# Patient Record
Sex: Female | Born: 1940 | Race: White | Hispanic: No | Smoking: Former smoker
Health system: Southern US, Community
[De-identification: ages and names within clinical notes are randomized; demographics above are authoritative.]

## PROBLEM LIST (undated history)

## (undated) DIAGNOSIS — F329 Major depressive disorder, single episode, unspecified: Secondary | ICD-10-CM

## (undated) DIAGNOSIS — I10 Essential (primary) hypertension: Secondary | ICD-10-CM

## (undated) DIAGNOSIS — M1711 Unilateral primary osteoarthritis, right knee: Secondary | ICD-10-CM

## (undated) DIAGNOSIS — F32A Depression, unspecified: Secondary | ICD-10-CM

## (undated) DIAGNOSIS — R011 Cardiac murmur, unspecified: Secondary | ICD-10-CM

## (undated) DIAGNOSIS — R112 Nausea with vomiting, unspecified: Secondary | ICD-10-CM

## (undated) DIAGNOSIS — Z9889 Other specified postprocedural states: Secondary | ICD-10-CM

## (undated) DIAGNOSIS — Z96652 Presence of left artificial knee joint: Secondary | ICD-10-CM

## (undated) DIAGNOSIS — E785 Hyperlipidemia, unspecified: Secondary | ICD-10-CM

## (undated) DIAGNOSIS — M1990 Unspecified osteoarthritis, unspecified site: Secondary | ICD-10-CM

## (undated) HISTORY — DX: Unspecified osteoarthritis, unspecified site: M19.90

## (undated) HISTORY — PX: TONSILLECTOMY: SUR1361

## (undated) HISTORY — PX: JOINT REPLACEMENT: SHX530

## (undated) HISTORY — DX: Unilateral primary osteoarthritis, right knee: M17.11

## (undated) HISTORY — PX: REPLACEMENT TOTAL KNEE: SUR1224

## (undated) HISTORY — DX: Essential (primary) hypertension: I10

## (undated) HISTORY — DX: Presence of left artificial knee joint: Z96.652

## (undated) HISTORY — DX: Hyperlipidemia, unspecified: E78.5

---

## 1996-10-14 HISTORY — PX: CHOLECYSTECTOMY: SHX55

## 1998-10-14 HISTORY — PX: BREAST BIOPSY: SHX20

## 2001-10-14 DIAGNOSIS — Z96652 Presence of left artificial knee joint: Secondary | ICD-10-CM

## 2001-10-14 HISTORY — DX: Presence of left artificial knee joint: Z96.652

## 2011-05-31 ENCOUNTER — Ambulatory Visit (INDEPENDENT_AMBULATORY_CARE_PROVIDER_SITE_OTHER): Payer: Medicare Other | Admitting: Family Medicine

## 2011-05-31 ENCOUNTER — Encounter: Payer: Self-pay | Admitting: Family Medicine

## 2011-05-31 VITALS — BP 140/78 | HR 60 | Temp 98.6°F | Wt 172.0 lb

## 2011-05-31 DIAGNOSIS — Z96659 Presence of unspecified artificial knee joint: Secondary | ICD-10-CM

## 2011-05-31 DIAGNOSIS — F329 Major depressive disorder, single episode, unspecified: Secondary | ICD-10-CM

## 2011-05-31 DIAGNOSIS — Z0000 Encounter for general adult medical examination without abnormal findings: Secondary | ICD-10-CM

## 2011-05-31 DIAGNOSIS — I10 Essential (primary) hypertension: Secondary | ICD-10-CM

## 2011-05-31 DIAGNOSIS — M1990 Unspecified osteoarthritis, unspecified site: Secondary | ICD-10-CM

## 2011-05-31 MED ORDER — LISINOPRIL 40 MG PO TABS: 40.0000 mg | ORAL_TABLET | Freq: Every day | ORAL | Status: DC

## 2011-05-31 MED ORDER — ZOSTER VACCINE LIVE 19400 UNT/0.65ML ~~LOC~~ SOLR: 0.6500 mL | Freq: Once | SUBCUTANEOUS | Status: DC

## 2011-05-31 MED ORDER — CITALOPRAM HYDROBROMIDE 10 MG PO TABS: 10.0000 mg | ORAL_TABLET | Freq: Every day | ORAL | Status: DC

## 2011-05-31 MED ORDER — CELECOXIB 200 MG PO CAPS: 200.0000 mg | ORAL_CAPSULE | Freq: Every day | ORAL | Status: DC

## 2011-05-31 MED ORDER — AMOXICILLIN 500 MG PO CAPS: ORAL_CAPSULE | ORAL | Status: DC

## 2011-05-31 MED ORDER — DILTIAZEM HCL ER BEADS 300 MG PO CP24: 300.0000 mg | ORAL_CAPSULE | Freq: Every day | ORAL | Status: DC

## 2011-05-31 NOTE — Progress Notes (Signed)
  Subjective:     Kimberly Edwards is a 70 y.o. female and is here for a comprehensive physical exam. The patient reports no problems.  History   Social History  . Marital Status: Married    Spouse Name: N/A    Number of Children: N/A  . Years of Education: N/A   Occupational History  . retired Interior and spatial designer    Social History Main Topics  . Smoking status: Current Some Day Smoker -- 50 years    Types: Cigarettes  . Smokeless tobacco: Not on file  . Alcohol Use: 0.0 oz/week     <1  . Drug Use: No  . Sexually Active: Not Currently -- Female partner(s)   Other Topics Concern  . Not on file   Social History Narrative  . No narrative on file   No health maintenance topics applied.  The following portions of the patient's history were reviewed and updated as appropriate: allergies, current medications, past family history, past medical history, past social history, past surgical history and problem list.  Review of Systems  Review of Systems  Constitutional: Negative for activity change, appetite change and fatigue.  HENT: Negative for hearing loss, congestion, tinnitus and ear discharge.   dentist q30m Eyes: Negative for visual disturbance (see optho q1y -- vision corrected to 20/20 with glasses).  Respiratory: Negative for cough, chest tightness and shortness of breath.   Cardiovascular: Negative for chest pain, palpitations and leg swelling.  Gastrointestinal: Negative for abdominal pain, diarrhea, constipation and abdominal distention.  Genitourinary: Negative for urgency, frequency, decreased urine volume and difficulty urinating.  Musculoskeletal: Negative for back pain, arthralgias and gait problem.  Skin: Negative for color change, pallor and rash.  Neurological: Negative for dizziness, light-headedness, numbness and headaches.  Hematological: Negative for adenopathy. Does not bruise/bleed easily.  Psychiatric/Behavioral: Negative for suicidal ideas, confusion, sleep  disturbance, self-injury, dysphoric mood, decreased concentration and agitation.  Pt is able to read and write and can do all ADLs No risk for falling No abuse/ violence in home     Objective:    BP 140/78  Pulse 60  Temp(Src) 98.6 F (37 C) (Oral)  Wt 172 lb (78.019 kg)  SpO2 95% General appearance: alert, cooperative, appears stated age and no distress Head: Normocephalic, without obvious abnormality, atraumatic Eyes: conjunctivae/corneas clear. PERRL, EOM's intact. Fundi benign. Ears: normal TM's and external ear canals both ears Nose: Nares normal. Septum midline. Mucosa normal. No drainage or sinus tenderness. Throat: lips, mucosa, and tongue normal; teeth and gums normal Neck: no adenopathy, no JVD, supple, symmetrical, trachea midline and thyroid not enlarged, symmetric, no tenderness/mass/nodules Back: symmetric, no curvature. ROM normal. No CVA tenderness. Lungs: clear to auscultation bilaterally Breasts: deferred until pap is due in Dec Heart: S1, S2 normal and + murmur Abdomen: soft, non-tender; bowel sounds normal; no masses,  no organomegaly Pelvic: gyn Extremities: extremities normal, atraumatic, no cyanosis or edema Pulses: 2+ and symmetric Skin: Skin color, texture, turgor normal. No rashes or lesions Lymph nodes: Cervical, supraclavicular, and axillary nodes normal. Neurologic: Alert and oriented X 3, normal strength and tone. Normal symmetric reflexes. Normal coordination and gait    Assessment:    Healthy female exam.  OA-- f/u ortho HTN-- stable , con't meds Depression--stable con't meds   Plan:  ghm utd Check fasting labs   See After Visit Summary for Counseling Recommendations

## 2011-05-31 NOTE — Patient Instructions (Signed)

## 2011-06-13 ENCOUNTER — Other Ambulatory Visit: Payer: Self-pay | Admitting: Family Medicine

## 2011-06-13 DIAGNOSIS — M1990 Unspecified osteoarthritis, unspecified site: Secondary | ICD-10-CM

## 2011-06-13 DIAGNOSIS — I10 Essential (primary) hypertension: Secondary | ICD-10-CM

## 2011-06-13 DIAGNOSIS — Z96659 Presence of unspecified artificial knee joint: Secondary | ICD-10-CM

## 2011-06-13 DIAGNOSIS — Z0000 Encounter for general adult medical examination without abnormal findings: Secondary | ICD-10-CM

## 2011-06-14 ENCOUNTER — Other Ambulatory Visit (INDEPENDENT_AMBULATORY_CARE_PROVIDER_SITE_OTHER): Payer: Medicare Other

## 2011-06-14 DIAGNOSIS — I10 Essential (primary) hypertension: Secondary | ICD-10-CM

## 2011-06-14 LAB — CBC WITH DIFFERENTIAL/PLATELET
Basophils Absolute: 0.1 10*3/uL (ref 0.0–0.1)
Eosinophils Relative: 2.2 % (ref 0.0–5.0)
HCT: 43.1 % (ref 36.0–46.0)
Lymphs Abs: 3 10*3/uL (ref 0.7–4.0)
MCV: 88.4 fl (ref 78.0–100.0)
Monocytes Absolute: 0.5 10*3/uL (ref 0.1–1.0)
Platelets: 270 10*3/uL (ref 150.0–400.0)
RDW: 13.9 % (ref 11.5–14.6)

## 2011-06-14 LAB — HEPATIC FUNCTION PANEL
ALT: 13 U/L (ref 0–35)
AST: 18 U/L (ref 0–37)
Albumin: 3.7 g/dL (ref 3.5–5.2)
Total Protein: 7.2 g/dL (ref 6.0–8.3)

## 2011-06-14 LAB — BASIC METABOLIC PANEL
CO2: 26 mEq/L (ref 19–32)
Calcium: 9.2 mg/dL (ref 8.4–10.5)
GFR: 88.86 mL/min (ref >60.00)
Sodium: 141 mEq/L (ref 135–145)

## 2011-06-14 LAB — LIPID PANEL
Cholesterol: 173 mg/dL (ref 0–200)
HDL: 49.4 mg/dL (ref >39.00)
Total CHOL/HDL Ratio: 4
Triglycerides: 155 mg/dL — ABNORMAL HIGH (ref 0.0–149.0)

## 2011-06-14 NOTE — Progress Notes (Signed)
Labs only

## 2011-12-09 ENCOUNTER — Ambulatory Visit (INDEPENDENT_AMBULATORY_CARE_PROVIDER_SITE_OTHER): Payer: Medicare Other | Admitting: Family Medicine

## 2011-12-09 ENCOUNTER — Encounter: Payer: Self-pay | Admitting: Family Medicine

## 2011-12-09 VITALS — BP 134/80 | HR 81 | Temp 98.2°F | Ht 58.8 in | Wt 176.6 lb

## 2011-12-09 DIAGNOSIS — I10 Essential (primary) hypertension: Secondary | ICD-10-CM | POA: Insufficient documentation

## 2011-12-09 DIAGNOSIS — Z780 Asymptomatic menopausal state: Secondary | ICD-10-CM

## 2011-12-09 DIAGNOSIS — Z0000 Encounter for general adult medical examination without abnormal findings: Secondary | ICD-10-CM

## 2011-12-09 DIAGNOSIS — Z1239 Encounter for other screening for malignant neoplasm of breast: Secondary | ICD-10-CM

## 2011-12-09 DIAGNOSIS — E785 Hyperlipidemia, unspecified: Secondary | ICD-10-CM

## 2011-12-09 DIAGNOSIS — M1990 Unspecified osteoarthritis, unspecified site: Secondary | ICD-10-CM

## 2011-12-09 DIAGNOSIS — Z96659 Presence of unspecified artificial knee joint: Secondary | ICD-10-CM

## 2011-12-09 DIAGNOSIS — Z1231 Encounter for screening mammogram for malignant neoplasm of breast: Secondary | ICD-10-CM

## 2011-12-09 MED ORDER — CITALOPRAM HYDROBROMIDE 10 MG PO TABS: 10.0000 mg | ORAL_TABLET | Freq: Every day | ORAL | Status: DC

## 2011-12-09 MED ORDER — TRIAMTERENE-HCTZ 37.5-25 MG PO CAPS: 1.0000 | ORAL_CAPSULE | ORAL | Status: DC

## 2011-12-09 MED ORDER — LISINOPRIL 40 MG PO TABS: 40.0000 mg | ORAL_TABLET | Freq: Every day | ORAL | Status: DC

## 2011-12-09 MED ORDER — PRAVASTATIN SODIUM 20 MG PO TABS: 20.0000 mg | ORAL_TABLET | Freq: Every day | ORAL | Status: DC

## 2011-12-09 MED ORDER — AMOXICILLIN 500 MG PO CAPS: ORAL_CAPSULE | ORAL | Status: DC

## 2011-12-09 MED ORDER — CELECOXIB 200 MG PO CAPS: 200.0000 mg | ORAL_CAPSULE | Freq: Every day | ORAL | Status: DC

## 2011-12-09 MED ORDER — DILTIAZEM HCL ER BEADS 300 MG PO CP24: 300.0000 mg | ORAL_CAPSULE | Freq: Every day | ORAL | Status: DC

## 2011-12-09 NOTE — Assessment & Plan Note (Signed)
con't meds  Check labs 

## 2011-12-09 NOTE — Patient Instructions (Addendum)
Hypertension As your heart beats, it forces blood through your arteries. This force is your blood pressure. If the pressure is too high, it is called hypertension (HTN) or high blood pressure. HTN is dangerous because you may have it and not know it. High blood pressure may mean that your heart has to work harder to pump blood. Your arteries may be narrow or stiff. The extra work puts you at risk for heart disease, stroke, and other problems.  Blood pressure consists of two numbers, a higher number over a lower, 110/72, for example. It is stated as "110 over 72." The ideal is below 120 for the top number (systolic) and under 80 for the bottom (diastolic). Write down your blood pressure today. You should pay close attention to your blood pressure if you have certain conditions such as:  Heart failure.   Prior heart attack.   Diabetes   Chronic kidney disease.   Prior stroke.   Multiple risk factors for heart disease.  To see if you have HTN, your blood pressure should be measured while you are seated with your arm held at the level of the heart. It should be measured at least twice. A one-time elevated blood pressure reading (especially in the Emergency Department) does not mean that you need treatment. There may be conditions in which the blood pressure is different between your right and left arms. It is important to see your caregiver soon for a recheck. Most people have essential hypertension which means that there is not a specific cause. This type of high blood pressure may be lowered by changing lifestyle factors such as:  Stress.   Smoking.   Lack of exercise.   Excessive weight.   Drug/tobacco/alcohol use.   Eating less salt.  Most people do not have symptoms from high blood pressure until it has caused damage to the body. Effective treatment can often prevent, delay or reduce that damage. TREATMENT  When a cause has been identified, treatment for high blood pressure is  directed at the cause. There are a large number of medications to treat HTN. These fall into several categories, and your caregiver will help you select the medicines that are best for you. Medications may have side effects. You should review side effects with your caregiver. If your blood pressure stays high after you have made lifestyle changes or started on medicines,   Your medication(s) may need to be changed.   Other problems may need to be addressed.   Be certain you understand your prescriptions, and know how and when to take your medicine.   Be sure to follow up with your caregiver within the time frame advised (usually within two weeks) to have your blood pressure rechecked and to review your medications.   If you are taking more than one medicine to lower your blood pressure, make sure you know how and at what times they should be taken. Taking two medicines at the same time can result in blood pressure that is too low.  SEEK IMMEDIATE MEDICAL CARE IF:  You develop a severe headache, blurred or changing vision, or confusion.   You have unusual weakness or numbness, or a faint feeling.   You have severe chest or abdominal pain, vomiting, or breathing problems.  MAKE SURE YOU:   Understand these instructions.   Will watch your condition.   Will get help right away if you are not doing well or get worse.  Document Released: 09/30/2005 Document Revised: 06/12/2011 Document Reviewed:   05/20/2008 Exit[employer] Patient Information 2012 [employer].Smoking Cessation This document explains the best ways for you to quit smoking and new treatments to help. It lists new medicines that can double or triple your chances of quitting and quitting for good. It also considers ways to avoid relapses and concerns you may have about quitting, including weight gain. NICOTINE: A POWERFUL ADDICTION If you have tried to quit smoking, you know how hard it can be. It is hard because nicotine is a very  addictive drug. For some people, it can be as addictive as heroin or cocaine. Usually, people make 2 or 3 tries, or more, before finally being able to quit. Each time you try to quit, you can learn about what helps and what hurts. Quitting takes hard work and a lot of effort, but you can quit smoking. QUITTING SMOKING IS ONE OF THE MOST IMPORTANT THINGS YOU WILL EVER DO.  You will live longer, feel better, and live better.   The impact on your body of quitting smoking is felt almost immediately:   Within 20 minutes, blood pressure decreases. Pulse returns to its normal level.   After 8 hours, carbon monoxide levels in the blood return to normal. Oxygen level increases.   After 24 hours, chance of heart attack starts to decrease. Breath, hair, and body stop smelling like smoke.   After 48 hours, damaged nerve endings begin to recover. Sense of taste and smell improve.   After 72 hours, the body is virtually free of nicotine. Bronchial tubes relax and breathing becomes easier.   After 2 to 12 weeks, lungs can hold more air. Exercise becomes easier and circulation improves.   Quitting will reduce your risk of having a heart attack, stroke, cancer, or lung disease:   After 1 year, the risk of coronary heart disease is cut in half.   After 5 years, the risk of stroke falls to the same as a nonsmoker.   After 10 years, the risk of lung cancer is cut in half and the risk of other cancers decreases significantly.   After 15 years, the risk of coronary heart disease drops, usually to the level of a nonsmoker.   If you are pregnant, quitting smoking will improve your chances of having a healthy baby.   The people you live with, especially your children, will be healthier.   You will have extra money to spend on things other than cigarettes.  FIVE KEYS TO QUITTING Studies have shown that these 5 steps will help you quit smoking and quit for good. You have the best chances of quitting if you  use them together: 1. Get ready.  2. Get support and encouragement.  3. Learn new skills and behaviors.  4. Get medicine to reduce your nicotine addiction and use it correctly.  5. Be prepared for relapse or difficult situations. Be determined to continue trying to quit, even if you do not succeed at first.  1. GET READY  Set a quit date.   Change your environment.   Get rid of ALL cigarettes, ashtrays, matches, and lighters in your home, car, and place of work.   Do not let people smoke in your home.   Review your past attempts to quit. Think about what worked and what did not.   Once you quit, do not smoke. NOT EVEN A PUFF!  2. GET SUPPORT AND ENCOURAGEMENT Studies have shown that you have a better chance of being successful if you have help. You can   get support in many ways.  Tell your family, friends, and coworkers that you are going to quit and need their support. Ask them not to smoke around you.   Talk to your caregivers (doctor, dentist, nurse, pharmacist, psychologist, and/or smoking counselor).   Get individual, group, or telephone counseling and support. The more counseling you have, the better your chances are of quitting. Programs are available at local hospitals and health centers. Call your local health department for information about programs in your area.   Spiritual beliefs and practices may help some smokers quit.   Quit meters are small computer programs online or downloadable that keep track of quit statistics, such as amount of "quit-time," cigarettes not smoked, and money saved.   Many smokers find one or more of the many self-help books available useful in helping them quit and stay off tobacco.  3. LEARN NEW SKILLS AND BEHAVIORS  Try to distract yourself from urges to smoke. Talk to someone, go for a walk, or occupy your time with a task.   When you first try to quit, change your routine. Take a different route to work. Drink tea instead of coffee. Eat  breakfast in a different place.   Do something to reduce your stress. Take a hot bath, exercise, or read a book.   Plan something enjoyable to do every day. Reward yourself for not smoking.   Explore interactive web-based programs that specialize in helping you quit.  4. GET MEDICINE AND USE IT CORRECTLY Medicines can help you stop smoking and decrease the urge to smoke. Combining medicine with the above behavioral methods and support can quadruple your chances of successfully quitting smoking. The [place] Food and Drug Administration (FDA) has approved 7 medicines to help you quit smoking. These medicines fall into 3 categories.  Nicotine replacement therapy (delivers nicotine to your body without the negative effects and risks of smoking):   Nicotine gum: Available over-the-counter.   Nicotine lozenges: Available over-the-counter.   Nicotine inhaler: Available by prescription.   Nicotine nasal spray: Available by prescription.   Nicotine skin patches (transdermal): Available by prescription and over-the-counter.   Antidepressant medicine (helps people abstain from smoking, but how this works is unknown):   Bupropion sustained-release (SR) tablets: Available by prescription.   Nicotinic receptor partial agonist (simulates the effect of nicotine in your brain):   Varenicline tartrate tablets: Available by prescription.   Ask your caregiver for advice about which medicines to use and how to use them. Carefully read the information on the package.   Everyone who is trying to quit may benefit from using a medicine. If you are pregnant or trying to become pregnant, nursing an infant, you are under age 18, or you smoke fewer than 10 cigarettes per day, talk to your caregiver before taking any nicotine replacement medicines.   You should stop using a nicotine replacement product and call your caregiver if you experience nausea, dizziness, weakness, vomiting, fast or irregular heartbeat,  mouth problems with the lozenge or gum, or redness or swelling of the skin around the patch that does not go away.   Do not use any other product containing nicotine while using a nicotine replacement product.   Talk to your caregiver before using these products if you have diabetes, heart disease, asthma, stomach ulcers, you had a recent heart attack, you have high blood pressure that is not controlled with medicine, a history of irregular heartbeat, or you have been prescribed medicine to help you quit smoking.    5. BE PREPARED FOR RELAPSE OR DIFFICULT SITUATIONS  Most relapses occur within the first 3 months after quitting. Do not be discouraged if you start smoking again. Remember, most people try several times before they finally quit.   You may have symptoms of withdrawal because your body is used to nicotine. You may crave cigarettes, be irritable, feel very hungry, cough often, get headaches, or have difficulty concentrating.   The withdrawal symptoms are only temporary. They are strongest when you first quit, but they will go away within 10 to 14 days.  Here are some difficult situations to watch for:  Alcohol. Avoid drinking alcohol. Drinking lowers your chances of successfully quitting.   Caffeine. Try to reduce the amount of caffeine you consume. It also lowers your chances of successfully quitting.   Other smokers. Being around smoking can make you want to smoke. Avoid smokers.   Weight gain. Many smokers will gain weight when they quit, usually less than 10 pounds. Eat a healthy diet and stay active. Do not let weight gain distract you from your main goal, quitting smoking. Some medicines that help you quit smoking may also help delay weight gain. You can always lose the weight gained after you quit.   Bad mood or depression. There are a lot of ways to improve your mood other than smoking.  If you are having problems with any of these situations, talk to your caregiver. SPECIAL  SITUATIONS AND CONDITIONS Studies suggest that everyone can quit smoking. Your situation or condition can give you a special reason to quit.  Pregnant women/new mothers: By quitting, you protect your baby's health and your own.   Hospitalized patients: By quitting, you reduce health problems and help healing.   Heart attack patients: By quitting, you reduce your risk of a second heart attack.   Lung, head, and neck cancer patients: By quitting, you reduce your chance of a second cancer.   Parents of children and adolescents: By quitting, you protect your children from illnesses caused by secondhand smoke.  QUESTIONS TO THINK ABOUT Think about the following questions before you try to stop smoking. You may want to talk about your answers with your caregiver.  Why do you want to quit?   If you tried to quit in the past, what helped and what did not?   What will be the most difficult situations for you after you quit? How will you plan to handle them?   Who can help you through the tough times? Your family? Friends? Caregiver?   What pleasures do you get from smoking? What ways can you still get pleasure if you quit?  Here are some questions to ask your caregiver:  How can you help me to be successful at quitting?   What medicine do you think would be best for me and how should I take it?   What should I do if I need more help?   What is smoking withdrawal like? How can I get information on withdrawal?  Quitting takes hard work and a lot of effort, but you can quit smoking. FOR MORE INFORMATION  Smokefree.gov (http://www.smokefree.gov) provides free, accurate, evidence-based information and professional assistance to help support the immediate and long-term needs of people trying to quit smoking. Document Released: 09/24/2001 Document Revised: 06/12/2011 Document Reviewed: 07/17/2009 [employer] Patient Information 2012 [employer]. 

## 2011-12-09 NOTE — Progress Notes (Signed)
  Subjective:    Patient ID: Kimberly Edwards, female    DOB: [birth year], 71 y.o.   MRN: [id number]  HPI Pt here for f/u bp and cholesterol.  Pt has no complaints.    Review of Systems As above    Objective:   Physical Exam  Constitutional: She is oriented to person, place, and time. She appears well-developed and well-nourished.  Neck: Normal range of motion. Neck supple.  Cardiovascular: Normal rate, regular rhythm and normal heart sounds.   No murmur heard. Pulmonary/Chest: Effort normal and breath sounds normal. No respiratory distress. She has no wheezes. She has no rales.  Musculoskeletal: She exhibits no edema and no tenderness.  Neurological: She is alert and oriented to person, place, and time.  Psychiatric: She has a normal mood and affect. Her behavior is normal. Judgment and thought content normal.          Assessment & Plan:

## 2011-12-09 NOTE — Assessment & Plan Note (Signed)
Stable , con't meds  

## 2011-12-24 ENCOUNTER — Other Ambulatory Visit (INDEPENDENT_AMBULATORY_CARE_PROVIDER_SITE_OTHER): Payer: Medicare Other

## 2011-12-24 DIAGNOSIS — I10 Essential (primary) hypertension: Secondary | ICD-10-CM

## 2011-12-24 DIAGNOSIS — Z0000 Encounter for general adult medical examination without abnormal findings: Secondary | ICD-10-CM

## 2011-12-24 DIAGNOSIS — E785 Hyperlipidemia, unspecified: Secondary | ICD-10-CM

## 2011-12-24 LAB — LIPID PANEL
Cholesterol: 206 mg/dL — ABNORMAL HIGH (ref 0–200)
HDL: 66.5 mg/dL (ref >39.00)
VLDL: 23.8 mg/dL (ref 0.0–40.0)

## 2011-12-24 LAB — BASIC METABOLIC PANEL
BUN: 29 mg/dL — ABNORMAL HIGH (ref 6–23)
Chloride: 103 mEq/L (ref 96–112)
GFR: 70.71 mL/min (ref >60.00)
Glucose, Bld: 88 mg/dL (ref 70–99)
Potassium: 4.2 mEq/L (ref 3.5–5.1)

## 2011-12-24 LAB — HEPATIC FUNCTION PANEL
Albumin: 3.6 g/dL (ref 3.5–5.2)
Total Protein: 7 g/dL (ref 6.0–8.3)

## 2011-12-25 LAB — VARICELLA ZOSTER ANTIBODY, IGG: Varicella IgG: 3.52 {ISR} — ABNORMAL HIGH

## 2011-12-30 DIAGNOSIS — E785 Hyperlipidemia, unspecified: Secondary | ICD-10-CM

## 2011-12-30 MED ORDER — PRAVASTATIN SODIUM 40 MG PO TABS: 40.0000 mg | ORAL_TABLET | Freq: Every day | ORAL | Status: DC

## 2011-12-31 ENCOUNTER — Ambulatory Visit
Admission: RE | Admit: 2011-12-31 | Discharge: 2011-12-31 | Disposition: A | Discharge status: Home / Self Care | Payer: Medicare Other | Source: Ambulatory Visit | Attending: Family Medicine | Admitting: Family Medicine

## 2011-12-31 DIAGNOSIS — Z780 Asymptomatic menopausal state: Secondary | ICD-10-CM

## 2011-12-31 DIAGNOSIS — Z1231 Encounter for screening mammogram for malignant neoplasm of breast: Secondary | ICD-10-CM

## 2011-12-31 LAB — HM DEXA SCAN: HM Dexa Scan: NORMAL

## 2012-02-17 ENCOUNTER — Telehealth: Payer: Self-pay | Admitting: Family Medicine

## 2012-02-17 NOTE — Telephone Encounter (Signed)
Pt states she is currently walking with a cane and walker and is scheduled to have her knee surgery in June. She is requesting a handicap sticker. Does she need an OV?

## 2012-02-17 NOTE — Telephone Encounter (Signed)
Patient aware form complete and she will pick up in the morning.    KP

## 2012-02-17 NOTE — Telephone Encounter (Signed)
No we can give her application

## 2012-02-21 ENCOUNTER — Encounter: Payer: Self-pay | Admitting: Family Medicine

## 2012-03-04 ENCOUNTER — Ambulatory Visit (INDEPENDENT_AMBULATORY_CARE_PROVIDER_SITE_OTHER): Payer: Medicare Other | Admitting: Family Medicine

## 2012-03-04 ENCOUNTER — Encounter: Payer: Self-pay | Admitting: Family Medicine

## 2012-03-04 VITALS — BP 138/72 | HR 65 | Temp 98.9°F | Wt 175.0 lb

## 2012-03-04 DIAGNOSIS — E785 Hyperlipidemia, unspecified: Secondary | ICD-10-CM

## 2012-03-04 DIAGNOSIS — I10 Essential (primary) hypertension: Secondary | ICD-10-CM

## 2012-03-04 DIAGNOSIS — Z01818 Encounter for other preprocedural examination: Secondary | ICD-10-CM

## 2012-03-04 NOTE — Progress Notes (Signed)
Subjective:    Kimberly Edwards is a 71 y.o. female who presents to the office today for a preoperative consultation at the request of surgeon Dr Thurston Hole who plans on performing R total knee replacement on June 10. This consultation is requested for the specific conditions prompting preoperative evaluation (i.e. because of potential affect on operative risk): htn, hyperlipidemia and age. Planned anesthesia: general. The patient has the following known anesthesia issues: none. Patients bleeding risk: use of Ca-channel blockers (see med list). Patient does not have objections to receiving blood products if needed.  The following portions of the patient's history were reviewed and updated as appropriate: allergies, current medications, past family history, past medical history, past social history, past surgical history and problem list.  Review of Systems Pertinent items are noted in HPI.    Objective:    BP 138/72  Pulse 65  Temp(Src) 98.9 F (37.2 C) (Oral)  Wt 175 lb (79.379 kg)  SpO2 96% General appearance: alert, cooperative, appears stated age and no distress Head: Normocephalic, without obvious abnormality, atraumatic Eyes: conjunctivae/corneas clear. PERRL, EOM&#39;s intact. Fundi benign. Ears: normal TM's and external ear canals both ears Nose: Nares normal. Septum midline. Mucosa normal. No drainage or sinus tenderness. Throat: lips, mucosa, and tongue normal; teeth and gums normal Neck: no adenopathy, no carotid bruit, no JVD, supple, symmetrical, trachea midline and thyroid not enlarged, symmetric, no tenderness/mass/nodules Back: negative Lungs: clear to auscultation bilaterally Heart: regular rate and rhythm, S1, S2 normal, no murmur, click, rub or gallop Abdomen: soft, non-tender; bowel sounds normal; no masses,  no organomegaly Extremities: + swelling in R knee with pain and crepitus Pulses: 2+ and symmetric Skin: Skin color, texture, turgor normal. No rashes or  lesions Lymph nodes: Cervical, supraclavicular, and axillary nodes normal. Neurologic: Alert and oriented X 3, normal strength and tone. Normal symmetric reflexes. Normal coordination and gait  Predictors of intubation difficulty:  Morbid obesity? yes -   Anatomically abnormal facies? no  Prominent incisors? no  Receding mandible? no  Short, thick neck? no  Neck range of motion: normal     Dentition: No chipped, loose, or missing teeth.---+ permanent bridges  Cardiographics ECG: sinus brady Echocardiogram: not done  Imaging Chest x-ray: to be done at hospital   Lab Review  Appointment on 12/24/2011  Component Date Value  . Varicella IgG 12/24/2011 3.52*  . Total Bilirubin 12/24/2011 0.6   . Bilirubin, Direct 12/24/2011 0.1   . Alkaline Phosphatase 12/24/2011 66   . AST 12/24/2011 16   . ALT 12/24/2011 16   . Total Protein 12/24/2011 7.0   . Albumin 12/24/2011 3.6   . Cholesterol 12/24/2011 206*  . Triglycerides 12/24/2011 119.0   . HDL 12/24/2011 66.50   . VLDL 12/24/2011 23.8   . Total CHOL/HDL Ratio 12/24/2011 3   . Sodium 12/24/2011 140   . Potassium 12/24/2011 4.2   . Chloride 12/24/2011 103   . CO2 12/24/2011 29   . Glucose, Bld 12/24/2011 88   . BUN 12/24/2011 29*  . Creatinine, Ser 12/24/2011 0.8   . Calcium 12/24/2011 9.2   . GFR 12/24/2011 70.71   . Direct LDL 12/24/2011 111.1       Assessment:      71 y.o. female with planned surgery as above.   Known risk factors for perioperative complications: None   Difficulty with intubation is not anticipated.  Cardiac Risk Estimation: low  Current medications which may produce withdrawal symptoms if withheld perioperatively: none  Plan:    1. Preoperative workup as follows chest x-ray, ECG, hemoglobin, hematocrit, electrolytes, creatinine, glucose, liver function studies, coagulation studies. 2. Change in medication regimen before surgery: stop aspirin 1 week before surgery,  no alcohol 2 days before,   stop fish oil and she will take all other meds with her to hospital--per pt. 3. Prophylaxis for cardiac events with perioperative beta-blockers:per anasthesia 4. Invasive hemodynamic monitoring perioperatively: at the discretion of anesthesiologist. 5. Deep vein thrombosis prophylaxis postoperatively:regimen to be chosen by surgical team. 6. Surveillance for postoperative MI with ECG immediately postoperatively and on postoperative days 1 and 2 AND troponin levels 24 hours postoperatively and on day 4 or hospital discharge (whichever comes first): at the discretion of anesthesiologist. 7. Other measures: consultation of hospital service if medicine referral is needed

## 2012-03-06 ENCOUNTER — Ambulatory Visit: Payer: Medicare Other | Admitting: Family Medicine

## 2012-03-10 ENCOUNTER — Other Ambulatory Visit: Payer: Self-pay | Admitting: Physician Assistant

## 2012-03-10 ENCOUNTER — Encounter: Payer: Self-pay | Admitting: Physician Assistant

## 2012-03-10 DIAGNOSIS — Z96652 Presence of left artificial knee joint: Secondary | ICD-10-CM

## 2012-03-10 DIAGNOSIS — I10 Essential (primary) hypertension: Secondary | ICD-10-CM

## 2012-03-10 DIAGNOSIS — M1711 Unilateral primary osteoarthritis, right knee: Secondary | ICD-10-CM

## 2012-03-10 DIAGNOSIS — M1990 Unspecified osteoarthritis, unspecified site: Secondary | ICD-10-CM

## 2012-03-10 NOTE — H&P (Signed)
Kimberly Edwards is an 71 y.o. female.   Chief Complaint: right knee DJD HPI: Kimberly Edwards is seen for increasing right knee pain where she has known degenerative joint disease. She was scheduled for a total knee replacement in July but her pain has gotten to the point that she can barely walk. We saw her 2 weeks ago and discussed the need for total knee replacement because she has no remaining joint space in her right knee on x-rays and she now needs an assistive device to walk. She has an appointment with her primary care physician Dr. Lowne coming up. Her pain is severe and her gait is severely compromised due to this.   Past Medical History  Diagnosis Date  . Hypertension   . Hyperlipidemia   . Arthritis   . Right knee DJD   . Knee joint replacement status, left 2003    Past Surgical History  Procedure Date  . Cholecystectomy 1998  . Replacement total knee     left knee  . Breast biopsy 2000    marker put in    Family History  Problem Relation Age of Onset  . Breast cancer Sister   . Hypertension Father    Social History:  reports that she has been smoking Cigarettes.  She has a 12.5 pack-year smoking history. She does not have any smokeless tobacco history on file. She reports that she drinks alcohol. She reports that she does not use illicit drugs.  Allergies: No Known Allergies  Current Outpatient Prescriptions on File Prior to Visit  Medication Sig Dispense Refill  . aspirin 81 MG EC tablet Take 81 mg by mouth daily.        . celecoxib (CELEBREX) 200 MG capsule Take 1 capsule (200 mg total) by mouth daily.  30 capsule  11  . citalopram (CELEXA) 10 MG tablet Take 1 tablet (10 mg total) by mouth daily.  30 tablet  11  . diltiazem (TAZTIA XT) 300 MG 24 hr capsule Take 1 capsule (300 mg total) by mouth daily.  30 capsule  5  . Glucosamine HCl 1500 MG TABS Take 2 tablets by mouth daily.       . lisinopril (PRINIVIL,ZESTRIL) 40 MG tablet Take 1 tablet (40 mg total) by mouth daily.   30 tablet  5  . pravastatin (PRAVACHOL) 40 MG tablet Take 1 tablet (40 mg total) by mouth daily.  30 tablet  2  . triamterene-hydrochlorothiazide (DYAZIDE) 37.5-25 MG per capsule Take 1 each (1 capsule total) by mouth every morning.  30 capsule  5  . amoxicillin (AMOXIL) 500 MG capsule As directed prior to dental cleaning  30 capsule  0  . zoster vaccine live, PF, (ZOSTAVAX) 19400 UNT/0.65ML injection Inject 19,400 Units into the skin once.  1 vial  0    No results found for this or any previous visit (from the past 48 hour(s)). No results found.  Review of Systems  Constitutional: Negative.   HENT: Negative.   Eyes: Negative.   Respiratory: Negative.   Cardiovascular: Negative.   Gastrointestinal: Negative.   Genitourinary: Negative.   Musculoskeletal: Positive for joint pain.       Right knee  Skin: Negative.   Neurological: Negative.   Endo/Heme/Allergies: Negative.   Psychiatric/Behavioral: Negative.     Blood pressure 120/76, pulse 65, temperature 98.4 F (36.9 C), height 4' 10" (1.473 m), weight 81.194 kg (179 lb), SpO2 94.00%. Physical Exam  Constitutional: She is oriented to person, place, and time. She   appears well-developed and well-nourished.  HENT:  Head: Normocephalic and atraumatic.  Mouth/Throat: Oropharynx is clear and moist.  Eyes: EOM are normal. Pupils are equal, round, and reactive to light.  Neck: Normal range of motion. Neck supple.  Cardiovascular: Normal rate and regular rhythm.   Respiratory: Effort normal.  GI: Soft.  Musculoskeletal:       Height 4'10" weight 175 pounds. Respirations are 12 and unlabored. Examination of her right knee reveals valgus deformity with 1 to 2+ effusion, range of motion -5 to 115 degrees with significant pain no instability with normal patella tracking. Left knee reveals well healed total knee incision without swelling or pain, range of motion 0-115 degrees knee is stable with normal patella tracking.   Neurological: She  is alert and oriented to person, place, and time.  Skin: Skin is warm and dry.  Psychiatric: She has a normal mood and affect. Her behavior is normal. Judgment and thought content normal.    X-RAYS: X-rays of her right knee standing AP lateral flexion and sunrise views show significant joint space narrowing with no joint space remaining in the lateral compartment with significant lateral erosion and medial compartment loss of all joint space as well with significant valgus deformity.  Assessment Patient Active Problem List  Diagnoses  . Osteoarthritis  . HTN (hypertension)  . Hyperlipidemia  . Hypertension  . Arthritis  . Knee joint replacement status, left  . Right knee DJD    Plan I talk to her about this in detail. She has failed conservative care. She needs assistive devices to walk and we give her a prescription for a walker and a cane. Place her in a J&J brace. She has preoperative clearance from Dr. Lowne. Discussed risks benefits and possible complications of the surgery in detail and she understands this completely.   Kimberly Edwards J 03/10/2012, 3:31 PM    

## 2012-03-11 ENCOUNTER — Inpatient Hospital Stay (HOSPITAL_COMMUNITY): Admission: RE | Admit: 2012-03-11 | Discharge: 2012-03-11 | Payer: Medicare Other | Source: Ambulatory Visit

## 2012-03-11 ENCOUNTER — Encounter (HOSPITAL_COMMUNITY): Payer: Self-pay

## 2012-03-11 HISTORY — DX: Other specified postprocedural states: Z98.890

## 2012-03-11 HISTORY — DX: Depression, unspecified: F32.A

## 2012-03-11 HISTORY — DX: Major depressive disorder, single episode, unspecified: F32.9

## 2012-03-11 HISTORY — DX: Nausea with vomiting, unspecified: R11.2

## 2012-03-11 HISTORY — DX: Cardiac murmur, unspecified: R01.1

## 2012-03-12 ENCOUNTER — Encounter (HOSPITAL_COMMUNITY): Payer: Self-pay | Admitting: Pharmacist

## 2012-03-16 ENCOUNTER — Ambulatory Visit: Payer: Medicare Other | Admitting: Family Medicine

## 2012-03-17 ENCOUNTER — Encounter (HOSPITAL_COMMUNITY)
Admission: RE | Admit: 2012-03-17 | Discharge: 2012-03-17 | Disposition: A | Discharge status: Home / Self Care | Payer: Medicare Other | Source: Ambulatory Visit | Attending: Orthopedic Surgery | Admitting: Orthopedic Surgery

## 2012-03-17 ENCOUNTER — Encounter (HOSPITAL_COMMUNITY)
Admission: RE | Admit: 2012-03-17 | Discharge: 2012-03-17 | Disposition: A | Discharge status: Home / Self Care | Payer: Medicare Other | Source: Ambulatory Visit | Attending: Physician Assistant | Admitting: Physician Assistant

## 2012-03-17 LAB — DIFFERENTIAL
Lymphs Abs: 3.7 10*3/uL (ref 0.7–4.0)
Monocytes Relative: 7 % (ref 3–12)
Neutro Abs: 7.1 10*3/uL (ref 1.7–7.7)
Neutrophils Relative %: 60 % (ref 43–77)

## 2012-03-17 LAB — PROTIME-INR
INR: 0.98 (ref 0.00–1.49)
Prothrombin Time: 13.2 seconds (ref 11.6–15.2)

## 2012-03-17 LAB — COMPREHENSIVE METABOLIC PANEL
Albumin: 3.6 g/dL (ref 3.5–5.2)
BUN: 22 mg/dL (ref 6–23)
Calcium: 9.6 mg/dL (ref 8.4–10.5)
Creatinine, Ser: 0.73 mg/dL (ref 0.50–1.10)
GFR calc Af Amer: >90 mL/min (ref >90)
Glucose, Bld: 93 mg/dL (ref 70–99)
Total Protein: 7.5 g/dL (ref 6.0–8.3)

## 2012-03-17 LAB — URINALYSIS, ROUTINE W REFLEX MICROSCOPIC
Bilirubin Urine: NEGATIVE
Glucose, UA: NEGATIVE mg/dL
Ketones, ur: NEGATIVE mg/dL
Nitrite: POSITIVE — AB
Protein, ur: NEGATIVE mg/dL
pH: 6.5 (ref 5.0–8.0)

## 2012-03-17 LAB — URINE MICROSCOPIC-ADD ON

## 2012-03-17 LAB — TYPE AND SCREEN: ABO/RH(D): B POS

## 2012-03-17 LAB — CBC
HCT: 44 % (ref 36.0–46.0)
MCH: 29.7 pg (ref 26.0–34.0)
MCHC: 33.6 g/dL (ref 30.0–36.0)
MCV: 88.2 fL (ref 78.0–100.0)
RDW: 13.2 % (ref 11.5–15.5)

## 2012-03-17 LAB — APTT: aPTT: 32 seconds (ref 24–37)

## 2012-03-17 MED ORDER — CHLORHEXIDINE GLUCONATE 4 % EX LIQD: 60.0000 mL | Freq: Once | CUTANEOUS | Status: DC

## 2012-03-19 ENCOUNTER — Other Ambulatory Visit: Payer: Self-pay | Admitting: Family Medicine

## 2012-03-19 LAB — URINE CULTURE
Colony Count: >=100000
Culture  Setup Time: 201306041224

## 2012-03-22 MED ORDER — CEFAZOLIN SODIUM-DEXTROSE 2-3 GM-% IV SOLR
2.0000 g | INTRAVENOUS | Status: AC
  Administered 2012-03-23: 2 g via INTRAVENOUS

## 2012-03-23 ENCOUNTER — Encounter (HOSPITAL_COMMUNITY): Payer: Self-pay | Admitting: *Deleted

## 2012-03-23 ENCOUNTER — Inpatient Hospital Stay (HOSPITAL_COMMUNITY)
Admission: RE | Admit: 2012-03-23 | Discharge: 2012-03-25 | DRG: 470 | Disposition: A | Discharge status: Skilled Nursing Facility | Payer: Medicare Other | Source: Ambulatory Visit | Attending: Orthopedic Surgery | Admitting: Orthopedic Surgery

## 2012-03-23 ENCOUNTER — Encounter (HOSPITAL_COMMUNITY): Payer: Self-pay | Admitting: Certified Registered"

## 2012-03-23 ENCOUNTER — Encounter (HOSPITAL_COMMUNITY)
Admission: RE | Disposition: A | Discharge status: Skilled Nursing Facility | Payer: Self-pay | Source: Ambulatory Visit | Attending: Orthopedic Surgery

## 2012-03-23 ENCOUNTER — Ambulatory Visit (HOSPITAL_COMMUNITY): Payer: Medicare Other | Admitting: Certified Registered"

## 2012-03-23 DIAGNOSIS — M1990 Unspecified osteoarthritis, unspecified site: Secondary | ICD-10-CM | POA: Insufficient documentation

## 2012-03-23 DIAGNOSIS — Z7901 Long term (current) use of anticoagulants: Secondary | ICD-10-CM

## 2012-03-23 DIAGNOSIS — Z01812 Encounter for preprocedural laboratory examination: Secondary | ICD-10-CM

## 2012-03-23 DIAGNOSIS — A498 Other bacterial infections of unspecified site: Secondary | ICD-10-CM | POA: Diagnosis present

## 2012-03-23 DIAGNOSIS — M1711 Unilateral primary osteoarthritis, right knee: Secondary | ICD-10-CM | POA: Insufficient documentation

## 2012-03-23 DIAGNOSIS — E785 Hyperlipidemia, unspecified: Secondary | ICD-10-CM | POA: Insufficient documentation

## 2012-03-23 DIAGNOSIS — Z96659 Presence of unspecified artificial knee joint: Secondary | ICD-10-CM

## 2012-03-23 DIAGNOSIS — M1710 Unilateral primary osteoarthritis, unspecified knee: Principal | ICD-10-CM | POA: Diagnosis present

## 2012-03-23 DIAGNOSIS — D62 Acute posthemorrhagic anemia: Secondary | ICD-10-CM | POA: Diagnosis not present

## 2012-03-23 DIAGNOSIS — R112 Nausea with vomiting, unspecified: Secondary | ICD-10-CM

## 2012-03-23 DIAGNOSIS — I10 Essential (primary) hypertension: Secondary | ICD-10-CM | POA: Insufficient documentation

## 2012-03-23 DIAGNOSIS — Z79899 Other long term (current) drug therapy: Secondary | ICD-10-CM

## 2012-03-23 DIAGNOSIS — F17200 Nicotine dependence, unspecified, uncomplicated: Secondary | ICD-10-CM | POA: Diagnosis present

## 2012-03-23 DIAGNOSIS — N390 Urinary tract infection, site not specified: Secondary | ICD-10-CM | POA: Diagnosis present

## 2012-03-23 HISTORY — PX: TOTAL KNEE ARTHROPLASTY: SHX125

## 2012-03-23 SURGERY — ARTHROPLASTY, KNEE, TOTAL
Anesthesia: Regional | Site: Knee | Laterality: Right | Wound class: Clean

## 2012-03-23 MED ORDER — ACETAMINOPHEN 650 MG RE SUPP: 650.0000 mg | Freq: Four times a day (QID) | RECTAL | Status: DC | PRN

## 2012-03-23 MED ORDER — ONDANSETRON HCL 4 MG/2ML IJ SOLN
4.0000 mg | Freq: Four times a day (QID) | INTRAMUSCULAR | Status: DC | PRN
  Administered 2012-03-24: 4 mg via INTRAVENOUS

## 2012-03-23 MED ORDER — HYDROMORPHONE HCL PF 1 MG/ML IJ SOLN
0.2500 mg | INTRAMUSCULAR | Status: DC | PRN
  Administered 2012-03-23 (×4): 0.5 mg via INTRAVENOUS

## 2012-03-23 MED ORDER — BUPIVACAINE-EPINEPHRINE 0.25% -1:200000 IJ SOLN
INTRAMUSCULAR | Status: DC | PRN
  Administered 2012-03-23: 30 mL

## 2012-03-23 MED ORDER — DILTIAZEM HCL ER BEADS 300 MG PO CP24
300.0000 mg | ORAL_CAPSULE | Freq: Every day | ORAL | Status: DC
  Administered 2012-03-24 – 2012-03-25 (×2): 300 mg via ORAL
  Filled 2012-03-23 (×2): qty 1

## 2012-03-23 MED ORDER — DROPERIDOL 2.5 MG/ML IJ SOLN
INTRAMUSCULAR | Status: DC | PRN
  Administered 2012-03-23: 0.625 mg via INTRAVENOUS

## 2012-03-23 MED ORDER — METOCLOPRAMIDE HCL 10 MG PO TABS: 5.0000 mg | ORAL_TABLET | Freq: Three times a day (TID) | ORAL | Status: DC | PRN

## 2012-03-23 MED ORDER — BUPIVACAINE-EPINEPHRINE PF 0.5-1:200000 % IJ SOLN
INTRAMUSCULAR | Status: DC | PRN
  Administered 2012-03-23: 30 mL

## 2012-03-23 MED ORDER — PHENOL 1.4 % MT LIQD: 1.0000 | OROMUCOSAL | Status: DC | PRN

## 2012-03-23 MED ORDER — LACTATED RINGERS IV SOLN: INTRAVENOUS | Status: DC

## 2012-03-23 MED ORDER — MIDAZOLAM HCL 5 MG/5ML IJ SOLN
INTRAMUSCULAR | Status: DC | PRN
  Administered 2012-03-23: 2 mg via INTRAVENOUS

## 2012-03-23 MED ORDER — CITALOPRAM HYDROBROMIDE 10 MG PO TABS
10.0000 mg | ORAL_TABLET | Freq: Every day | ORAL | Status: DC
  Administered 2012-03-24 – 2012-03-25 (×2): 10 mg via ORAL
  Filled 2012-03-23 (×2): qty 1

## 2012-03-23 MED ORDER — HYDROMORPHONE HCL PF 1 MG/ML IJ SOLN
INTRAMUSCULAR | Status: AC
  Filled 2012-03-23: qty 1

## 2012-03-23 MED ORDER — POVIDONE-IODINE 7.5 % EX SOLN
Freq: Once | CUTANEOUS | Status: DC
  Filled 2012-03-23: qty 118

## 2012-03-23 MED ORDER — METOCLOPRAMIDE HCL 5 MG/ML IJ SOLN
5.0000 mg | Freq: Three times a day (TID) | INTRAMUSCULAR | Status: DC | PRN
  Administered 2012-03-25: 10 mg via INTRAVENOUS
  Filled 2012-03-23 (×2): qty 2

## 2012-03-23 MED ORDER — SODIUM CHLORIDE 0.9 % IR SOLN
Status: DC | PRN
  Administered 2012-03-23: 1000 mL

## 2012-03-23 MED ORDER — ACETAMINOPHEN 10 MG/ML IV SOLN
INTRAVENOUS | Status: AC
  Filled 2012-03-23: qty 100

## 2012-03-23 MED ORDER — CHLORHEXIDINE GLUCONATE 4 % EX LIQD
60.0000 mL | Freq: Once | CUTANEOUS | Status: DC
Start: 2012-03-23 — End: 2012-03-23

## 2012-03-23 MED ORDER — ENOXAPARIN SODIUM 30 MG/0.3ML ~~LOC~~ SOLN
30.0000 mg | Freq: Two times a day (BID) | SUBCUTANEOUS | Status: DC
  Administered 2012-03-24 – 2012-03-25 (×3): 30 mg via SUBCUTANEOUS
  Filled 2012-03-23 (×5): qty 0.3

## 2012-03-23 MED ORDER — MENTHOL 3 MG MT LOZG: 1.0000 | LOZENGE | OROMUCOSAL | Status: DC | PRN

## 2012-03-23 MED ORDER — DEXAMETHASONE SODIUM PHOSPHATE 4 MG/ML IJ SOLN
INTRAMUSCULAR | Status: DC | PRN
  Administered 2012-03-23: 4 mg via INTRAVENOUS

## 2012-03-23 MED ORDER — LIDOCAINE HCL (CARDIAC) 20 MG/ML IV SOLN
INTRAVENOUS | Status: DC | PRN
  Administered 2012-03-23: 60 mg via INTRAVENOUS

## 2012-03-23 MED ORDER — POTASSIUM CHLORIDE IN NACL 20-0.9 MEQ/L-% IV SOLN
INTRAVENOUS | Status: DC
  Administered 2012-03-23 – 2012-03-24 (×2): via INTRAVENOUS
  Filled 2012-03-23 (×7): qty 1000

## 2012-03-23 MED ORDER — SIMVASTATIN 20 MG PO TABS: 20.0000 mg | ORAL_TABLET | Freq: Every day | ORAL | Status: DC

## 2012-03-23 MED ORDER — CEFAZOLIN SODIUM-DEXTROSE 2-3 GM-% IV SOLR
INTRAVENOUS | Status: AC
  Filled 2012-03-23: qty 50

## 2012-03-23 MED ORDER — ADULT MULTIVITAMIN W/MINERALS CH
1.0000 | ORAL_TABLET | Freq: Every day | ORAL | Status: DC
  Administered 2012-03-24: 1 via ORAL
  Filled 2012-03-23 (×3): qty 1

## 2012-03-23 MED ORDER — PROMETHAZINE HCL 25 MG/ML IJ SOLN: 6.2500 mg | Freq: Once | INTRAMUSCULAR | Status: DC

## 2012-03-23 MED ORDER — CEFAZOLIN SODIUM-DEXTROSE 2-3 GM-% IV SOLR
2.0000 g | Freq: Four times a day (QID) | INTRAVENOUS | Status: AC
  Administered 2012-03-23 (×2): 2 g via INTRAVENOUS
  Filled 2012-03-23 (×3): qty 50

## 2012-03-23 MED ORDER — FENTANYL CITRATE 0.05 MG/ML IJ SOLN
INTRAMUSCULAR | Status: DC | PRN
  Administered 2012-03-23 (×2): 25 ug via INTRAVENOUS
  Administered 2012-03-23: 50 ug via INTRAVENOUS
  Administered 2012-03-23: 25 ug via INTRAVENOUS
  Administered 2012-03-23 (×2): 50 ug via INTRAVENOUS
  Administered 2012-03-23: 100 ug via INTRAVENOUS
  Administered 2012-03-23: 50 ug via INTRAVENOUS
  Administered 2012-03-23: 75 ug via INTRAVENOUS

## 2012-03-23 MED ORDER — CEFUROXIME SODIUM 1.5 G IJ SOLR
INTRAMUSCULAR | Status: DC | PRN
  Administered 2012-03-23: 1.5 g

## 2012-03-23 MED ORDER — SUCCINYLCHOLINE CHLORIDE 20 MG/ML IJ SOLN
INTRAMUSCULAR | Status: DC | PRN
  Administered 2012-03-23: 100 mg via INTRAVENOUS

## 2012-03-23 MED ORDER — PROPOFOL 10 MG/ML IV BOLUS
INTRAVENOUS | Status: DC | PRN
  Administered 2012-03-23: 150 mg via INTRAVENOUS

## 2012-03-23 MED ORDER — LISINOPRIL 40 MG PO TABS
40.0000 mg | ORAL_TABLET | Freq: Every day | ORAL | Status: DC
  Administered 2012-03-23 – 2012-03-25 (×2): 40 mg via ORAL
  Filled 2012-03-23 (×3): qty 1

## 2012-03-23 MED ORDER — ATORVASTATIN CALCIUM 10 MG PO TABS
10.0000 mg | ORAL_TABLET | Freq: Every day | ORAL | Status: DC
  Administered 2012-03-23 – 2012-03-24 (×2): 10 mg via ORAL
  Filled 2012-03-23 (×3): qty 1

## 2012-03-23 MED ORDER — MULTI-VITAMIN/MINERALS PO TABS: 1.0000 | ORAL_TABLET | Freq: Every day | ORAL | Status: DC

## 2012-03-23 MED ORDER — ONDANSETRON HCL 4 MG PO TABS
4.0000 mg | ORAL_TABLET | Freq: Four times a day (QID) | ORAL | Status: DC | PRN
  Administered 2012-03-23: 4 mg via ORAL
  Filled 2012-03-23 (×2): qty 1

## 2012-03-23 MED ORDER — SORBITOL 70 % SOLN
30.0000 mL | Freq: Two times a day (BID) | Status: DC
  Administered 2012-03-23 – 2012-03-24 (×3): 30 mL via ORAL
  Filled 2012-03-23 (×5): qty 30

## 2012-03-23 MED ORDER — HYDROMORPHONE HCL PF 1 MG/ML IJ SOLN
0.5000 mg | INTRAMUSCULAR | Status: DC | PRN
Start: 2012-03-23 — End: 2012-03-25
  Administered 2012-03-24 – 2012-03-25 (×2): 1 mg via INTRAVENOUS
  Filled 2012-03-23 (×2): qty 1

## 2012-03-23 MED ORDER — ACETAMINOPHEN 10 MG/ML IV SOLN
INTRAVENOUS | Status: DC | PRN
  Administered 2012-03-23: 1000 mg via INTRAVENOUS

## 2012-03-23 MED ORDER — CELECOXIB 200 MG PO CAPS
200.0000 mg | ORAL_CAPSULE | Freq: Every day | ORAL | Status: DC
  Administered 2012-03-23 – 2012-03-25 (×3): 200 mg via ORAL
  Filled 2012-03-23 (×3): qty 1

## 2012-03-23 MED ORDER — OXYCODONE HCL 5 MG PO TABS
5.0000 mg | ORAL_TABLET | ORAL | Status: DC | PRN
  Administered 2012-03-23 (×2): 10 mg via ORAL
  Administered 2012-03-24: 5 mg via ORAL
  Administered 2012-03-24 – 2012-03-25 (×5): 10 mg via ORAL
  Filled 2012-03-23: qty 1
  Filled 2012-03-23 (×4): qty 2
  Filled 2012-03-23: qty 1
  Filled 2012-03-23 (×3): qty 2

## 2012-03-23 MED ORDER — DOCUSATE SODIUM 100 MG PO CAPS
100.0000 mg | ORAL_CAPSULE | Freq: Two times a day (BID) | ORAL | Status: DC
  Administered 2012-03-23 – 2012-03-25 (×4): 100 mg via ORAL
  Filled 2012-03-23 (×6): qty 1

## 2012-03-23 MED ORDER — LACTATED RINGERS IV SOLN
INTRAVENOUS | Status: DC | PRN
  Administered 2012-03-23 (×2): via INTRAVENOUS

## 2012-03-23 MED ORDER — ONDANSETRON HCL 4 MG/2ML IJ SOLN
INTRAMUSCULAR | Status: DC | PRN
  Administered 2012-03-23: 4 mg via INTRAVENOUS

## 2012-03-23 MED ORDER — ACETAMINOPHEN 325 MG PO TABS
650.0000 mg | ORAL_TABLET | Freq: Four times a day (QID) | ORAL | Status: DC | PRN
  Administered 2012-03-25: 650 mg via ORAL
  Filled 2012-03-23: qty 2

## 2012-03-23 SURGICAL SUPPLY — 72 items
BANDAGE ESMARK 6X9 LF (GAUZE/BANDAGES/DRESSINGS) ×1 IMPLANT
BLADE SAGITTAL 25.0X1.19X90 (BLADE) ×2 IMPLANT
BLADE SAW SGTL 11.0X1.19X90.0M (BLADE) IMPLANT
BLADE SAW SGTL 13.0X1.19X90.0M (BLADE) ×2 IMPLANT
BLADE SURG 10 STRL SS (BLADE) ×4 IMPLANT
BNDG ELASTIC 6X15 VLCR STRL LF (GAUZE/BANDAGES/DRESSINGS) ×2 IMPLANT
BNDG ESMARK 6X9 LF (GAUZE/BANDAGES/DRESSINGS) ×2
BOWL SMART MIX CTS (DISPOSABLE) ×2 IMPLANT
CEMENT HV SMART SET (Cement) ×4 IMPLANT
CLOTH BEACON ORANGE TIMEOUT ST (SAFETY) ×2 IMPLANT
CLSR STERI-STRIP ANTIMIC 1/2X4 (GAUZE/BANDAGES/DRESSINGS) ×2 IMPLANT
COVER BACK TABLE 24X17X13 BIG (DRAPES) IMPLANT
COVER PROBE W GEL 5X96 (DRAPES) IMPLANT
COVER SURGICAL LIGHT HANDLE (MISCELLANEOUS) ×2 IMPLANT
CUFF TOURNIQUET SINGLE 34IN LL (TOURNIQUET CUFF) ×2 IMPLANT
CUFF TOURNIQUET SINGLE 44IN (TOURNIQUET CUFF) IMPLANT
DRAPE EXTREMITY T 121X128X90 (DRAPE) ×2 IMPLANT
DRAPE INCISE IOBAN 66X45 STRL (DRAPES) ×2 IMPLANT
DRAPE PROXIMA HALF (DRAPES) ×2 IMPLANT
DRAPE U-SHAPE 47X51 STRL (DRAPES) ×2 IMPLANT
DRSG ADAPTIC 3X8 NADH LF (GAUZE/BANDAGES/DRESSINGS) IMPLANT
DRSG PAD ABDOMINAL 8X10 ST (GAUZE/BANDAGES/DRESSINGS) IMPLANT
DURAPREP 26ML APPLICATOR (WOUND CARE) ×2 IMPLANT
ELECT CAUTERY BLADE 6.4 (BLADE) ×2 IMPLANT
ELECT REM PT RETURN 9FT ADLT (ELECTROSURGICAL) ×2
ELECTRODE REM PT RTRN 9FT ADLT (ELECTROSURGICAL) ×1 IMPLANT
EVACUATOR 1/8 PVC DRAIN (DRAIN) IMPLANT
FACESHIELD LNG OPTICON STERILE (SAFETY) ×2 IMPLANT
GLOVE BIO SURGEON STRL SZ7 (GLOVE) ×2 IMPLANT
GLOVE BIOGEL PI IND STRL 6.5 (GLOVE) ×1 IMPLANT
GLOVE BIOGEL PI IND STRL 7.0 (GLOVE) ×2 IMPLANT
GLOVE BIOGEL PI IND STRL 7.5 (GLOVE) ×1 IMPLANT
GLOVE BIOGEL PI INDICATOR 6.5 (GLOVE) ×1
GLOVE BIOGEL PI INDICATOR 7.0 (GLOVE) ×2
GLOVE BIOGEL PI INDICATOR 7.5 (GLOVE) ×1
GLOVE SS BIOGEL STRL SZ 6.5 (GLOVE) ×1 IMPLANT
GLOVE SS BIOGEL STRL SZ 7.5 (GLOVE) ×1 IMPLANT
GLOVE SUPERSENSE BIOGEL SZ 6.5 (GLOVE) ×1
GLOVE SUPERSENSE BIOGEL SZ 7.5 (GLOVE) ×1
GLOVE SURG SS PI 7.0 STRL IVOR (GLOVE) ×4 IMPLANT
GOWN PREVENTION PLUS XLARGE (GOWN DISPOSABLE) ×4 IMPLANT
GOWN STRL NON-REIN LRG LVL3 (GOWN DISPOSABLE) ×4 IMPLANT
HANDPIECE INTERPULSE COAX TIP (DISPOSABLE) ×1
HOOD PEEL AWAY FACE SHEILD DIS (HOOD) ×4 IMPLANT
IMMOBILIZER KNEE 22 UNIV (SOFTGOODS) ×2 IMPLANT
INSERT CUSHION PRONEVIEW LG (MISCELLANEOUS) ×2 IMPLANT
KIT BASIN OR (CUSTOM PROCEDURE TRAY) ×2 IMPLANT
KIT ROOM TURNOVER OR (KITS) ×2 IMPLANT
MANIFOLD NEPTUNE II (INSTRUMENTS) ×2 IMPLANT
NS IRRIG 1000ML POUR BTL (IV SOLUTION) ×2 IMPLANT
PACK TOTAL JOINT (CUSTOM PROCEDURE TRAY) ×2 IMPLANT
PAD ARMBOARD 7.5X6 YLW CONV (MISCELLANEOUS) ×4 IMPLANT
PAD CAST 4YDX4 CTTN HI CHSV (CAST SUPPLIES) IMPLANT
PADDING CAST COTTON 4X4 STRL (CAST SUPPLIES)
PADDING CAST COTTON 6X4 STRL (CAST SUPPLIES) IMPLANT
POSITIONER HEAD PRONE TRACH (MISCELLANEOUS) ×2 IMPLANT
RUBBERBAND STERILE (MISCELLANEOUS) ×2 IMPLANT
SET HNDPC FAN SPRY TIP SCT (DISPOSABLE) ×1 IMPLANT
SPONGE GAUZE 4X4 12PLY (GAUZE/BANDAGES/DRESSINGS) IMPLANT
STRIP CLOSURE SKIN 1/2X4 (GAUZE/BANDAGES/DRESSINGS) IMPLANT
SUCTION FRAZIER TIP 10 FR DISP (SUCTIONS) ×2 IMPLANT
SUT MNCRL AB 3-0 PS2 18 (SUTURE) ×2 IMPLANT
SUT VIC AB 0 CT1 27 (SUTURE) ×2
SUT VIC AB 0 CT1 27XBRD ANBCTR (SUTURE) ×2 IMPLANT
SUT VIC AB 2-0 CT1 27 (SUTURE) ×2
SUT VIC AB 2-0 CT1 TAPERPNT 27 (SUTURE) ×2 IMPLANT
SUT VLOC 180 0 24IN GS25 (SUTURE) ×2 IMPLANT
SYR 30ML SLIP (SYRINGE) ×2 IMPLANT
TOWEL OR 17X24 6PK STRL BLUE (TOWEL DISPOSABLE) ×2 IMPLANT
TOWEL OR 17X26 10 PK STRL BLUE (TOWEL DISPOSABLE) ×2 IMPLANT
TRAY FOLEY CATH 14FR (SET/KITS/TRAYS/PACK) ×2 IMPLANT
WATER STERILE IRR 1000ML POUR (IV SOLUTION) ×4 IMPLANT

## 2012-03-23 NOTE — Anesthesia Preprocedure Evaluation (Addendum)
Anesthesia Evaluation  Patient identified by MRN, date of birth, ID band Patient awake    Reviewed: Allergy & Precautions, H&P , NPO status , Patient's Chart, lab work & pertinent test results  History of Anesthesia Complications (+) PONV  Airway Mallampati: III TM Distance: >3 FB     Dental  (+) Teeth Intact and Dental Advisory Given   Pulmonary Current Smoker,  breath sounds clear to auscultation  Pulmonary exam normal       Cardiovascular hypertension, Pt. on medications Rhythm:Regular Rate:Normal     Neuro/Psych PSYCHIATRIC DISORDERS Depression negative neurological ROS     GI/Hepatic negative GI ROS, Neg liver ROS,   Endo/Other  negative endocrine ROS  Renal/GU negative Renal ROS     Musculoskeletal  (+) Arthritis -, Osteoarthritis,    Abdominal Normal abdominal exam  (+)   Peds  Hematology negative hematology ROS (+)   Anesthesia Other Findings   Reproductive/Obstetrics                        Anesthesia Physical Anesthesia Plan  ASA: II  Anesthesia Plan: General and Regional   Post-op Pain Management:    Induction: Intravenous  Airway Management Planned: LMA and Oral ETT  Additional Equipment:   Intra-op Plan:   Post-operative Plan: Extubation in OR  Informed Consent: I have reviewed the patients History and Physical, chart, labs and discussed the procedure including the risks, benefits and alternatives for the proposed anesthesia with the patient or authorized representative who has indicated his/her understanding and acceptance.   Dental advisory given  Plan Discussed with: Anesthesiologist and Surgeon  Anesthesia Plan Comments:        Anesthesia Quick Evaluation

## 2012-03-23 NOTE — Preoperative (Signed)
Beta Blockers   Reason not to administer Beta Blockers:Not Applicable 

## 2012-03-23 NOTE — Progress Notes (Signed)
UR COMPLETED  

## 2012-03-23 NOTE — Anesthesia Postprocedure Evaluation (Signed)
  Anesthesia Post-op Note  Patient: Kimberly Edwards  Procedure(s) Performed: Procedure(s) (LRB): TOTAL KNEE ARTHROPLASTY (Right)  Patient Location: PACU  Anesthesia Type: GA combined with regional for post-op pain  Level of Consciousness: awake and sedated  Airway and Oxygen Therapy: Patient Spontanous Breathing and Patient connected to nasal cannula oxygen  Post-op Pain: mild  Post-op Assessment: Post-op Vital signs reviewed, Patient's Cardiovascular Status Stable, Respiratory Function Stable, Patent Airway and No signs of Nausea or vomiting  Post-op Vital Signs: Reviewed and stable  Complications: No apparent anesthesia complications

## 2012-03-23 NOTE — Op Note (Signed)
MRN:     [id number] DOB/AGE:    1941/09/07 / 71 y.o.       OPERATIVE REPORT    DATE OF PROCEDURE:  03/23/2012       PREOPERATIVE DIAGNOSIS:   DJD RIGHT KNEE      Estimated Body mass index is 35.91 kg/(m^2) as calculated from the following:   Height as of 12/09/11: 4' 10.8"(1.494 m).   Weight as of 12/09/11: 176 lb 9.6 oz(80.105 kg).                                                        POSTOPERATIVE DIAGNOSIS:   DJD RIGHT KNEE                                                                      PROCEDURE:  Procedure(s): TOTAL KNEE ARTHROPLASTY Using Depuy Sigma RP implants #2.5 Femur, #3 revisionTibia, 15mm sigma RP bearing, 32 Patella     SURGEON: Ryah Cribb A    ASSISTANT:  Kirstin Shepperson PA-C   (Present and scrubbed throughout the case, critical for assistance with exposure, retraction, instrumentation, and closure.)         ANESTHESIA: GET with Femoral Nerve Block  DRAINS: foley, 2 medium hemovac in knee   TOURNIQUET TIME:   COMPLICATIONS:  None     SPECIMENS: None   INDICATIONS FOR PROCEDURE: The patient has  DJD RIGHT KNEE, valgus deformities, XR shows bone on bone arthritis. Patient has failed all conservative measures including anti-inflammatory medicines, narcotics, attempts at  exercise and weight loss, cortisone injections and viscosupplementation.  Risks and benefits of surgery have been discussed, questions answered.   DESCRIPTION OF PROCEDURE: The patient identified by armband, received  right femoral nerve block and IV antibiotics, in the holding area at [facility]. Patient taken to the operating room, appropriate anesthetic  monitors were attached General endotracheal anesthesia induced with  the patient in supine position, Foley catheter was inserted. Tourniquet  applied high to the operative thigh. Lateral post and foot positioner  applied to the table, the lower extremity was then prepped and draped  in usual sterile fashion from the ankle  to the tourniquet. Time-out procedure was performed. The limb was wrapped with an Esmarch bandage and the tourniquet inflated to 365 mmHg. We began the operation by making the anterior midline incision starting at handbreadth above the patella going over the patella 1 cm medial to and  4 cm distal to the tibial tubercle. Small bleeders in the skin and the  subcutaneous tissue identified and cauterized. Transverse retinaculum was incised and reflected medially and a medial parapatellar arthrotomy was accomplished. the patella was everted and theprepatellar fat pad resected. The superficial medial collateral  ligament was then elevated from anterior to posterior along the proximal  flare of the tibia and anterior half of the menisci resected. The knee was hyperflexed exposing bone on bone arthritis. Peripheral and notch osteophytes as well as the cruciate ligaments were then resected. We continued to  work our way around posteriorly along the proximal tibia, and externally  rotated the tibia subluxing it out from underneath the femur. A McHale  retractor was placed through the notch and a lateral Hohmann retractor  placed, and we then drilled through the proximal tibia in line with the  axis of the tibia followed by an intramedullary guide rod and 2-degree  posterior slope cutting guide. The tibial cutting guide was pinned into place  allowing resection of 8 mm of bone medially and about 1 mm of bone  laterally because of her varus deformity. Satisfied with the tibial resection, we then  entered the distal femur 2 mm anterior to the PCL origin with the  intramedullary guide rod and applied the distal femoral cutting guide  set at 11mm, with 5 degrees of valgus. This was pinned along the  epicondylar axis. At this point, the distal femoral cut was accomplished without difficulty. We then sized for a #2.5 femoral component and pinned the guide in 3 degrees of external rotation.The chamfer cutting guide  was pinned into place. The anterior, posterior, and chamfer cuts were accomplished without difficulty followed by  the Sigma RP box cutting guide and the box cut. We also removed posterior osteophytes from the posterior femoral condyles. At this  time, the knee was brought into full extension. We checked our  extension and flexion gaps and found them symmetric at 15mm.  The patella thickness measured at 24 mm. We set the cutting guide at 15 and removed the posterior 9.5-10 mm  of the patella sized for 32 button and drilled the lollipop. The knee  was then once again hyperflexed exposing the proximal tibia. We sized for a #3 revision tibial base plate, applied the smokestack and the conical reamer followed by the the Delta fin keel punch. We then hammered into place the Sigma RP trial femoral component, inserted a 15-mm trial bearing, trial patellar button, and took the knee through range of motion from 0-130 degrees. No thumb pressure was required for patellar  tracking. At this point, all trial components were removed, a double batch of DePuy HV cement with 1500 mg of Zinacef was mixed and applied to all bony metallic mating surfaces except for the posterior condyles of the femur itself. In order, we  hammered into place the tibial tray and removed excess cement, the femoral component and removed excess cement, a 15mm Sigma RP bearing  was inserted, and the knee brought to full extension with compression.  The patellar button was clamped into place, and excess cement  removed. While the cement cured the wound was irrigated out with normal saline solution pulse lavage, and medium Hemovac drains were placed.. Ligament stability and patellar tracking were checked and found to be excellent. The tourniquet was then released and hemostasis was obtained with cautery. The parapatellar arthrotomy was closed with  #1 Z lock suture. The subcutaneous tissue with 0 and 2-0 undyed  Vicryl suture, and 4-0 Monocryl.. A  dressing of Xeroform,  4 x 4, dressing sponges, Webril, and Ace wrap applied. Needle and sponge count were correct times 2.The patient awakened, extubated, and taken to recovery room without difficulty. Vascular status was normal, pulses 2+ and symmetric.   Rhianon Zabawa A 03/23/2012, 10:55 AM

## 2012-03-23 NOTE — Interval H&P Note (Signed)
History and Physical Interval Note:  03/23/2012 8:58 AM  Kimberly Edwards  has presented today for surgery, with the diagnosis of DJD RIGHT KNEE  The various methods of treatment have been discussed with the patient and family. After consideration of risks, benefits and other options for treatment, the patient has consented to  Procedure(s) (LRB): TOTAL KNEE ARTHROPLASTY (Right) as a surgical intervention .  The patients' history has been reviewed, patient examined, no change in status, stable for surgery.  I have reviewed the patients' chart and labs.  Questions were answered to the patient's satisfaction.     Salvatore Marvel A

## 2012-03-23 NOTE — Anesthesia Procedure Notes (Addendum)
Anesthesia Regional Block:  Femoral nerve block  Pre-Anesthetic Checklist: ,, timeout performed, Correct Patient, Correct Site, Correct Laterality, Correct Procedure, Correct Position, site marked, Risks and benefits discussed, pre-op evaluation,  At surgeon's request and post-op pain management  Laterality: Right  Prep: Maximum Sterile Barrier Precautions used and chloraprep       Needles:  Injection technique: Single-shot  Needle Type: Echogenic Stimulator Needle      Needle Gauge: 22 and 22 G    Additional Needles:  Procedures: ultrasound guided and nerve stimulator Femoral nerve block  Nerve Stimulator or Paresthesia:  Response: Patellar respose, 0.4 mA,   Additional Responses:   Narrative:  Start time: 03/23/2012 8:25 AM End time: 03/23/2012 8:35 AM Injection made incrementally with aspirations every 5 mL. Anesthesiologist: Fitzgerald,MD  Additional Notes: 2% Lidocaine skin wheel.   Femoral nerve block Procedure Name: LMA Insertion Date/Time: 03/23/2012 9:14 AM Performed by: Margaree Mackintosh Pre-anesthesia Checklist: Patient identified, Timeout performed, Emergency Drugs available, Suction available and Patient being monitored Patient Re-evaluated:Patient Re-evaluated prior to inductionOxygen Delivery Method: Circle system utilized Preoxygenation: Pre-oxygenation with 100% oxygen Intubation Type: IV induction LMA: LMA with gastric port inserted LMA Size: 4.0 Number of attempts: 1 Placement Confirmation: positive ETCO2 and breath sounds checked- equal and bilateral Tube secured with: Tape Dental Injury: Teeth and Oropharynx as per pre-operative assessment     Procedure Name: Intubation Date/Time: 03/23/2012 9:50 AM Performed by: Margaree Mackintosh Pre-anesthesia Checklist: Patient identified, Timeout performed, Emergency Drugs available, Suction available and Patient being monitored Patient Re-evaluated:Patient Re-evaluated prior to inductionOxygen Delivery  Method: Circle system utilized Preoxygenation: Pre-oxygenation with 100% oxygen Laryngoscope size: Glidescope utilized. Grade View: Grade I Tube type: Oral Tube size: 7.0 mm Number of attempts: 1 Airway Equipment and Method: Stylet and Video-laryngoscopy Placement Confirmation: ETT inserted through vocal cords under direct vision,  positive ETCO2 and breath sounds checked- equal and bilateral Secured at: 21 cm Tube secured with: Tape Dental Injury: Teeth and Oropharynx as per pre-operative assessment

## 2012-03-23 NOTE — H&P (View-Only) (Signed)
Kimberly Edwards is an 71 y.o. female.   Chief Complaint: right knee DJD HPI: Kimberly Edwards is seen for increasing right knee pain where she has known degenerative joint disease. She was scheduled for a total knee replacement in July but her pain has gotten to the point that she can barely walk. We saw her 2 weeks ago and discussed the need for total knee replacement because she has no remaining joint space in her right knee on x-rays and she now needs an assistive device to walk. She has an appointment with her primary care physician Dr. Laury Axon coming up. Her pain is severe and her gait is severely compromised due to this.   Past Medical History  Diagnosis Date  . Hypertension   . Hyperlipidemia   . Arthritis   . Right knee DJD   . Knee joint replacement status, left 2003    Past Surgical History  Procedure Date  . Cholecystectomy 1998  . Replacement total knee     left knee  . Breast biopsy 2000    marker put in    Family History  Problem Relation Age of Onset  . Breast cancer Sister   . Hypertension Father    Social History:  reports that she has been smoking Cigarettes.  She has a 12.5 pack-year smoking history. She does not have any smokeless tobacco history on file. She reports that she drinks alcohol. She reports that she does not use illicit drugs.  Allergies: No Known Allergies  Current Outpatient Prescriptions on File Prior to Visit  Medication Sig Dispense Refill  . aspirin 81 MG EC tablet Take 81 mg by mouth daily.        . celecoxib (CELEBREX) 200 MG capsule Take 1 capsule (200 mg total) by mouth daily.  30 capsule  11  . citalopram (CELEXA) 10 MG tablet Take 1 tablet (10 mg total) by mouth daily.  30 tablet  11  . diltiazem (TAZTIA XT) 300 MG 24 hr capsule Take 1 capsule (300 mg total) by mouth daily.  30 capsule  5  . Glucosamine HCl 1500 MG TABS Take 2 tablets by mouth daily.       [name] Kitchen lisinopril (PRINIVIL,ZESTRIL) 40 MG tablet Take 1 tablet (40 mg total) by mouth daily.   30 tablet  5  . pravastatin (PRAVACHOL) 40 MG tablet Take 1 tablet (40 mg total) by mouth daily.  30 tablet  2  . triamterene-hydrochlorothiazide (DYAZIDE) 37.5-25 MG per capsule Take 1 each (1 capsule total) by mouth every morning.  30 capsule  5  . amoxicillin (AMOXIL) 500 MG capsule As directed prior to dental cleaning  30 capsule  0  . zoster vaccine live, PF, (ZOSTAVAX) 04540 UNT/0.65ML injection Inject 19,400 Units into the skin once.  1 vial  0    No results found for this or any previous visit (from the past 48 hour(s)). No results found.  Review of Systems  Constitutional: Negative.   HENT: Negative.   Eyes: Negative.   Respiratory: Negative.   Cardiovascular: Negative.   Gastrointestinal: Negative.   Genitourinary: Negative.   Musculoskeletal: Positive for joint pain.       Right knee  Skin: Negative.   Neurological: Negative.   Endo/Heme/Allergies: Negative.   Psychiatric/Behavioral: Negative.     Blood pressure 120/76, pulse 65, temperature 98.4 F (36.9 C), height 4\' 10"  (1.473 m), weight 81.194 kg (179 lb), SpO2 94.00%. Physical Exam  Constitutional: She is oriented to person, place, and time. She  appears well-developed and well-nourished.  HENT:  Head: Normocephalic and atraumatic.  Mouth/Throat: Oropharynx is clear and moist.  Eyes: EOM are normal. Pupils are equal, round, and reactive to light.  Neck: Normal range of motion. Neck supple.  Cardiovascular: Normal rate and regular rhythm.   Respiratory: Effort normal.  GI: Soft.  Musculoskeletal:       Height 4'10" weight 175 pounds. Respirations are 12 and unlabored. Examination of her right knee reveals valgus deformity with 1 to 2+ effusion, range of motion -5 to 115 degrees with significant pain no instability with normal patella tracking. Left knee reveals well healed total knee incision without swelling or pain, range of motion 0-115 degrees knee is stable with normal patella tracking.   Neurological: She  is alert and oriented to person, place, and time.  Skin: Skin is warm and dry.  Psychiatric: She has a normal mood and affect. Her behavior is normal. Judgment and thought content normal.    X-RAYS: X-rays of her right knee standing AP lateral flexion and sunrise views show significant joint space narrowing with no joint space remaining in the lateral compartment with significant lateral erosion and medial compartment loss of all joint space as well with significant valgus deformity.  Assessment Patient Active Problem List  Diagnoses  . Osteoarthritis  . HTN (hypertension)  . Hyperlipidemia  . Hypertension  . Arthritis  . Knee joint replacement status, left  . Right knee DJD    Plan I talk to her about this in detail. She has failed conservative care. She needs assistive devices to walk and we give her a prescription for a walker and a cane. Place her in a J&J brace. She has preoperative clearance from Dr. Laury Axon. Discussed risks benefits and possible complications of the surgery in detail and she understands this completely.   Kimberly Edwards J 03/10/2012, 3:31 PM

## 2012-03-23 NOTE — Progress Notes (Signed)
Orthopedic Tech Progress Note Patient Details:  Kimberly Edwards [birth year] [id number]  CPM Right Knee CPM Right Knee: On Right Knee Flexion (Degrees): 90  Right Knee Extension (Degrees): 0  Additional Comments: trapeze bar   Cammer, Mickie Bail 03/23/2012, 11:46 AM

## 2012-03-23 NOTE — Transfer of Care (Signed)
Immediate Anesthesia Transfer of Care Note  Patient: Kimberly Edwards  Procedure(s) Performed: Procedure(s) (LRB): TOTAL KNEE ARTHROPLASTY (Right)  Patient Location: PACU  Anesthesia Type: GA combined with regional for post-op pain  Level of Consciousness: awake, alert  and oriented  Airway & Oxygen Therapy: Patient Spontanous Breathing and Patient connected to nasal cannula oxygen  Post-op Assessment: Report given to PACU RN, Post -op Vital signs reviewed and stable and Patient moving all extremities  Post vital signs: Reviewed and stable  Complications: No apparent anesthesia complications

## 2012-03-23 NOTE — Plan of Care (Signed)
Problem: Consults Goal: Diagnosis- Total Joint Replacement Primary Total Knee Right     

## 2012-03-23 NOTE — Brief Op Note (Signed)
03/23/2012  10:37 AM  PATIENT:  Kimberly Edwards  71 y.o. female  PRE-OPERATIVE DIAGNOSIS:  DJD RIGHT KNEE  POST-OPERATIVE DIAGNOSIS:  DJD RIGHT KNEE  PROCEDURE:  Procedure(s) (LRB): TOTAL KNEE ARTHROPLASTY (Right)  SURGEON:  Surgeon(s) and Role:    * Nilda Simmer, MD - Primary  PHYSICIAN ASSISTANT: Kirstin Shepperson PA-C  ASSISTANTS: Kirstin Shepperson PA-C   ANESTHESIA:   general  EBL:  Total I/O In: -  Out: 50 [Blood:50]  BLOOD ADMINISTERED:none  DRAINS: (2) Hemovact drain(s) in the right knee with  Suction Clamped   LOCAL MEDICATIONS USED:  NONE  SPECIMEN:  No Specimen  DISPOSITION OF SPECIMEN:  N/A  COUNTS:  YES  TOURNIQUET:  * Missing tourniquet times found for documented tourniquets in log:  [id number] *  DICTATION: .Note written in EPIC  PLAN OF CARE: Admit to inpatient   PATIENT DISPOSITION:  PACU - hemodynamically stable.   Delay start of Pharmacological VTE agent (>24hrs) due to surgical blood loss or risk of bleeding: no

## 2012-03-24 ENCOUNTER — Encounter (HOSPITAL_COMMUNITY): Payer: Self-pay | Admitting: Orthopedic Surgery

## 2012-03-24 LAB — CBC
HCT: 32.4 % — ABNORMAL LOW (ref 36.0–46.0)
Hemoglobin: 10.7 g/dL — ABNORMAL LOW (ref 12.0–15.0)
MCHC: 33 g/dL (ref 30.0–36.0)
RBC: 3.71 MIL/uL — ABNORMAL LOW (ref 3.87–5.11)
WBC: 9.8 10*3/uL (ref 4.0–10.5)

## 2012-03-24 LAB — BASIC METABOLIC PANEL
BUN: 18 mg/dL (ref 6–23)
CO2: 24 mEq/L (ref 19–32)
Chloride: 106 mEq/L (ref 96–112)
Glucose, Bld: 146 mg/dL — ABNORMAL HIGH (ref 70–99)
Potassium: 4 mEq/L (ref 3.5–5.1)
Sodium: 138 mEq/L (ref 135–145)

## 2012-03-24 MED ORDER — TRIAMTERENE-HCTZ 37.5-25 MG PO CAPS
1.0000 | ORAL_CAPSULE | Freq: Every day | ORAL | Status: DC
  Administered 2012-03-24 – 2012-03-25 (×2): 1 via ORAL
  Filled 2012-03-24 (×2): qty 1

## 2012-03-24 MED ORDER — SODIUM CHLORIDE 0.9 % IV BOLUS (SEPSIS)
500.0000 mL | Freq: Once | INTRAVENOUS | Status: AC
  Administered 2012-03-24: 500 mL via INTRAVENOUS

## 2012-03-24 NOTE — Clinical Social Work Psychosocial (Signed)
     Clinical Social Work Department BRIEF PSYCHOSOCIAL ASSESSMENT 03/24/2012  Patient:  Kimberly Edwards, Kimberly Edwards     Account Number:  [id number]     Admit date:  03/23/2012  Clinical Social Worker:  Burnard Hawthorne  Date/Time:  03/24/2012 04:02 PM  Referred by:  Physician  Date Referred:  03/24/2012 Referred for  SNF Placement   Other Referral:   Interview type:  Other - See comment Other interview type:   Met with patient and her daughter Rosanne.  cell [phone]    PSYCHOSOCIAL DATA Living Status:  ALONE Admitted from facility:   Level of care:   Primary support name:  Rosean Marrara   [phone] Primary support relationship to patient:  CHILD, ADULT Degree of support available:   Very supportive and involved.    CURRENT CONCERNS Current Concerns  Post-Acute Placement   Other Concerns:    SOCIAL WORK ASSESSMENT / PLAN Patient referred to CSW for short term SNF placement. Patient states that she has pre-toured and signed with [facility] for her post [facility] care.  Spoke with Clerance Lav- states that patient's bed wil be available when medically stable.  Per MD- anticipate d/c tomorrow  to SNF. Willl forward SNF referral to [facility].   Assessment/plan status:  Psychosocial Support/Ongoing Assessment of Needs Other assessment/ plan:   Information/referral to community resources:   SNF list offered but declined as she has already chosen her facility.  Patient denied any wish for other resources.    PATIENTS/FAMILYS RESPONSE TO PLAN OF CARE: Patient and daughter were very pleased with d/c plan and CSW's visit.  Patien is extremely pleasant and has a very positive attitude regarding her need for SNF placement.

## 2012-03-24 NOTE — Progress Notes (Signed)
CARE MANAGEMENT NOTE 03/24/2012  Patient:  Kimberly Edwards, Kimberly Edwards   Account Number:  [id number]  Date Initiated:  03/24/2012  Documentation initiated by:  Vance Peper  Subjective/Objective Assessment:   71 yr old female s/p right total knee arthroplasty.Preoperatively setup with [facility] . Will go to [facility] for shortterm rehab at discharge.     Action/Plan:   Preoperatively setup with [facility]. Will go [facility].   Anticipated DC Date:  03/25/2012   Anticipated DC Plan:  SKILLED NURSING FACILITY      DC Planning Services  CM consult      PAC Choice  NA   Choice offered to / List presented to:             Status of service:  Completed, signed off   Discharge Disposition:  SKILLED NURSING FACILITY

## 2012-03-24 NOTE — Progress Notes (Signed)
Patient ID: Kimberly Edwards, female   DOB: 14-Apr-1941, 71 y.o.   MRN: [id number] PATIENT ID: Kimberly Edwards  MRN: [id number]  DOB/AGE:  1941/06/01 / 71 y.o.  1 Day Post-Op Procedure(s) (LRB): TOTAL KNEE ARTHROPLASTY (Right)    PROGRESS NOTE Subjective: Patient is alert, oriented, no Nausea, no Vomiting, yes passing gas, no Bowel Movement. Taking PO well Denies SOB, Chest or Calf Pain. Using Incentive Spirometer, PAS in place. Ambulate not yet, CPM 0-60 Patient reports pain as 4 on 0-10 scale  .    Objective: Vital signs in last 24 hours: Filed Vitals:   03/23/12 1700 03/23/12 2136 03/24/12 0120 03/24/12 0532  BP: 111/66 121/67 113/66 107/64  Pulse: 56 61 57 58  Temp: 98.2 F (36.8 C) 98.7 F (37.1 C) 98.9 F (37.2 C) 99 F (37.2 C)  TempSrc:      Resp: 16 20 20 18   SpO2: 96% 94% 97% 97%      Intake/Output from previous day: I/O last 3 completed shifts: In: 4831.7 [P.O.:840; I.V.:2526.7; UXLKG:4010; IV Piggyback:100] Out: 50 [Blood:50]   Intake/Output this shift:     LABORATORY DATA:  Basename 03/24/12 0500  WBC 9.8  HGB 10.7*  HCT 32.4*  PLT 188  NA 138  K 4.0  CL 106  CO2 24  BUN 18  CREATININE 0.61  GLUCOSE 146*  GLUCAP --  INR --  CALCIUM 8.1*    Examination: ABD soft Neurovascular intact Sensation intact distally Intact pulses distally Dorsiflexion/Plantar flexion intact Incision: dressing C/D/I}  Assessment:   1 Day Post-Op Procedure(s) (LRB): TOTAL KNEE ARTHROPLASTY (Right) ADDITIONAL DIAGNOSIS:  Acute Blood Loss Anemia Patient Active Problem List  Diagnoses  . Osteoarthritis  . HTN (hypertension)  . Hyperlipidemia  . Hypertension  . Arthritis  . Knee joint replacement status, left  . Right knee DJD   Plan: PT/OT WBAT, CPM 5/hrs day until ROM 0-90 degrees, then D/C CPM DVT Prophylaxis:  SCDx72hrs, ASA 325 mg BID x 2 weeks DISCHARGE PLAN: Skilled Nursing Facility/Rehab DISCHARGE NEEDS: FL2 Fluid bolus times 2, saline lock, dressing  change    Dquan Cortopassi J 03/24/2012, 8:29 AM

## 2012-03-24 NOTE — Evaluation (Signed)
Physical Therapy Evaluation Patient Details Name: Kimberly Edwards MRN: [id number] DOB: 04/21/1941 Today's Date: 03/24/2012 Time: 4098-1191 PT Time Calculation (min): 35 min  PT Assessment / Plan / Recommendation Clinical Impression  pt is s/p R TKA.  Mobility is progressing well with some pain and gait instability.  Expect steady improvement and pt will be going to SNF for rehab.    PT Assessment  Patient needs continued PT services    Follow Up Recommendations  Skilled nursing facility    Barriers to Discharge Decreased caregiver support      lEquipment Recommendations  Defer to next venue    Recommendations for Other Services     Frequency 7X/week    Precautions / Restrictions Precautions Precautions: Knee;Fall Required Braces or Orthoses: Knee Immobilizer - Right Knee Immobilizer - Right: Discontinue post op day 2 Restrictions Weight Bearing Restrictions: Yes RLE Weight Bearing: Weight bearing as tolerated   Pertinent Vitals/Pain 7/10 R leg pain       Mobility  Bed Mobility Bed Mobility: Supine to Sit;Sitting - Scoot to Edge of Bed Supine to Sit: 4: Min assist;HOB flat Sitting - Scoot to [place] of Bed: 4: Min guard Details for Bed Mobility Assistance: vc's for hand placement; LE assist and use of rail Transfers Transfers: Sit to Stand;Stand to Sit Sit to Stand: 4: Min assist Stand to Sit: 4: Min guard Details for Transfer Assistance: vc's for hand placement; mild lifting asssit Ambulation/Gait Ambulation/Gait Assistance: 4: Min guard Ambulation Distance (Feet): 45 Feet Assistive device: Rolling walker Ambulation/Gait Assistance Details: guarded and stiff but functional ; vc's for sequencing and improved use of the RW Gait Pattern: Step-to pattern Stairs: No Wheelchair Mobility Wheelchair Mobility: No    Exercises Total Joint Exercises Ankle Circles/Pumps: AROM;20 reps;Both;Supine Quad Sets: AROM;Both;10 reps;Supine Heel Slides: AAROM;10  reps;Right;Supine Straight Leg Raises: AROM;Right;10 reps;Supine Goniometric ROM: `65   PT Diagnosis: Generalized weakness;Acute pain  PT Problem List: Decreased strength;Decreased range of motion;Decreased activity tolerance;Decreased balance;Decreased knowledge of use of DME;Pain PT Treatment Interventions: DME instruction;Gait training;Functional mobility training;Therapeutic activities;Therapeutic exercise;Balance training;Patient/family education   PT Goals Acute Rehab PT Goals PT Goal Formulation: With patient Time For Goal Achievement: 03/31/12 Potential to Achieve Goals: Good Pt will go Supine/Side to Sit: with supervision PT Goal: Supine/Side to Sit - Progress: Goal set today Pt will go Sit to Stand: with supervision PT Goal: Sit to Stand - Progress: Goal set today Pt will Transfer Bed to Chair/Chair to Bed: with supervision PT Transfer Goal: Bed to Chair/Chair to Bed - Progress: Goal set today Pt will Ambulate: 51 - 150 feet;with supervision;with least restrictive assistive device PT Goal: Ambulate - Progress: Goal set today Pt will Perform Home Exercise Program: with supervision, verbal cues required/provided PT Goal: Perform Home Exercise Program - Progress: Goal set today  Visit Information  Last PT Received On: 03/24/12 Assistance Needed: +1    Subjective Data  Subjective: I'm going to rehab after I get through here Patient Stated Goal: Home I   Prior Functioning  Home Living Lives With: Alone Type of Home: House Home Access: Level entry Home Layout: One level Bathroom Shower/Tub: Health visitor: Standard Home Adaptive Equipment: Environmental consultant - rolling;Straight cane Prior Function Level of Independence: Independent Able to Take Stairs?: Yes Driving: Yes Vocation: Retired Musician: No difficulties    Cognition  Overall Cognitive Status: Appears within functional limits for tasks assessed/performed Arousal/Alertness:  Awake/alert Orientation Level: Oriented X4 / Intact Behavior During Session: [facility] for tasks performed  Extremity/Trunk Assessment Right Lower Extremity Assessment RLE ROM/Strength/Tone: Deficits RLE ROM/Strength/Tone Deficits: grossly >3+/5, 20 degree lag with SLR Left Lower Extremity Assessment LLE ROM/Strength/Tone: Within functional levels Trunk Assessment Trunk Assessment: Normal   Balance Balance Balance Assessed: No  End of Session PT - End of Session Activity Tolerance: Patient tolerated treatment well Patient left: in chair;with call bell/phone within reach Nurse Communication: Mobility status CPM Right Knee CPM Right Knee: On Right Knee Flexion (Degrees): 60  Right Knee Extension (Degrees): 0    Peyton Spengler, Eliseo Gum 03/24/2012, 11:19 AM  03/24/2012  Doon Bing, PT [phone] (pager)

## 2012-03-24 NOTE — Progress Notes (Signed)
Pt was working with PT this morning, walking in the hallway, when she fell to the floor. PT assisted pt back to chair. VS obtained at 1000; HR 61, R 16, O2 96% RA, BP 125/64. Pt was c/o no pain. She was embarrassed about the incident. Called Genelle Bal, PA to notify. No new orders received. Will monitor pt. Tammy Sours

## 2012-03-24 NOTE — Progress Notes (Signed)
Physical Therapy Treatment Patient Details Name: Kimberly Edwards MRN: [id number] DOB: 05-04-1941 Today's Date: 03/24/2012 Time: 5621-3086 PT Time Calculation (min): 22 min  PT Assessment / Plan / Recommendation Comments on Treatment Session  Patient s/p R TKA. Progressing well with ambulation this afternoon. Patient is very motivated with therapy. Patient will benefit from SNF to maximize endurance and mobililty prior to DC home where she lives alone.     Follow Up Recommendations  Skilled nursing facility    Barriers to Discharge        Equipment Recommendations  Defer to next venue    Recommendations for Other Services    Frequency 7X/week   Plan Discharge plan remains appropriate;Frequency remains appropriate    Precautions / Restrictions Precautions Precautions: Fall;Knee Required Braces or Orthoses: Knee Immobilizer - Right Knee Immobilizer - Right: Discontinue post op day 2 Restrictions RLE Weight Bearing: Weight bearing as tolerated   Pertinent Vitals/Pain 5/10 Right knee pain    Mobility  Bed Mobility Supine to Sit: 5: Supervision Details for Bed Mobility Assistance: cues for safest technique Transfers Sit to Stand: 4: Min assist;From bed Stand to Sit: 4: Min assist;To chair/3-in-1;With upper extremity assist Details for Transfer Assistance: A to initiate stand and ensure balacne. Cues for safe technique and not to pull up on RW. Cues to control descent Ambulation/Gait Ambulation/Gait Assistance: 4: Min guard Ambulation Distance (Feet): 120 Feet Assistive device: Rolling walker Ambulation/Gait Assistance Details: Cues for gait sequence and safety with RW Gait Pattern: Step-to pattern    Exercises Total Joint Exercises Quad Sets: AROM;Right;10 reps Heel Slides: AAROM;Right;10 reps Straight Leg Raises: AAROM;Right;10 reps   PT Diagnosis:    PT Problem List:   PT Treatment Interventions:     PT Goals Acute Rehab PT Goals PT Goal: Supine/Side to Sit -  Progress: Met PT Goal: Sit to Stand - Progress: Progressing toward goal PT Transfer Goal: Bed to Chair/Chair to Bed - Progress: Progressing toward goal PT Goal: Ambulate - Progress: Progressing toward goal PT Goal: Perform Home Exercise Program - Progress: Progressing toward goal  Visit Information  Last PT Received On: 03/24/12 Assistance Needed: +1    Subjective Data  Subjective: does everybody know that i fell   Cognition  Overall Cognitive Status: Appears within functional limits for tasks assessed/performed Arousal/Alertness: Awake/alert Orientation Level: Appears intact for tasks assessed Behavior During Session: [facility] for tasks performed    Balance     End of Session PT - End of Session Equipment Utilized During Treatment: Gait belt Activity Tolerance: Patient tolerated treatment well Patient left: in chair;with call bell/phone within reach Nurse Communication: Mobility status     Fredrich Birks 03/24/2012, 3:21 PM 03/24/2012 Fredrich Birks PTA [phone] pager [phone] office

## 2012-03-24 NOTE — Progress Notes (Signed)
Order received, chart reviewed, noted from PT note recommendation is to go to SNF, will defer OT eval to that facility unless D/C plan changes then will eval patient.

## 2012-03-24 NOTE — Clinical Social Work Placement (Addendum)
    Clinical Social Work Department CLINICAL SOCIAL WORK PLACEMENT NOTE 03/24/2012  Patient:  Kimberly Edwards, Kimberly Edwards  Account Number:  [id number] Admit date:  03/23/2012  Clinical Social Worker:  Lupita Leash Sharnita Bogucki, BSW  Date/time:  03/24/2012 04:08 PM  Clinical Social Work is seeking post-discharge placement for this patient at the following level of care:   SKILLED NURSING   (*CSW will update this form in Epic as items are completed)     Patient/family provided with [facility] Department of Clinical Social Work's list of facilities offering this level of care within the geographic area requested by the patient (or if unable, by the patient's family).    Patient/family informed of their freedom to choose among providers that offer the needed level of care, that participate in Medicare, Medicaid or managed care program needed by the patient, have an available bed and are willing to accept the patient.    Patient/family informed of [facility]' ownership interest in [facility], as well as of the fact that they are under no obligation to receive care at this facility.  PASARR submitted to EDS on 03/24/2012 PASARR number received from EDS on 611/2013  FL2 transmitted to all facilities in geographic area requested by pt/family on  03/24/2012 FL2 transmitted to all facilities within larger geographic area on NA  Patient informed that his/her managed care company has contracts with or will negotiate with  certain facilities, including the following:   Patient has [employer].   SNF list deferred by patient as she has pre-sigend with [facility].     Patient/family informed of bed offers received:  03/24/2012 Patient chooses bed at [facility] Physician recommends and patient chooses bed at  [facility]  Patient to be transferred to [facility] on  03/25/2012 Patient to be transferred to facility by [facility]  The following physician request were entered in  Epic:   Additional Comments: Patient and daughter are aware and agreeable to d/c plan. Notified SNF and pt's nurse Nettie Elm. No further CSW intervention indicated.   [name]. [place]  [phone]

## 2012-03-25 DIAGNOSIS — N390 Urinary tract infection, site not specified: Secondary | ICD-10-CM | POA: Diagnosis present

## 2012-03-25 DIAGNOSIS — R112 Nausea with vomiting, unspecified: Secondary | ICD-10-CM

## 2012-03-25 LAB — CBC
HCT: 34 % — ABNORMAL LOW (ref 36.0–46.0)
Hemoglobin: 11.1 g/dL — ABNORMAL LOW (ref 12.0–15.0)
MCH: 28.8 pg (ref 26.0–34.0)
MCHC: 32.6 g/dL (ref 30.0–36.0)
MCV: 88.1 fL (ref 78.0–100.0)
RBC: 3.86 MIL/uL — ABNORMAL LOW (ref 3.87–5.11)

## 2012-03-25 LAB — BASIC METABOLIC PANEL
BUN: 23 mg/dL (ref 6–23)
CO2: 24 mEq/L (ref 19–32)
Calcium: 8.2 mg/dL — ABNORMAL LOW (ref 8.4–10.5)
GFR calc non Af Amer: 59 mL/min — ABNORMAL LOW (ref >90)
Glucose, Bld: 149 mg/dL — ABNORMAL HIGH (ref 70–99)
Sodium: 137 mEq/L (ref 135–145)

## 2012-03-25 MED ORDER — CIPROFLOXACIN HCL 500 MG PO TABS
500.0000 mg | ORAL_TABLET | Freq: Two times a day (BID) | ORAL | Status: DC
  Filled 2012-03-25 (×3): qty 1

## 2012-03-25 MED ORDER — ENOXAPARIN SODIUM 30 MG/0.3ML ~~LOC~~ SOLN: 30.0000 mg | Freq: Two times a day (BID) | SUBCUTANEOUS | Status: DC

## 2012-03-25 MED ORDER — ONDANSETRON HCL 4 MG PO TABS: ORAL_TABLET | ORAL | Status: DC

## 2012-03-25 MED ORDER — DSS 100 MG PO CAPS: 100.0000 mg | ORAL_CAPSULE | Freq: Two times a day (BID) | ORAL | Status: AC

## 2012-03-25 MED ORDER — TAPENTADOL HCL 50 MG PO TABS
50.0000 mg | ORAL_TABLET | ORAL | Status: DC | PRN
  Administered 2012-03-25: 50 mg via ORAL
  Filled 2012-03-25: qty 1

## 2012-03-25 MED ORDER — BISACODYL 10 MG RE SUPP
10.0000 mg | Freq: Once | RECTAL | Status: AC
  Administered 2012-03-25: 10 mg via RECTAL
  Filled 2012-03-25: qty 1

## 2012-03-25 MED ORDER — CIPROFLOXACIN HCL 500 MG PO TABS: ORAL_TABLET | ORAL | Status: DC

## 2012-03-25 MED ORDER — DEXAMETHASONE SODIUM PHOSPHATE 10 MG/ML IJ SOLN
20.0000 mg | Freq: Once | INTRAMUSCULAR | Status: AC
  Administered 2012-03-25: 20 mg via INTRAMUSCULAR
  Filled 2012-03-25 (×2): qty 2

## 2012-03-25 MED ORDER — TAPENTADOL HCL 50 MG PO TABS: 50.0000 mg | ORAL_TABLET | ORAL | Status: DC | PRN

## 2012-03-25 MED ORDER — SORBITOL 70 % SOLN: 30.0000 mL | Freq: Two times a day (BID) | Status: DC | PRN

## 2012-03-25 NOTE — Progress Notes (Signed)
Patient ID: Kimberly Edwards, female   DOB: 25-Nov-1940, 71 y.o.   MRN: [id number] PATIENT ID: Kimberly Edwards  MRN: [id number]  DOB/AGE:  1941/01/17 / 71 y.o.  2 Days Post-Op Procedure(s) (LRB): TOTAL KNEE ARTHROPLASTY (Right)    PROGRESS NOTE Subjective: Patient is alert, oriented, yes Nausea, yes Vomiting, yes passing gas, no Bowel Movement. Taking PO mild. Denies SOB, Chest or Calf Pain. Using Incentive Spirometer, PAS in place. Ambulate well, CPM 0-90 Patient reports pain as 4 on 0-10 scale  .    Objective: Vital signs in last 24 hours: Filed Vitals:   03/24/12 1000 03/24/12 1327 03/24/12 2103 03/25/12 0600  BP: 125/64 105/61 109/63 111/63  Pulse: 61 59 58 104  Temp:  97.5 F (36.4 C) 99 F (37.2 C) 102.4 F (39.1 C)  TempSrc:    Oral  Resp: 16 18 18 20   SpO2: 96% 96% 92% 94%      Intake/Output from previous day: I/O last 3 completed shifts: In: 3821.7 [P.O.:1080; I.V.:1526.7; NWGNF:6213; IV Piggyback:50] Out: 300 [Urine:300]   Intake/Output this shift:     LABORATORY DATA:  Basename 03/25/12 0500 03/24/12 0500  WBC 6.5 9.8  HGB 11.1* 10.7*  HCT 34.0* 32.4*  PLT 215 188  NA -- 138  K -- 4.0  CL -- 106  CO2 -- 24  BUN -- 18  CREATININE -- 0.61  GLUCOSE -- 146*  GLUCAP -- --  INR -- --  CALCIUM -- 8.1*    Examination: ABD soft Neurovascular intact Sensation intact distally Intact pulses distally Dorsiflexion/Plantar flexion intact Incision: dressing C/D/I}  Assessment:   2 Days Post-Op Procedure(s) (LRB): TOTAL KNEE ARTHROPLASTY (Right) ADDITIONAL DIAGNOSIS:  Acute Blood Loss Anemia and fever  Plan: PT/OT WBAT, CPM 5/hrs day until ROM 0-90 degrees, then D/C CPM DVT Prophylaxis:  SCDx72hrs, ASA 325 mg BID x 2 weeks DISCHARGE PLAN: Skilled Nursing Facility/Rehab DISCHARGE NEEDS: needs Decadron, dulcolax suppository,      Damacio Weisgerber J 03/25/2012, 7:42 AM

## 2012-03-25 NOTE — Discharge Summary (Signed)
Patient ID: Kimberly Edwards MRN: [id number] DOB/AGE: Mar 06, 1941 71 y.o.  Admit date: 03/23/2012 Discharge date: 03/25/2012  Admission Diagnoses:  Principal Problem:  *Right knee DJD Active Problems:  Osteoarthritis  HTN (hypertension)  Hyperlipidemia  Hypertension  Arthritis  Nausea and vomiting  UTI (lower urinary tract infection) E Coli   Discharge Diagnoses:  Principal Problem:  *Right knee DJD Active Problems:  Osteoarthritis  HTN (hypertension)  Hyperlipidemia  Hypertension  Arthritis  Nausea and vomiting  UTI (lower urinary tract infection) E Coli   Surgeries: Procedure(s): TOTAL KNEE ARTHROPLASTY on 03/23/2012   Consultants:    Discharged Condition: Improved  Hospital Course: Kimberly Edwards is an 71 y.o. female who was admitted 03/23/2012 for operative treatment ofRight knee DJD. Patient has severe unremitting pain that affects sleep, daily activities, and work/hobbies. After pre-op clearance the patient was taken to the operating room on 03/23/2012 and underwent  Procedure(s): TOTAL KNEE ARTHROPLASTY.    Patient was given perioperative antibiotics: Anti-infectives     Start     Dose/Rate Route Frequency Ordered Stop   03/25/12 0845   ciprofloxacin (CIPRO) tablet 500 mg        500 mg Oral 2 times daily 03/25/12 0839     03/25/12 0000   ciprofloxacin (CIPRO) 500 MG tablet           03/25/12 0851     03/23/12 1600   ceFAZolin (ANCEF) IVPB 2 g/50 mL premix        2 g 100 mL/hr over 30 Minutes Intravenous Every 6 hours 03/23/12 1302 03/23/12 2253   03/23/12 0948   cefUROXime (ZINACEF) injection  Status:  Discontinued          As needed 03/23/12 0948 03/23/12 1132   03/23/12 0730   ceFAZolin (ANCEF) 2-3 GM-% IVPB SOLR     Comments: MICHAEL, CYNTHIA: cabinet override         03/23/12 0730 03/23/12 1944   03/22/12 1032   ceFAZolin (ANCEF) IVPB 2 g/50 mL premix        2 g 100 mL/hr over 30 Minutes Intravenous 60 min pre-op 03/22/12 1032 03/23/12 0916             Patient was given sequential compression devices, early ambulation, and chemoprophylaxis to prevent DVT.  Patient benefited maximally from hospital stay and there were no complications.    Recent vital signs: Patient Vitals for the past 24 hrs:  BP Temp Temp src Pulse Resp SpO2  03/25/12 0600 111/63 mmHg 102.4 F (39.1 C) Oral 104  20  94 %  04/08/12 2103 109/63 mmHg 99 F (37.2 C) - 58  18  92 %  Apr 08, 2012 1327 105/61 mmHg 97.5 F (36.4 C) - 59  18  96 %  2012-04-08 1000 125/64 mmHg - - 61  16  96 %     Recent laboratory studies:  Basename 03/25/12 0500 04-08-2012 0500  WBC 6.5 9.8  HGB 11.1* 10.7*  HCT 34.0* 32.4*  PLT 215 188  NA 137 138  K 3.8 4.0  CL 101 106  CO2 24 24  BUN 23 18  CREATININE 0.94 0.61  GLUCOSE 149* 146*  INR -- --  CALCIUM 8.2* --     Discharge Medications:   Medication List  As of 03/25/2012  8:51 AM   STOP taking these medications         aspirin 81 MG EC tablet      Fish Oil 1200 MG Caps  GLUCOSAMINE CHONDR COMPLEX PO      HYDROcodone-acetaminophen 5-325 MG per tablet         TAKE these medications         amoxicillin 500 MG capsule   Commonly known as: AMOXIL   Take 2,000 mg by mouth once. takes prior to dental cleaning      celecoxib 200 MG capsule   Commonly known as: CELEBREX   Take 1 capsule (200 mg total) by mouth daily.      ciprofloxacin 500 MG tablet   Commonly known as: CIPRO   1 tab 2 times a day until 04/08/2012      citalopram 10 MG tablet   Commonly known as: CELEXA   Take 1 tablet (10 mg total) by mouth daily.      diltiazem 300 MG 24 hr capsule   Commonly known as: TIAZAC   Take 1 capsule (300 mg total) by mouth daily.      DSS 100 MG Caps   Take 100 mg by mouth 2 (two) times daily.      enoxaparin 30 MG/0.3ML injection   Commonly known as: LOVENOX   Inject 0.3 mLs (30 mg total) into the skin every 12 (twelve) hours.      lisinopril 40 MG tablet   Commonly known as: PRINIVIL,ZESTRIL   Take 1  tablet (40 mg total) by mouth daily.      multivitamin with minerals tablet   Take 1 tablet by mouth daily.      ondansetron 4 MG tablet   Commonly known as: ZOFRAN   1-2 tab po q 6-8 hrs prn nausea      pravastatin 40 MG tablet   Commonly known as: PRAVACHOL   Take 1 tablet (40 mg total) by mouth daily.      sorbitol 70 % Soln   Take 30 mLs by mouth 2 (two) times daily between meals as needed (as needed for constipation).      tapentadol 50 MG Tabs   Commonly known as: NUCYNTA   Take 1 tablet (50 mg total) by mouth every 4 (four) hours as needed.      triamterene-hydrochlorothiazide 37.5-25 MG per capsule   Commonly known as: DYAZIDE   TAKE ONE CAPSULE BY MOUTH EVERY DAY            Diagnostic Studies: Dg Chest 2 View  03/17/2012  *RADIOLOGY REPORT*  Clinical Data: Preop radiograph  CHEST - 2 VIEW  Comparison: None  Findings: The heart size and mediastinal contours are within normal limits.  Both lungs are clear. Scoliosis deformity affects the thoracic spine.  IMPRESSION:  No active cardiopulmonary abnormalities.  Original Report Authenticated By: Rosealee Albee, M.D.    Disposition: Final discharge disposition not confirmed  Discharge Orders    Future Appointments: Provider: Department: Dept Phone: Center:   06/08/2012 10:30 AM Lelon Perla, DO [facility] [phone] [facility]     Future Orders Please Complete By Expires   Diet - low sodium heart healthy      Call MD / Call 911      Comments:   If you experience chest pain or shortness of breath, CALL 911 and be transported to the hospital emergency room.  If you develope a fever above 101 F, pus (white drainage) or increased drainage or redness at the wound, or calf pain, call your surgeon's office.   Constipation Prevention      Comments:   Drink plenty of fluids.  Prune juice may  be helpful.  You may use a stool softener, such as Colace (over the counter) 100 mg twice a day.  Use MiraLax (over the counter) for  constipation as needed.   Increase activity slowly as tolerated      Discharge instructions      Comments:   Total Knee Replacement Care After Refer to this sheet in the next few weeks. These discharge instructions provide you with general information on caring for yourself after you leave the hospital. Your caregiver may also give you specific instructions. Your treatment has been planned according to the most current medical practices available, but unavoidable complications sometimes occur. If you have any problems or questions after discharge, please call your caregiver. Regaining a near full range of motion of your knee within the first 3 to 6 weeks after surgery is critical. HOME CARE INSTRUCTIONS  You may resume a normal diet and activities as directed.  Perform exercises as directed.  Place yellow foam block, yellow side up under heel at all times except when in CPM or when walking.  DO NOT modify, tear, cut, or change in any way. You will receive physical therapy daily  Take showers instead of baths until informed otherwise.  Change bandages (dressings)daily Do not take over-the-counter or prescription medicines for pain, discomfort, or fever. Eat a well-balanced diet.  Avoid lifting or driving until you are instructed otherwise.  Make an appointment to see your caregiver for stitches (suture) or staple removal as directed.  If you have been sent home with a continuous passive motion machine (CPM machine), 0-90 degrees 6 hrs a day   2 hrs a shift SEEK MEDICAL CARE IF: You have swelling of your calf or leg.  You develop shortness of breath or chest pain.  You have redness, swelling, or increasing pain in the wound.  There is pus or any unusual drainage coming from the surgical site.  You notice a bad smell coming from the surgical site or dressing.  The surgical site breaks open after sutures or staples have been removed.  There is persistent bleeding from the suture or staple line.   You are getting worse or are not improving.  You have any other questions or concerns.  SEEK IMMEDIATE MEDICAL CARE IF:  You have a fever.  You develop a rash.  You have difficulty breathing.  You develop any reaction or side effects to medicines given.  Your knee motion is decreasing rather than improving.  MAKE SURE YOU:  Understand these instructions.  Will watch your condition.  Will get help right away if you are not doing well or get worse.   CPM      Comments:   Continuous passive motion machine (CPM):      Use the CPM from 0 to 90 for 6 hours per day.       You may break it up into 2 or 3 sessions per day.      Use CPM for 2 weeks or until you are told to stop.   TED hose      Comments:   Use stockings (TED hose) for 2 weeks on both leg(s).  You may remove them at night for sleeping.   Change dressing      Comments:   Change the dressing daily with sterile 4 x 4 inch gauze dressing and apply TED hose.  You may clean the incision with alcohol prior to redressing.   Do not put a pillow under the knee. Place  it under the heel.      Comments:   Place yellow foam block, yellow side up under heel at all times except when in CPM or when walking.  DO NOT modify, tear, cut, or change in any way the yellow foam block.      Follow-up Information    Follow up with Nilda Simmer, MD on 04/06/2012. (appt time 2:30pm)    Contact information:   [facility] [address], [address] [place] [address] [phone]           Signed: Pascal Lux 03/25/2012, 8:51 AM

## 2012-03-25 NOTE — Progress Notes (Signed)
PT Progress Note:      03/25/12 1200  PT Visit Information  Last PT Received On 03/25/12  Assistance Needed +1  PT Time Calculation  PT Start Time 0944  PT Stop Time 1004  PT Time Calculation (min) 20 min  Precautions  Precautions Fall;Knee  Required Braces or Orthoses Knee Immobilizer - Right  Knee Immobilizer - Right Discontinue post op day 2  Restrictions  RLE Weight Bearing WBAT  Cognition  Overall Cognitive Status Appears within functional limits for tasks assessed/performed  Arousal/Alertness Awake/alert  Orientation Level Appears intact for tasks assessed  Behavior During Session [facility] for tasks performed  Bed Mobility  Bed Mobility Supine to Sit  Supine to Sit 4: Min assist  Details for Bed Mobility Assistance Cues to perform as independently as possibly.  Pt reaching out & requesting for assist to achieve full sitting upright on EOB but could have performed without assist as evident in her ability to do so in previous PT session.    Transfers  Transfers Sit to Stand;Stand to Sit  Sit to Stand 4: Min guard;With upper extremity assist;From bed  Stand to Sit 4: Min guard;With upper extremity assist;With armrests;To chair/3-in-1  Details for Transfer Assistance Cues for safest hand placement & technique.  Cues to ensure balance before initiating ambulation.    Ambulation/Gait  Ambulation/Gait Assistance 4: Min guard  Ambulation Distance (Feet) 140 Feet  Assistive device Rolling walker  Ambulation/Gait Assistance Details Pt able to progress from step-to gait pattern to step-through pattern.  Guarding for safety.  Ambulated without KI.  Knee buckled 3x's throughout.  Pt reports she is nervous but did fairly well.    Gait Pattern Step-to pattern;Step-through pattern;Decreased stride length;Decreased step length - right;Decreased step length - left  Stairs No  Wheelchair Mobility  Wheelchair Mobility No  Balance  Balance Assessed No  Exercises  Exercises Total Joint  Total  Joint Exercises  Ankle Circles/Pumps AROM;20 reps;Both;Supine  Quad Sets AROM;Both;15 reps  Heel Slides AAROM;Right;Other reps (comment) (12 reps)  Straight Leg Raises AROM;Right;Other reps (comment) (12 reps)  Hip ABduction/ADduction AAROM;Right;Other reps (comment) (12 reps)  PT - End of Session  Equipment Utilized During Treatment Gait belt  Activity Tolerance Patient tolerated treatment well  Patient left in chair;with call bell/phone within reach  PT - Assessment/Plan  Comments on Treatment Session Pt making steady progress with mobility.  Pleasant & motivated to participate in therapy.  Plans are for pt to d/c to SNF later today.    PT Plan Discharge plan remains appropriate;Frequency remains appropriate  PT Frequency 7X/week  Follow Up Recommendations Skilled nursing facility  Equipment Recommended Defer to next venue  Acute Rehab PT Goals  Time For Goal Achievement 03/31/12  Potential to Achieve Goals Good  PT Goal: Supine/Side to Sit - Progress Not met  PT Goal: Sit to Stand - Progress Progressing toward goal  PT Goal: Ambulate - Progress Progressing toward goal  PT Goal: Perform Home Exercise Program - Progress Progressing toward goal  PT General Charges  $$ ACUTE PT VISIT 1 Procedure  PT Treatments  $Gait Training 8-22 mins    Pain:  "It's good"   Verdell Face, [place] [phone] 03/25/2012

## 2012-04-08 ENCOUNTER — Emergency Department (HOSPITAL_BASED_OUTPATIENT_CLINIC_OR_DEPARTMENT_OTHER)
Admission: EM | Admit: 2012-04-08 | Discharge: 2012-04-09 | Disposition: A | Discharge status: Home / Self Care | Payer: Medicare Other | Attending: Emergency Medicine | Admitting: Emergency Medicine

## 2012-04-08 ENCOUNTER — Encounter (HOSPITAL_BASED_OUTPATIENT_CLINIC_OR_DEPARTMENT_OTHER): Payer: Self-pay | Admitting: *Deleted

## 2012-04-08 ENCOUNTER — Emergency Department (HOSPITAL_BASED_OUTPATIENT_CLINIC_OR_DEPARTMENT_OTHER): Payer: Medicare Other

## 2012-04-08 DIAGNOSIS — I10 Essential (primary) hypertension: Secondary | ICD-10-CM | POA: Insufficient documentation

## 2012-04-08 DIAGNOSIS — T148XXA Other injury of unspecified body region, initial encounter: Secondary | ICD-10-CM | POA: Insufficient documentation

## 2012-04-08 DIAGNOSIS — Z7982 Long term (current) use of aspirin: Secondary | ICD-10-CM | POA: Insufficient documentation

## 2012-04-08 DIAGNOSIS — M79606 Pain in leg, unspecified: Secondary | ICD-10-CM

## 2012-04-08 DIAGNOSIS — F329 Major depressive disorder, single episode, unspecified: Secondary | ICD-10-CM | POA: Insufficient documentation

## 2012-04-08 DIAGNOSIS — X58XXXA Exposure to other specified factors, initial encounter: Secondary | ICD-10-CM | POA: Insufficient documentation

## 2012-04-08 DIAGNOSIS — Z885 Allergy status to narcotic agent status: Secondary | ICD-10-CM | POA: Insufficient documentation

## 2012-04-08 DIAGNOSIS — M1710 Unilateral primary osteoarthritis, unspecified knee: Secondary | ICD-10-CM | POA: Insufficient documentation

## 2012-04-08 DIAGNOSIS — E785 Hyperlipidemia, unspecified: Secondary | ICD-10-CM | POA: Insufficient documentation

## 2012-04-08 DIAGNOSIS — Z87891 Personal history of nicotine dependence: Secondary | ICD-10-CM | POA: Insufficient documentation

## 2012-04-08 DIAGNOSIS — IMO0002 Reserved for concepts with insufficient information to code with codable children: Secondary | ICD-10-CM | POA: Insufficient documentation

## 2012-04-08 DIAGNOSIS — F3289 Other specified depressive episodes: Secondary | ICD-10-CM | POA: Insufficient documentation

## 2012-04-08 DIAGNOSIS — Z96659 Presence of unspecified artificial knee joint: Secondary | ICD-10-CM | POA: Insufficient documentation

## 2012-04-08 DIAGNOSIS — Z79899 Other long term (current) drug therapy: Secondary | ICD-10-CM | POA: Insufficient documentation

## 2012-04-08 NOTE — ED Notes (Signed)
MD at bedside. 

## 2012-04-08 NOTE — ED Notes (Signed)
Pt states knee replacement 6/10. Today increased walking and now has right calf pain

## 2012-04-09 MED ORDER — IBUPROFEN 600 MG PO TABS: 600.0000 mg | ORAL_TABLET | Freq: Four times a day (QID) | ORAL | Status: AC | PRN

## 2012-04-09 MED ORDER — HYDROCODONE-ACETAMINOPHEN 5-325 MG PO TABS
ORAL_TABLET | ORAL | Status: AC
  Administered 2012-04-09: 0.5 via ORAL
  Filled 2012-04-09: qty 1

## 2012-04-09 MED ORDER — HYDROCODONE-ACETAMINOPHEN 5-325 MG PO TABS
0.5000 | ORAL_TABLET | Freq: Once | ORAL | Status: AC
  Administered 2012-04-09: 0.5 via ORAL

## 2012-04-09 NOTE — ED Provider Notes (Signed)
History     CSN: [id number]  Arrival date & time 04/08/12  2245   First MD Initiated Contact with Patient 04/08/12 2320      Chief Complaint  Patient presents with  . Leg Pain    (Consider location/radiation/quality/duration/timing/severity/associated sxs/prior treatment) Patient is a 71 y.o. female presenting with leg pain. The history is provided by the patient. No language interpreter was used.  Leg Pain  The incident occurred yesterday. The incident occurred in the street. There was no injury mechanism. Pain location: right calf pain after PT and walking in the store. The quality of the pain is described as aching. The pain is at a severity of 6/10. The pain is moderate. The pain has been constant since onset. Pertinent negatives include no numbness, no inability to bear weight, no loss of motion, no muscle weakness, no loss of sensation and no tingling. She reports no foreign bodies present. The symptoms are aggravated by activity. She has tried nothing for the symptoms. The treatment provided no relief.  Sent in for r/o DVT  Past Medical History  Diagnosis Date  . Hypertension   . Hyperlipidemia   . Arthritis   . Right knee DJD   . Knee joint replacement status, left 2003  . PONV (postoperative nausea and vomiting)   . Heart murmur   . Depression     Past Surgical History  Procedure Date  . Cholecystectomy 1998  . Replacement total knee     left knee  . Breast biopsy 2000    marker put in  . Tonsillectomy   . Total knee arthroplasty 03/23/2012    Procedure: TOTAL KNEE ARTHROPLASTY;  Surgeon: Nilda Simmer, MD;  Location: [facility] OR;  Service: Orthopedics;  Laterality: Right;  right total knee arthroplasty  . Joint replacement     Family History  Problem Relation Age of Onset  . Breast cancer Sister   . Hypertension Father     History  Substance Use Topics  . Smoking status: Former Smoker -- 0.2 packs/day for 50 years    Types: Cigarettes  . Smokeless tobacco:  Not on file   Comment: social and weekends alcohol  . Alcohol Use: 0.0 oz/week     <1    OB History    Grav Para Term Preterm Abortions TAB SAB Ect Mult Living                  Review of Systems  Cardiovascular: Negative for leg swelling.  Neurological: Negative for tingling and numbness.  All other systems reviewed and are negative.    Allergies  Oxycodone  Home Medications   Current Outpatient Rx  Name Route Sig Dispense Refill  . ACETAMINOPHEN 500 MG PO TABS Oral Take 1,000 mg by mouth every 6 (six) hours as needed. Patient uses this medication for pain.    [name] Kitchen AMOXICILLIN 500 MG PO CAPS Oral Take 2,000 mg by mouth once. takes prior to dental cleaning    . ASPIRIN 81 MG PO TABS Oral Take 81 mg by mouth daily.    . CELECOXIB 200 MG PO CAPS Oral Take 1 capsule (200 mg total) by mouth daily. 30 capsule 11  . CITALOPRAM HYDROBROMIDE 10 MG PO TABS Oral Take 1 tablet (10 mg total) by mouth daily. 30 tablet 11  . DILTIAZEM HCL ER BEADS 300 MG PO CP24 Oral Take 1 capsule (300 mg total) by mouth daily. 30 capsule 5  . DIPHENHYDRAMINE-APAP (SLEEP) 25-500 MG PO TABS Oral Take  2 tablets by mouth at bedtime as needed. Patient used this medication for sleep.    [name] Kitchen LISINOPRIL 40 MG PO TABS Oral Take 1 tablet (40 mg total) by mouth daily. 30 tablet 5  . MULTI-VITAMIN/MINERALS PO TABS Oral Take 1 tablet by mouth daily.    [name] Kitchen PRAVASTATIN SODIUM 40 MG PO TABS Oral Take 1 tablet (40 mg total) by mouth daily. 30 tablet 2  . SORBITOL 70 % SOLN Oral Take 30 mLs by mouth 2 (two) times daily between meals as needed (as needed for constipation). 300 mL 0  . TRIAMTERENE-HCTZ 37.5-25 MG PO CAPS Oral Take 1 tablet by mouth Daily.      BP 119/65  Pulse 71  Temp 98.6 F (37 C) (Oral)  Resp 16  Ht 4\' 10"  (1.473 m)  Wt 175 lb (79.379 kg)  BMI 36.57 kg/m2  SpO2 98%  Physical Exam  Constitutional: She is oriented to person, place, and time. She appears well-developed and well-nourished. No distress.    HENT:  Head: Normocephalic and atraumatic.  Mouth/Throat: Oropharynx is clear and moist.  Eyes: Conjunctivae are normal. Pupils are equal, round, and reactive to light.  Neck: Normal range of motion. Neck supple.  Cardiovascular: Normal rate and regular rhythm.   Pulmonary/Chest: Effort normal and breath sounds normal. She has no wheezes. She has no rales.  Abdominal: Soft. Bowel sounds are normal. There is no tenderness. There is no rebound and no guarding.  Musculoskeletal: Normal range of motion. She exhibits no edema.       Negative anterior and posterior drawer tests of Right knee.  Incision CDI.  No swelling in the calf RLE neurovascularly intact.  FROm of the right ankle, dorsalis pedis in r foot 2+ cap refill to toes < 2 sec  Neurological: She is alert and oriented to person, place, and time.  Skin: Skin is warm and dry.  Psychiatric: She has a normal mood and affect.    ED Course  Procedures (including critical care time)  Labs Reviewed - No data to display US Venous Img Lower Unilateral Right  04/08/2012  *RADIOLOGY REPORT*  Clinical Data: 71 year old female with lower extremity pain. Recent knee replacement.  RIGHT LOWER EXTREMITY VENOUS DUPLEX ULTRASOUND  Technique:  Gray-scale sonography with graded compression, as well as color Doppler and duplex ultrasound, were performed to evaluate the deep venous system of the lower extremity from the level of the common femoral vein through the popliteal and proximal calf veins. Spectral Doppler was utilized to evaluate flow at rest and with distal augmentation maneuvers.  Comparison:  None.  Findings: Normal compressibility of the right common femoral, superficial femoral, and popliteal veins is demonstrated, as well as the visualized proximal calf veins.  No filling defects to suggest DVT on grayscale or color Doppler imaging.  Doppler waveforms show normal direction of venous flow, normal respiratory phasicity and response to augmentation.   IMPRESSION: No evidence of right lower extremity deep venous thrombosis.  Original Report Authenticated By: Harley Hallmark, M.D.     No diagnosis found.    MDM  Muscle strain likely follow up with Dr. Thurston Hole in am.  Patient verbalizes understanding and agrees to follow up        Yeva Bissette Smitty Cords, MD 04/09/12 1610

## 2012-04-13 DIAGNOSIS — R269 Unspecified abnormalities of gait and mobility: Secondary | ICD-10-CM

## 2012-04-13 DIAGNOSIS — Z96659 Presence of unspecified artificial knee joint: Secondary | ICD-10-CM

## 2012-04-13 DIAGNOSIS — I10 Essential (primary) hypertension: Secondary | ICD-10-CM

## 2012-04-13 DIAGNOSIS — Z471 Aftercare following joint replacement surgery: Secondary | ICD-10-CM

## 2012-04-13 DIAGNOSIS — M6281 Muscle weakness (generalized): Secondary | ICD-10-CM

## 2012-04-15 ENCOUNTER — Ambulatory Visit: Payer: Medicare Other | Attending: Orthopedic Surgery | Admitting: Physical Therapy

## 2012-04-15 DIAGNOSIS — R269 Unspecified abnormalities of gait and mobility: Secondary | ICD-10-CM | POA: Insufficient documentation

## 2012-04-15 DIAGNOSIS — M25669 Stiffness of unspecified knee, not elsewhere classified: Secondary | ICD-10-CM | POA: Insufficient documentation

## 2012-04-15 DIAGNOSIS — M25569 Pain in unspecified knee: Secondary | ICD-10-CM | POA: Insufficient documentation

## 2012-04-15 DIAGNOSIS — IMO0001 Reserved for inherently not codable concepts without codable children: Secondary | ICD-10-CM | POA: Insufficient documentation

## 2012-04-20 ENCOUNTER — Ambulatory Visit: Payer: Medicare Other | Admitting: Rehabilitation

## 2012-04-22 ENCOUNTER — Ambulatory Visit: Payer: Medicare Other | Admitting: Rehabilitation

## 2012-05-01 ENCOUNTER — Other Ambulatory Visit: Payer: Self-pay | Admitting: Family Medicine

## 2012-05-05 ENCOUNTER — Ambulatory Visit: Payer: Medicare Other | Admitting: Rehabilitation

## 2012-05-07 ENCOUNTER — Ambulatory Visit: Payer: Medicare Other | Admitting: Rehabilitation

## 2012-05-11 ENCOUNTER — Ambulatory Visit: Payer: Medicare Other | Admitting: Physical Therapy

## 2012-05-13 ENCOUNTER — Ambulatory Visit: Payer: Medicare Other | Admitting: Physical Therapy

## 2012-05-18 ENCOUNTER — Ambulatory Visit: Payer: Medicare Other | Attending: Orthopedic Surgery | Admitting: Physical Therapy

## 2012-05-18 DIAGNOSIS — R269 Unspecified abnormalities of gait and mobility: Secondary | ICD-10-CM | POA: Insufficient documentation

## 2012-05-18 DIAGNOSIS — IMO0001 Reserved for inherently not codable concepts without codable children: Secondary | ICD-10-CM | POA: Insufficient documentation

## 2012-05-18 DIAGNOSIS — M25569 Pain in unspecified knee: Secondary | ICD-10-CM | POA: Insufficient documentation

## 2012-05-18 DIAGNOSIS — M25669 Stiffness of unspecified knee, not elsewhere classified: Secondary | ICD-10-CM | POA: Insufficient documentation

## 2012-05-20 ENCOUNTER — Ambulatory Visit: Payer: Medicare Other | Admitting: Physical Therapy

## 2012-05-25 ENCOUNTER — Ambulatory Visit: Payer: Medicare Other | Admitting: Rehabilitation

## 2012-05-25 ENCOUNTER — Other Ambulatory Visit: Payer: Self-pay | Admitting: Family Medicine

## 2012-05-25 DIAGNOSIS — E785 Hyperlipidemia, unspecified: Secondary | ICD-10-CM

## 2012-05-25 DIAGNOSIS — I10 Essential (primary) hypertension: Secondary | ICD-10-CM

## 2012-05-25 MED ORDER — PRAVASTATIN SODIUM 40 MG PO TABS: 40.0000 mg | ORAL_TABLET | Freq: Every day | ORAL | Status: DC

## 2012-05-25 MED ORDER — LISINOPRIL 40 MG PO TABS: 40.0000 mg | ORAL_TABLET | Freq: Every day | ORAL | Status: DC

## 2012-05-25 NOTE — Telephone Encounter (Signed)
Refills x 2 last ov 5.22.13 Surgical Clearance  1-Lisinopril (Tab) 40 MG Take 1 tablet (40 mg total) by mouth daily. #30 last fill 6.20.13  2-Pravastatin Sodium (Tab) 40 MG Take 1 tablet (40 mg total) by mouth daily. #30 last fill 6.20.13

## 2012-05-27 ENCOUNTER — Ambulatory Visit: Payer: Medicare Other | Admitting: Physical Therapy

## 2012-06-01 ENCOUNTER — Encounter: Payer: Medicare Other | Admitting: Physical Therapy

## 2012-06-03 ENCOUNTER — Encounter: Payer: Medicare Other | Admitting: Physical Therapy

## 2012-06-04 ENCOUNTER — Other Ambulatory Visit: Payer: Self-pay | Admitting: Family Medicine

## 2012-06-04 DIAGNOSIS — I10 Essential (primary) hypertension: Secondary | ICD-10-CM

## 2012-06-04 MED ORDER — DILTIAZEM HCL ER BEADS 300 MG PO CP24: 300.0000 mg | ORAL_CAPSULE | Freq: Every day | ORAL | Status: DC

## 2012-06-04 NOTE — Telephone Encounter (Signed)
Refill Diltiazem HCl ER Cap 300 MG Take 1 capsule (300 mg total) by mouth every day # 30 last fill 7.22.13 Last ov 5.22.13 OV surgical clear

## 2012-06-08 ENCOUNTER — Encounter: Payer: Self-pay | Admitting: Family Medicine

## 2012-06-08 ENCOUNTER — Ambulatory Visit (INDEPENDENT_AMBULATORY_CARE_PROVIDER_SITE_OTHER): Payer: Medicare Other | Admitting: Family Medicine

## 2012-06-08 VITALS — BP 136/76 | HR 66 | Temp 98.6°F | Ht 59.0 in | Wt 173.8 lb

## 2012-06-08 DIAGNOSIS — R7309 Other abnormal glucose: Secondary | ICD-10-CM

## 2012-06-08 DIAGNOSIS — T148XXA Other injury of unspecified body region, initial encounter: Secondary | ICD-10-CM

## 2012-06-08 DIAGNOSIS — W57XXXA Bitten or stung by nonvenomous insect and other nonvenomous arthropods, initial encounter: Secondary | ICD-10-CM

## 2012-06-08 DIAGNOSIS — E785 Hyperlipidemia, unspecified: Secondary | ICD-10-CM

## 2012-06-08 DIAGNOSIS — Z0000 Encounter for general adult medical examination without abnormal findings: Secondary | ICD-10-CM

## 2012-06-08 DIAGNOSIS — I10 Essential (primary) hypertension: Secondary | ICD-10-CM

## 2012-06-08 DIAGNOSIS — T148 Other injury of unspecified body region: Secondary | ICD-10-CM

## 2012-06-08 DIAGNOSIS — R739 Hyperglycemia, unspecified: Secondary | ICD-10-CM

## 2012-06-08 LAB — POCT URINALYSIS DIPSTICK
Bilirubin, UA: NEGATIVE
Blood, UA: NEGATIVE
Glucose, UA: NEGATIVE
Ketones, UA: NEGATIVE
Nitrite, UA: NEGATIVE
Spec Grav, UA: <=1.005

## 2012-06-08 LAB — CBC WITH DIFFERENTIAL/PLATELET
Basophils Relative: 0.4 % (ref 0.0–3.0)
Eosinophils Absolute: 0.1 10*3/uL (ref 0.0–0.7)
Eosinophils Relative: 1.4 % (ref 0.0–5.0)
HCT: 41.8 % (ref 36.0–46.0)
Lymphs Abs: 2.4 10*3/uL (ref 0.7–4.0)
MCHC: 32.3 g/dL (ref 30.0–36.0)
MCV: 87.2 fl (ref 78.0–100.0)
Monocytes Absolute: 0.5 10*3/uL (ref 0.1–1.0)
Neutro Abs: 4.4 10*3/uL (ref 1.4–7.7)
RBC: 4.8 Mil/uL (ref 3.87–5.11)
WBC: 7.4 10*3/uL (ref 4.5–10.5)

## 2012-06-08 LAB — BASIC METABOLIC PANEL
BUN: 21 mg/dL (ref 6–23)
CO2: 27 mEq/L (ref 19–32)
Chloride: 106 mEq/L (ref 96–112)
Potassium: 4.2 mEq/L (ref 3.5–5.1)

## 2012-06-08 LAB — HEPATIC FUNCTION PANEL
ALT: 16 U/L (ref 0–35)
Albumin: 3.7 g/dL (ref 3.5–5.2)
Total Protein: 7.1 g/dL (ref 6.0–8.3)

## 2012-06-08 LAB — LIPID PANEL
Cholesterol: 189 mg/dL (ref 0–200)
HDL: 53.2 mg/dL (ref >39.00)
Total CHOL/HDL Ratio: 4
Triglycerides: 185 mg/dL — ABNORMAL HIGH (ref 0.0–149.0)

## 2012-06-08 MED ORDER — CLOTRIMAZOLE-BETAMETHASONE 1-0.05 % EX CREA: TOPICAL_CREAM | Freq: Two times a day (BID) | CUTANEOUS | Status: DC

## 2012-06-08 NOTE — Assessment & Plan Note (Signed)
con't meds  Check labs 

## 2012-06-08 NOTE — Progress Notes (Signed)
Subjective:    Kimberly Edwards is a 71 y.o. female who presents for Medicare Annual/Subsequent preventive examination.  Preventive Screening-Counseling & Management  Tobacco History  Smoking status  . Former Smoker -- 0.2 packs/day for 50 years  . Types: Cigarettes  . Quit date: 03/23/2012  Smokeless tobacco  . Not on file  Comment: social and weekends alcohol     Problems Prior to Visit 1.   Current Problems (verified) Patient Active Problem List  Diagnosis  . Osteoarthritis  . HTN (hypertension)  . Hyperlipidemia  . Hypertension  . Arthritis  . Knee joint replacement status, left  . Right knee DJD  . Nausea and vomiting  . UTI (lower urinary tract infection) E Coli    Medications Prior to Visit Current Outpatient Prescriptions on File Prior to Visit  Medication Sig Dispense Refill  . amoxicillin (AMOXIL) 500 MG capsule Take 2,000 mg by mouth once. takes prior to dental cleaning      . aspirin 81 MG tablet Take 81 mg by mouth daily.      . celecoxib (CELEBREX) 200 MG capsule Take 1 capsule (200 mg total) by mouth daily.  30 capsule  11  . citalopram (CELEXA) 10 MG tablet Take 1 tablet (10 mg total) by mouth daily.  30 tablet  11  . diltiazem (TAZTIA XT) 300 MG 24 hr capsule Take 1 capsule (300 mg total) by mouth daily.  30 capsule  2  . diphenhydramine-acetaminophen (TYLENOL PM) 25-500 MG TABS Take 2 tablets by mouth at bedtime as needed. Patient used this medication for sleep.      [name] Kitchen lisinopril (PRINIVIL,ZESTRIL) 40 MG tablet Take 1 tablet (40 mg total) by mouth daily.  30 tablet  5  . Multiple Vitamins-Minerals (MULTIVITAMIN WITH MINERALS) tablet Take 1 tablet by mouth daily.      . pravastatin (PRAVACHOL) 40 MG tablet Take 1 tablet (40 mg total) by mouth daily.  30 tablet  0  . triamterene-hydrochlorothiazide (DYAZIDE) 37.5-25 MG per capsule Take 1 tablet by mouth Daily.      [name] Kitchen acetaminophen (TYLENOL) 500 MG tablet Take 1,000 mg by mouth every 6 (six) hours as  needed. Patient uses this medication for pain.      . sorbitol 70 % SOLN Take 30 mLs by mouth 2 (two) times daily between meals as needed (as needed for constipation).  300 mL  0    Current Medications (verified) Current Outpatient Prescriptions  Medication Sig Dispense Refill  . amoxicillin (AMOXIL) 500 MG capsule Take 2,000 mg by mouth once. takes prior to dental cleaning      . aspirin 81 MG tablet Take 81 mg by mouth daily.      . celecoxib (CELEBREX) 200 MG capsule Take 1 capsule (200 mg total) by mouth daily.  30 capsule  11  . citalopram (CELEXA) 10 MG tablet Take 1 tablet (10 mg total) by mouth daily.  30 tablet  11  . diltiazem (TAZTIA XT) 300 MG 24 hr capsule Take 1 capsule (300 mg total) by mouth daily.  30 capsule  2  . diphenhydramine-acetaminophen (TYLENOL PM) 25-500 MG TABS Take 2 tablets by mouth at bedtime as needed. Patient used this medication for sleep.      [name] Kitchen lisinopril (PRINIVIL,ZESTRIL) 40 MG tablet Take 1 tablet (40 mg total) by mouth daily.  30 tablet  5  . Multiple Vitamins-Minerals (MULTIVITAMIN WITH MINERALS) tablet Take 1 tablet by mouth daily.      . pravastatin (PRAVACHOL) 40 MG  tablet Take 1 tablet (40 mg total) by mouth daily.  30 tablet  0  . triamterene-hydrochlorothiazide (DYAZIDE) 37.5-25 MG per capsule Take 1 tablet by mouth Daily.      [name] Kitchen acetaminophen (TYLENOL) 500 MG tablet Take 1,000 mg by mouth every 6 (six) hours as needed. Patient uses this medication for pain.      . sorbitol 70 % SOLN Take 30 mLs by mouth 2 (two) times daily between meals as needed (as needed for constipation).  300 mL  0     Allergies (verified) Oxycodone   PAST HISTORY  Family History Family History  Problem Relation Age of Onset  . Breast cancer Sister   . Hypertension Father     Social History History  Substance Use Topics  . Smoking status: Former Smoker -- 0.2 packs/day for 50 years    Types: Cigarettes    Quit date: 03/23/2012  . Smokeless tobacco: Not on  file   Comment: social and weekends alcohol  . Alcohol Use: 0.0 oz/week     <1     Are there smokers in your home (other than you)? No  Risk Factors Current exercise habits: Exercise is limited by orthopedic condition(s): recent knee surgery. -- is walking Dietary issues discussed: na   Cardiac risk factors: advanced age (older than 71 for men, 68 for women), dyslipidemia, hypertension, obesity (BMI >= 30 kg/m2), sedentary lifestyle and smoking/ tobacco exposure.  Depression Screen (Note: if answer to either of the following is "Yes", a more complete depression screening is indicated)   Over the past two weeks, have you felt down, depressed or hopeless? No  Over the past two weeks, have you felt little interest or pleasure in doing things? No  Have you lost interest or pleasure in daily life? No  Do you often feel hopeless? No  Do you cry easily over simple problems? No  Activities of Daily Living In your present state of health, do you have any difficulty performing the following activities?:  Driving? No Managing money?  No Feeding yourself? No Getting from bed to chair? No Climbing a flight of stairs? Yes--- since surgery she goes slow Preparing food and eating?: No Bathing or showering? No Getting dressed: No Getting to the toilet? No Using the toilet:No Moving around from place to place: No In the past year have you fallen or had a near fall?:No   Are you sexually active?  No  Do you have more than one partner?  No  Hearing Difficulties: No Do you often ask people to speak up or repeat themselves? No Do you experience ringing or noises in your ears? No Do you have difficulty understanding soft or whispered voices? No   Do you feel that you have a problem with memory? No  Do you often misplace items? No  Do you feel safe at home?  Yes  Cognitive Testing  Alert? Yes  Normal Appearance?Yes  Oriented to person? Yes  Place? Yes   Time? Yes  Recall of three  objects?  Yes  Can perform simple calculations? Yes  Displays appropriate judgment?Yes  Can read the correct time from a watch face?Yes   Advanced Directives have been discussed with the patient? Yes  List the Names of Other Physician/Practitioners you currently use: 1.  Ortho-- [facility] 2 optho-- [facility] 3. [facility]ist[facility]   Indicate any recent Medical Services you may have received from other than [facility] providers in the past year (date may be approximate).  There is no immunization history on file for this patient.  Screening Tests Health Maintenance  Topic Date Due  . Tetanus/tdap  05/04/1958  . Colonoscopy  05/04/1989  . Pneumococcal Polysaccharide Vaccine Age 51 And Over  05/04/2004  . Influenza Vaccine  07/14/2012  . Mammogram  12/30/2012  . Zostavax  Completed    All answers were reviewed with the patient and necessary referrals were made:  Loreen Freud, DO   06/08/2012   History reviewed: allergies, current medications, past family history, past medical history, past social history, past surgical history and problem list  Review of Systems  Review of Systems  Constitutional: Negative for activity change, appetite change and fatigue.  HENT: Negative for hearing loss, congestion, tinnitus and ear discharge.   Eyes: Negative for visual disturbance (see optho q1y -- vision corrected to 20/20 with glasses).  Respiratory: Negative for cough, chest tightness and shortness of breath.   Cardiovascular: Negative for chest pain, palpitations and leg swelling.  Gastrointestinal: Negative for abdominal pain, diarrhea, constipation and abdominal distention.  Genitourinary: Negative for urgency, frequency, decreased urine volume and difficulty urinating.  Musculoskeletal: Negative for back pain, arthralgias and gait problem.  Skin: Negative for color change, pallor and rash.  Neurological: Negative for dizziness, light-headedness, numbness and headaches.  Hematological:  Negative for adenopathy. Does not bruise/bleed easily.  Psychiatric/Behavioral: Negative for suicidal ideas, confusion, sleep disturbance, self-injury, dysphoric mood, decreased concentration and agitation.  Pt is able to read and write and can do all ADLs No risk for falling No abuse/ violence in home    Objective:     Vision by Snellen chart: opth  Body mass index is 35.10 kg/(m^2). BP 136/76  Pulse 66  Temp 98.6 F (37 C) (Oral)  Ht 4\' 11"  (1.499 m)  Wt 173 lb 12.8 oz (78.835 kg)  BMI 35.10 kg/m2  SpO2 97%  BP 136/76  Pulse 66  Temp 98.6 F (37 C) (Oral)  Ht 4\' 11"  (1.499 m)  Wt 173 lb 12.8 oz (78.835 kg)  BMI 35.10 kg/m2  SpO2 97% General appearance: alert, cooperative, appears stated age and no distress Head: Normocephalic, without obvious abnormality, atraumatic Eyes: conjunctivae/corneas clear. PERRL, EOM's intact. Fundi benign. Ears: normal TM's and external ear canals both ears Nose: Nares normal. Septum midline. Mucosa normal. No drainage or sinus tenderness. Throat: lips, mucosa, and tongue normal; teeth and gums normal Neck: no adenopathy, no carotid bruit, no JVD, supple, symmetrical, trachea midline and thyroid not enlarged, symmetric, no tenderness/mass/nodules Back: symmetric, no curvature. ROM normal. No CVA tenderness. Lungs: clear to auscultation bilaterally Breasts: normal appearance, no masses or tenderness Heart: regular rate and rhythm, S1, S2 normal, no murmur, click, rub or gallop Abdomen: soft, non-tender; bowel sounds normal; no masses,  no organomegaly Pelvic: not indicated; post-menopausal, no abnormal Pap smears in past Extremities: extremities normal, atraumatic, no cyanosis or edema Pulses: 2+ and symmetric Skin: Skin color, texture, turgor normal. No rashes or lesions Lymph nodes: Cervical, supraclavicular, and axillary nodes normal. Neurologic: Alert and oriented X 3, normal strength and tone. Normal symmetric reflexes. Normal  coordination and gait Psych-- no depression/ anxiety     Assessment:    cpe     Plan:     During the course of the visit the patient was educated and counseled about appropriate screening and preventive services including:   She refused colonoscopy and pap   Pneumococcal vaccine   Influenza vaccine  Td vaccine  Screening mammography  Bone densitometry screening  Colorectal  cancer screening  Diabetes screening  Glaucoma screening  Advanced directives: has an advanced directive - a copy HAS NOT been provided.  Diet review for nutrition referral? Yes ____  Not Indicated __x__   Patient Instructions (the written plan) was given to the patient.  Medicare Attestation I have personally reviewed: The patient's medical and social history Their use of alcohol, tobacco or illicit drugs Their current medications and supplements The patient's functional ability including ADLs,fall risks, home safety risks, cognitive, and hearing and visual impairment Diet and physical activities Evidence for depression or mood disorders  The patient's weight, height, BMI, and visual acuity have been recorded in the chart.  I have made referrals, counseling, and provided education to the patient based on review of the above and I have provided the patient with a written personalized care plan for preventive services.     Loreen Freud, DO   06/08/2012

## 2012-06-08 NOTE — Patient Instructions (Addendum)
Preventive Care for Adults, Female A healthy lifestyle and preventive care can promote health and wellness. Preventive health guidelines for women include the following key practices.  A routine yearly physical is a good way to check with your caregiver about your health and preventive screening. It is a chance to share any concerns and updates on your health, and to receive a thorough exam.   Visit your dentist for a routine exam and preventive care every 6 months. Brush your teeth twice a day and floss once a day. Good oral hygiene prevents tooth decay and gum disease.   The frequency of eye exams is based on your age, health, family medical history, use of contact lenses, and other factors. Follow your caregiver's recommendations for frequency of eye exams.   Eat a healthy diet. Foods like vegetables, fruits, whole grains, low-fat dairy products, and lean protein foods contain the nutrients you need without too many calories. Decrease your intake of foods high in solid fats, added sugars, and salt. Eat the right amount of calories for you.Get information about a proper diet from your caregiver, if necessary.   Regular physical exercise is one of the most important things you can do for your health. Most adults should get at least 150 minutes of moderate-intensity exercise (any activity that increases your heart rate and causes you to sweat) each week. In addition, most adults need muscle-strengthening exercises on 2 or more days a week.   Maintain a healthy weight. The body mass index (BMI) is a screening tool to identify possible weight problems. It provides an estimate of body fat based on height and weight. Your caregiver can help determine your BMI, and can help you achieve or maintain a healthy weight.For adults 20 years and older:   A BMI below 18.5 is considered underweight.   A BMI of 18.5 to 24.9 is normal.   A BMI of 25 to 29.9 is considered overweight.   A BMI of 30 and above is  considered obese.   Maintain normal blood lipids and cholesterol levels by exercising and minimizing your intake of saturated fat. Eat a balanced diet with plenty of fruit and vegetables. Blood tests for lipids and cholesterol should begin at age 20 and be repeated every 5 years. If your lipid or cholesterol levels are high, you are over 50, or you are at high risk for heart disease, you may need your cholesterol levels checked more frequently.Ongoing high lipid and cholesterol levels should be treated with medicines if diet and exercise are not effective.   If you smoke, find out from your caregiver how to quit. If you do not use tobacco, do not start.   If you are pregnant, do not drink alcohol. If you are breastfeeding, be very cautious about drinking alcohol. If you are not pregnant and choose to drink alcohol, do not exceed 1 drink per day. One drink is considered to be 12 ounces (355 mL) of beer, 5 ounces (148 mL) of wine, or 1.5 ounces (44 mL) of liquor.   Avoid use of street drugs. Do not share needles with anyone. Ask for help if you need support or instructions about stopping the use of drugs.   High blood pressure causes heart disease and increases the risk of stroke. Your blood pressure should be checked at least every 1 to 2 years. Ongoing high blood pressure should be treated with medicines if weight loss and exercise are not effective.   If you are 55 to 73   years old, ask your caregiver if you should take aspirin to prevent strokes.   Diabetes screening involves taking a blood sample to check your fasting blood sugar level. This should be done once every 3 years, after age 45, if you are within normal weight and without risk factors for diabetes. Testing should be considered at a younger age or be carried out more frequently if you are overweight and have at least 1 risk factor for diabetes.   Breast cancer screening is essential preventive care for women. You should practice "breast  self-awareness." This means understanding the normal appearance and feel of your breasts and may include breast self-examination. Any changes detected, no matter how small, should be reported to a caregiver. Women in their 20s and 30s should have a clinical breast exam (CBE) by a caregiver as part of a regular health exam every 1 to 3 years. After age 40, women should have a CBE every year. Starting at age 40, women should consider having a mammography (breast X-ray test) every year. Women who have a family history of breast cancer should talk to their caregiver about genetic screening. Women at a high risk of breast cancer should talk to their caregivers about having magnetic resonance imaging (MRI) and a mammography every year.   The Pap test is a screening test for cervical cancer. A Pap test can show cell changes on the cervix that might become cervical cancer if left untreated. A Pap test is a procedure in which cells are obtained and examined from the lower end of the uterus (cervix).   Women should have a Pap test starting at age 21.   Between ages 21 and 29, Pap tests should be repeated every 2 years.   Beginning at age 30, you should have a Pap test every 3 years as long as the past 3 Pap tests have been normal.   Some women have medical problems that increase the chance of getting cervical cancer. Talk to your caregiver about these problems. It is especially important to talk to your caregiver if a new problem develops soon after your last Pap test. In these cases, your caregiver may recommend more frequent screening and Pap tests.   The above recommendations are the same for women who have or have not gotten the vaccine for human papillomavirus (HPV).   If you had a hysterectomy for a problem that was not cancer or a condition that could lead to cancer, then you no longer need Pap tests. Even if you no longer need a Pap test, a regular exam is a good idea to make sure no other problems are  starting.   If you are between ages 65 and 70, and you have had normal Pap tests going back 10 years, you no longer need Pap tests. Even if you no longer need a Pap test, a regular exam is a good idea to make sure no other problems are starting.   If you have had past treatment for cervical cancer or a condition that could lead to cancer, you need Pap tests and screening for cancer for at least 20 years after your treatment.   If Pap tests have been discontinued, risk factors (such as a new sexual partner) need to be reassessed to determine if screening should be resumed.   The HPV test is an additional test that may be used for cervical cancer screening. The HPV test looks for the virus that can cause the cell changes on the cervix.   The cells collected during the Pap test can be tested for HPV. The HPV test could be used to screen women aged 30 years and older, and should be used in women of any age who have unclear Pap test results. After the age of 30, women should have HPV testing at the same frequency as a Pap test.   Colorectal cancer can be detected and often prevented. Most routine colorectal cancer screening begins at the age of 50 and continues through age 75. However, your caregiver may recommend screening at an earlier age if you have risk factors for colon cancer. On a yearly basis, your caregiver may provide home test kits to check for hidden blood in the stool. Use of a small camera at the end of a tube, to directly examine the colon (sigmoidoscopy or colonoscopy), can detect the earliest forms of colorectal cancer. Talk to your caregiver about this at age 50, when routine screening begins. Direct examination of the colon should be repeated every 5 to 10 years through age 75, unless early forms of pre-cancerous polyps or small growths are found.   Hepatitis C blood testing is recommended for all people born from 1945 through 1965 and any individual with known risks for hepatitis C.    Practice safe sex. Use condoms and avoid high-risk sexual practices to reduce the spread of sexually transmitted infections (STIs). STIs include gonorrhea, chlamydia, syphilis, trichomonas, herpes, HPV, and human immunodeficiency virus (HIV). Herpes, HIV, and HPV are viral illnesses that have no cure. They can result in disability, cancer, and death. Sexually active women aged 25 and younger should be checked for chlamydia. Older women with new or multiple partners should also be tested for chlamydia. Testing for other STIs is recommended if you are sexually active and at increased risk.   Osteoporosis is a disease in which the bones lose minerals and strength with aging. This can result in serious bone fractures. The risk of osteoporosis can be identified using a bone density scan. Women ages 65 and over and women at risk for fractures or osteoporosis should discuss screening with their caregivers. Ask your caregiver whether you should take a calcium supplement or vitamin D to reduce the rate of osteoporosis.   Menopause can be associated with physical symptoms and risks. Hormone replacement therapy is available to decrease symptoms and risks. You should talk to your caregiver about whether hormone replacement therapy is right for you.   Use sunscreen with sun protection factor (SPF) of 30 or more. Apply sunscreen liberally and repeatedly throughout the day. You should seek shade when your shadow is shorter than you. Protect yourself by wearing long sleeves, pants, a wide-brimmed hat, and sunglasses year round, whenever you are outdoors.   Once a month, do a whole body skin exam, using a mirror to look at the skin on your back. Notify your caregiver of new moles, moles that have irregular borders, moles that are larger than a pencil eraser, or moles that have changed in shape or color.   Stay current with required immunizations.   Influenza. You need a dose every fall (or winter). The composition of  the flu vaccine changes each year, so being vaccinated once is not enough.   Pneumococcal polysaccharide. You need 1 to 2 doses if you smoke cigarettes or if you have certain chronic medical conditions. You need 1 dose at age 65 (or older) if you have never been vaccinated.   Tetanus, diphtheria, pertussis (Tdap, Td). Get 1 dose of   Tdap vaccine if you are younger than age 65, are over 65 and have contact with an infant, are a healthcare worker, are pregnant, or simply want to be protected from whooping cough. After that, you need a Td booster dose every 10 years. Consult your caregiver if you have not had at least 3 tetanus and diphtheria-containing shots sometime in your life or have a deep or dirty wound.   HPV. You need this vaccine if you are a woman age 26 or younger. The vaccine is given in 3 doses over 6 months.   Measles, mumps, rubella (MMR). You need at least 1 dose of MMR if you were born in 1957 or later. You may also need a second dose.   Meningococcal. If you are age 19 to 21 and a first-year college student living in a residence hall, or have one of several medical conditions, you need to get vaccinated against meningococcal disease. You may also need additional booster doses.   Zoster (shingles). If you are age 60 or older, you should get this vaccine.   Varicella (chickenpox). If you have never had chickenpox or you were vaccinated but received only 1 dose, talk to your caregiver to find out if you need this vaccine.   Hepatitis A. You need this vaccine if you have a specific risk factor for hepatitis A virus infection or you simply wish to be protected from this disease. The vaccine is usually given as 2 doses, 6 to 18 months apart.   Hepatitis B. You need this vaccine if you have a specific risk factor for hepatitis B virus infection or you simply wish to be protected from this disease. The vaccine is given in 3 doses, usually over 6 months.  Preventive Services /  Frequency Ages 19 to 39  Blood pressure check.** / Every 1 to 2 years.   Lipid and cholesterol check.** / Every 5 years beginning at age 20.   Clinical breast exam.** / Every 3 years for women in their 20s and 30s.   Pap test.** / Every 2 years from ages 21 through 29. Every 3 years starting at age 30 through age 65 or 70 with a history of 3 consecutive normal Pap tests.   HPV screening.** / Every 3 years from ages 30 through ages 65 to 70 with a history of 3 consecutive normal Pap tests.   Hepatitis C blood test.** / For any individual with known risks for hepatitis C.   Skin self-exam. / Monthly.   Influenza immunization.** / Every year.   Pneumococcal polysaccharide immunization.** / 1 to 2 doses if you smoke cigarettes or if you have certain chronic medical conditions.   Tetanus, diphtheria, pertussis (Tdap, Td) immunization. / A one-time dose of Tdap vaccine. After that, you need a Td booster dose every 10 years.   HPV immunization. / 3 doses over 6 months, if you are 26 and younger.   Measles, mumps, rubella (MMR) immunization. / You need at least 1 dose of MMR if you were born in 1957 or later. You may also need a second dose.   Meningococcal immunization. / 1 dose if you are age 19 to 21 and a first-year college student living in a residence hall, or have one of several medical conditions, you need to get vaccinated against meningococcal disease. You may also need additional booster doses.   Varicella immunization.** / Consult your caregiver.   Hepatitis A immunization.** / Consult your caregiver. 2 doses, 6 to 18 months   apart.   Hepatitis B immunization.** / Consult your caregiver. 3 doses usually over 6 months.  Ages 40 to 64  Blood pressure check.** / Every 1 to 2 years.   Lipid and cholesterol check.** / Every 5 years beginning at age 20.   Clinical breast exam.** / Every year after age 40.   Mammogram.** / Every year beginning at age 40 and continuing for as  long as you are in good health. Consult with your caregiver.   Pap test.** / Every 3 years starting at age 30 through age 65 or 70 with a history of 3 consecutive normal Pap tests.   HPV screening.** / Every 3 years from ages 30 through ages 65 to 70 with a history of 3 consecutive normal Pap tests.   Fecal occult blood test (FOBT) of stool. / Every year beginning at age 50 and continuing until age 75. You may not need to do this test if you get a colonoscopy every 10 years.   Flexible sigmoidoscopy or colonoscopy.** / Every 5 years for a flexible sigmoidoscopy or every 10 years for a colonoscopy beginning at age 50 and continuing until age 75.   Hepatitis C blood test.** / For all people born from 1945 through 1965 and any individual with known risks for hepatitis C.   Skin self-exam. / Monthly.   Influenza immunization.** / Every year.   Pneumococcal polysaccharide immunization.** / 1 to 2 doses if you smoke cigarettes or if you have certain chronic medical conditions.   Tetanus, diphtheria, pertussis (Tdap, Td) immunization.** / A one-time dose of Tdap vaccine. After that, you need a Td booster dose every 10 years.   Measles, mumps, rubella (MMR) immunization. / You need at least 1 dose of MMR if you were born in 1957 or later. You may also need a second dose.   Varicella immunization.** / Consult your caregiver.   Meningococcal immunization.** / Consult your caregiver.   Hepatitis A immunization.** / Consult your caregiver. 2 doses, 6 to 18 months apart.   Hepatitis B immunization.** / Consult your caregiver. 3 doses, usually over 6 months.  Ages 65 and over  Blood pressure check.** / Every 1 to 2 years.   Lipid and cholesterol check.** / Every 5 years beginning at age 20.   Clinical breast exam.** / Every year after age 40.   Mammogram.** / Every year beginning at age 40 and continuing for as long as you are in good health. Consult with your caregiver.   Pap test.** /  Every 3 years starting at age 30 through age 65 or 70 with a 3 consecutive normal Pap tests. Testing can be stopped between 65 and 70 with 3 consecutive normal Pap tests and no abnormal Pap or HPV tests in the past 10 years.   HPV screening.** / Every 3 years from ages 30 through ages 65 or 70 with a history of 3 consecutive normal Pap tests. Testing can be stopped between 65 and 70 with 3 consecutive normal Pap tests and no abnormal Pap or HPV tests in the past 10 years.   Fecal occult blood test (FOBT) of stool. / Every year beginning at age 50 and continuing until age 75. You may not need to do this test if you get a colonoscopy every 10 years.   Flexible sigmoidoscopy or colonoscopy.** / Every 5 years for a flexible sigmoidoscopy or every 10 years for a colonoscopy beginning at age 50 and continuing until age 75.   Hepatitis   C blood test.** / For all people born from 1945 through 1965 and any individual with known risks for hepatitis C.   Osteoporosis screening.** / A one-time screening for women ages 65 and over and women at risk for fractures or osteoporosis.   Skin self-exam. / Monthly.   Influenza immunization.** / Every year.   Pneumococcal polysaccharide immunization.** / 1 dose at age 65 (or older) if you have never been vaccinated.   Tetanus, diphtheria, pertussis (Tdap, Td) immunization. / A one-time dose of Tdap vaccine if you are over 65 and have contact with an infant, are a healthcare worker, or simply want to be protected from whooping cough. After that, you need a Td booster dose every 10 years.   Varicella immunization.** / Consult your caregiver.   Meningococcal immunization.** / Consult your caregiver.   Hepatitis A immunization.** / Consult your caregiver. 2 doses, 6 to 18 months apart.   Hepatitis B immunization.** / Check with your caregiver. 3 doses, usually over 6 months.  ** Family history and personal history of risk and conditions may change your caregiver's  recommendations. Document Released: 11/26/2001 Document Revised: 09/19/2011 Document Reviewed: 02/25/2011 [employer] Patient Information 2012 [employer]. 

## 2012-06-08 NOTE — Assessment & Plan Note (Signed)
Stable con't meds 

## 2012-07-06 ENCOUNTER — Telehealth: Payer: Self-pay | Admitting: Family Medicine

## 2012-07-06 DIAGNOSIS — E785 Hyperlipidemia, unspecified: Secondary | ICD-10-CM

## 2012-07-06 MED ORDER — PRAVASTATIN SODIUM 40 MG PO TABS: 40.0000 mg | ORAL_TABLET | Freq: Every day | ORAL | Status: DC

## 2012-07-06 NOTE — Telephone Encounter (Signed)
Refill: Pravastatin sodium 40mg  tab. Take 1 tablet by mouth daily. Qty 30. Last fill 05-25-12

## 2012-07-13 ENCOUNTER — Telehealth: Payer: Self-pay | Admitting: Family Medicine

## 2012-07-13 MED ORDER — TRIAMTERENE-HCTZ 37.5-25 MG PO CAPS: 1.0000 | ORAL_CAPSULE | Freq: Every day | ORAL | Status: DC

## 2012-07-13 NOTE — Telephone Encounter (Signed)
Refill: Triamterene-hctz 37.5-25 mg cap. Take one capsule by mouth every day. Qty 30. Last fill 9.2.13

## 2012-09-01 ENCOUNTER — Other Ambulatory Visit: Payer: Self-pay | Admitting: Family Medicine

## 2012-09-01 DIAGNOSIS — I10 Essential (primary) hypertension: Secondary | ICD-10-CM

## 2012-09-01 MED ORDER — DILTIAZEM HCL ER BEADS 300 MG PO CP24: 300.0000 mg | ORAL_CAPSULE | Freq: Every day | ORAL | Status: DC

## 2012-09-01 NOTE — Telephone Encounter (Signed)
refill diltiazem hcl er 300mg  cap #30 wt/2-refills Tkae one capsule by mouth daily--last fill 10.19.13

## 2012-11-24 ENCOUNTER — Other Ambulatory Visit: Payer: Self-pay | Admitting: Family Medicine

## 2012-11-24 DIAGNOSIS — Z1231 Encounter for screening mammogram for malignant neoplasm of breast: Secondary | ICD-10-CM

## 2012-12-01 ENCOUNTER — Other Ambulatory Visit: Payer: Self-pay | Admitting: Family Medicine

## 2012-12-09 ENCOUNTER — Encounter: Payer: Self-pay | Admitting: Family Medicine

## 2012-12-09 ENCOUNTER — Ambulatory Visit (INDEPENDENT_AMBULATORY_CARE_PROVIDER_SITE_OTHER): Payer: BC Managed Care – PPO | Admitting: Family Medicine

## 2012-12-09 VITALS — BP 148/90 | HR 75 | Temp 98.4°F | Wt 173.8 lb

## 2012-12-09 DIAGNOSIS — F329 Major depressive disorder, single episode, unspecified: Secondary | ICD-10-CM

## 2012-12-09 DIAGNOSIS — I10 Essential (primary) hypertension: Secondary | ICD-10-CM

## 2012-12-09 DIAGNOSIS — N390 Urinary tract infection, site not specified: Secondary | ICD-10-CM

## 2012-12-09 DIAGNOSIS — F3289 Other specified depressive episodes: Secondary | ICD-10-CM

## 2012-12-09 DIAGNOSIS — M1990 Unspecified osteoarthritis, unspecified site: Secondary | ICD-10-CM

## 2012-12-09 DIAGNOSIS — R300 Dysuria: Secondary | ICD-10-CM

## 2012-12-09 DIAGNOSIS — E785 Hyperlipidemia, unspecified: Secondary | ICD-10-CM

## 2012-12-09 LAB — POCT URINALYSIS DIPSTICK
Protein, UA: NEGATIVE
Urobilinogen, UA: 0.2

## 2012-12-09 LAB — LIPID PANEL
LDL Cholesterol: 112 mg/dL — ABNORMAL HIGH (ref 0–99)
Total CHOL/HDL Ratio: 4
VLDL: 35.4 mg/dL (ref 0.0–40.0)

## 2012-12-09 LAB — HEPATIC FUNCTION PANEL
AST: 20 U/L (ref 0–37)
Alkaline Phosphatase: 73 U/L (ref 39–117)
Bilirubin, Direct: 0.1 mg/dL (ref 0.0–0.3)
Total Bilirubin: 0.7 mg/dL (ref 0.3–1.2)

## 2012-12-09 LAB — BASIC METABOLIC PANEL
GFR: 67.72 mL/min (ref >60.00)
Glucose, Bld: 90 mg/dL (ref 70–99)
Potassium: 4.5 mEq/L (ref 3.5–5.1)
Sodium: 141 mEq/L (ref 135–145)

## 2012-12-09 MED ORDER — DILTIAZEM HCL ER BEADS 300 MG PO CP24: 300.0000 mg | ORAL_CAPSULE | Freq: Every day | ORAL | Status: DC

## 2012-12-09 MED ORDER — PRAVASTATIN SODIUM 40 MG PO TABS: 40.0000 mg | ORAL_TABLET | Freq: Every day | ORAL | Status: DC

## 2012-12-09 MED ORDER — CELECOXIB 200 MG PO CAPS: 200.0000 mg | ORAL_CAPSULE | Freq: Every day | ORAL | Status: DC

## 2012-12-09 MED ORDER — CITALOPRAM HYDROBROMIDE 10 MG PO TABS: ORAL_TABLET | ORAL | Status: DC

## 2012-12-09 MED ORDER — LISINOPRIL 40 MG PO TABS: 40.0000 mg | ORAL_TABLET | Freq: Every day | ORAL | Status: DC

## 2012-12-09 MED ORDER — TRIAMTERENE-HCTZ 37.5-25 MG PO CAPS: 1.0000 | ORAL_CAPSULE | Freq: Every day | ORAL | Status: DC

## 2012-12-09 NOTE — Patient Instructions (Addendum)

## 2012-12-10 DIAGNOSIS — R300 Dysuria: Secondary | ICD-10-CM | POA: Insufficient documentation

## 2012-12-10 NOTE — Progress Notes (Signed)
  Subjective:    Patient here for follow-up of elevated blood pressure.  She is not exercising and is adherent to a low-salt diet.  Blood pressure is well controlled at home. Cardiac symptoms: none. Patient denies: chest pain, chest pressure/discomfort, claudication, dyspnea, exertional chest pressure/discomfort, fatigue, irregular heart beat, lower extremity edema, near-syncope, orthopnea, palpitations, paroxysmal nocturnal dyspnea, syncope and tachypnea. Cardiovascular risk factors: advanced age (older than 77 for men, 51 for women), dyslipidemia, hypertension and sedentary lifestyle. Use of agents associated with hypertension: none. History of target organ damage: none.  Pt is also here to f/u cholesterol and get refills and labs.  No complaints.  The following portions of the patient's history were reviewed and updated as appropriate: allergies, current medications, past family history, past medical history, past social history, past surgical history and problem list.  Review of Systems Pertinent items are noted in HPI.     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 Evidence of target organ damage: none.    Plan:    Medication: no change. Dietary sodium restriction. Regular aerobic exercise. Check blood pressures 2-3 times weekly and record. Follow up: 3 months and as needed.

## 2012-12-10 NOTE — Assessment & Plan Note (Signed)
Check labs 

## 2012-12-10 NOTE — Assessment & Plan Note (Signed)
con't meds  Check labs 

## 2012-12-10 NOTE — Assessment & Plan Note (Signed)
Culture pending

## 2012-12-12 LAB — URINE CULTURE

## 2012-12-15 MED ORDER — CIPROFLOXACIN HCL 500 MG PO TABS: 500.0000 mg | ORAL_TABLET | Freq: Two times a day (BID) | ORAL | Status: DC

## 2012-12-20 ENCOUNTER — Other Ambulatory Visit: Payer: Self-pay | Admitting: Family Medicine

## 2013-01-06 ENCOUNTER — Ambulatory Visit
Admission: RE | Admit: 2013-01-06 | Discharge: 2013-01-06 | Disposition: A | Discharge status: Home / Self Care | Payer: Medicare Other | Source: Ambulatory Visit | Attending: Family Medicine | Admitting: Family Medicine

## 2013-01-06 DIAGNOSIS — Z1231 Encounter for screening mammogram for malignant neoplasm of breast: Secondary | ICD-10-CM

## 2013-01-07 ENCOUNTER — Telehealth: Payer: Self-pay | Admitting: Family Medicine

## 2013-01-07 NOTE — Telephone Encounter (Signed)
REFILLS NEEDED ON TRIAMTERENE-HCTZ 37.5-25 MG CP #30 SIG: TAKE ONE CAPSULE BY MOUTH DAILY LAST FILLED : 02.28.2014      AUTH REFILLS:5

## 2013-01-07 NOTE — Telephone Encounter (Signed)
Rx sent on 12-09-12 # 90 3 spoke with pharmacy Rx received can disregard request.

## 2013-01-11 ENCOUNTER — Other Ambulatory Visit: Payer: Self-pay | Admitting: Family Medicine

## 2013-01-11 DIAGNOSIS — R928 Other abnormal and inconclusive findings on diagnostic imaging of breast: Secondary | ICD-10-CM

## 2013-01-15 ENCOUNTER — Ambulatory Visit
Admission: RE | Admit: 2013-01-15 | Discharge: 2013-01-15 | Disposition: A | Discharge status: Home / Self Care | Payer: Medicare Other | Source: Ambulatory Visit | Attending: Family Medicine | Admitting: Family Medicine

## 2013-01-15 DIAGNOSIS — R928 Other abnormal and inconclusive findings on diagnostic imaging of breast: Secondary | ICD-10-CM

## 2013-03-02 ENCOUNTER — Other Ambulatory Visit: Payer: Self-pay | Admitting: Family Medicine

## 2013-03-15 ENCOUNTER — Other Ambulatory Visit: Payer: Self-pay | Admitting: General Practice

## 2013-03-15 MED ORDER — AMOXICILLIN 500 MG PO CAPS: 2000.0000 mg | ORAL_CAPSULE | Freq: Once | ORAL | Status: DC

## 2013-03-15 NOTE — Telephone Encounter (Signed)
Med filled as pt takes this prior to dental cleaning.

## 2013-05-13 ENCOUNTER — Other Ambulatory Visit: Payer: Self-pay | Admitting: *Deleted

## 2013-05-13 MED ORDER — PRAVASTATIN SODIUM 40 MG PO TABS: ORAL_TABLET | ORAL | Status: DC

## 2013-05-13 NOTE — Telephone Encounter (Signed)
Rx has been filled for a 90 day supply with 1 R of pravastatin 40 mg.

## 2013-06-10 ENCOUNTER — Encounter: Payer: BC Managed Care – PPO | Admitting: Family Medicine

## 2013-06-18 ENCOUNTER — Other Ambulatory Visit: Payer: Self-pay | Admitting: Family Medicine

## 2013-07-01 ENCOUNTER — Encounter: Payer: Self-pay | Admitting: Family Medicine

## 2013-07-01 ENCOUNTER — Ambulatory Visit (INDEPENDENT_AMBULATORY_CARE_PROVIDER_SITE_OTHER): Payer: Medicare Other | Admitting: Family Medicine

## 2013-07-01 VITALS — BP 128/80 | HR 67 | Temp 97.7°F | Resp 12 | Ht 59.0 in | Wt 177.0 lb

## 2013-07-01 DIAGNOSIS — Z0000 Encounter for general adult medical examination without abnormal findings: Secondary | ICD-10-CM

## 2013-07-01 DIAGNOSIS — E785 Hyperlipidemia, unspecified: Secondary | ICD-10-CM

## 2013-07-01 DIAGNOSIS — E669 Obesity, unspecified: Secondary | ICD-10-CM | POA: Insufficient documentation

## 2013-07-01 DIAGNOSIS — Z23 Encounter for immunization: Secondary | ICD-10-CM

## 2013-07-01 DIAGNOSIS — I10 Essential (primary) hypertension: Secondary | ICD-10-CM

## 2013-07-01 LAB — HEPATIC FUNCTION PANEL
ALT: 14 U/L (ref 0–35)
AST: 19 U/L (ref 0–37)
Albumin: 3.8 g/dL (ref 3.5–5.2)
Total Protein: 7.5 g/dL (ref 6.0–8.3)

## 2013-07-01 LAB — BASIC METABOLIC PANEL
GFR: 58.94 mL/min — ABNORMAL LOW (ref >60.00)
Glucose, Bld: 95 mg/dL (ref 70–99)
Potassium: 4.5 mEq/L (ref 3.5–5.1)
Sodium: 140 mEq/L (ref 135–145)

## 2013-07-01 LAB — CBC WITH DIFFERENTIAL/PLATELET
Basophils Relative: 0.6 % (ref 0.0–3.0)
Eosinophils Relative: 1.9 % (ref 0.0–5.0)
HCT: 44 % (ref 36.0–46.0)
Hemoglobin: 14.8 g/dL (ref 12.0–15.0)
Lymphs Abs: 2.4 10*3/uL (ref 0.7–4.0)
Monocytes Relative: 5.8 % (ref 3.0–12.0)
Neutro Abs: 6.3 10*3/uL (ref 1.4–7.7)
RBC: 5.03 Mil/uL (ref 3.87–5.11)
RDW: 13.5 % (ref 11.5–14.6)
WBC: 9.5 10*3/uL (ref 4.5–10.5)

## 2013-07-01 LAB — LDL CHOLESTEROL, DIRECT: Direct LDL: 106.5 mg/dL

## 2013-07-01 LAB — LIPID PANEL: Cholesterol: 185 mg/dL (ref 0–200)

## 2013-07-01 NOTE — Assessment & Plan Note (Signed)
Check labs con't meds 

## 2013-07-01 NOTE — Progress Notes (Signed)
  Subjective:    Patient ID: Kimberly Edwards, female    DOB: 03/03/1941, 72 y.o.   MRN: [id number]  HPI    Review of Systems     Objective:   Physical Exam  L ear-+ wart ext ear       Assessment & Plan:

## 2013-07-01 NOTE — Patient Instructions (Addendum)
Preventive Care for Adults, Female A healthy lifestyle and preventive care can promote health and wellness. Preventive health guidelines for women include the following key practices.  A routine yearly physical is a good way to check with your caregiver about your health and preventive screening. It is a chance to share any concerns and updates on your health, and to receive a thorough exam.  Visit your dentist for a routine exam and preventive care every 6 months. Brush your teeth twice a day and floss once a day. Good oral hygiene prevents tooth decay and gum disease.  The frequency of eye exams is based on your age, health, family medical history, use of contact lenses, and other factors. Follow your caregiver's recommendations for frequency of eye exams.  Eat a healthy diet. Foods like vegetables, fruits, whole grains, low-fat dairy products, and lean protein foods contain the nutrients you need without too many calories. Decrease your intake of foods high in solid fats, added sugars, and salt. Eat the right amount of calories for you.Get information about a proper diet from your caregiver, if necessary.  Regular physical exercise is one of the most important things you can do for your health. Most adults should get at least 150 minutes of moderate-intensity exercise (any activity that increases your heart rate and causes you to sweat) each week. In addition, most adults need muscle-strengthening exercises on 2 or more days a week.  Maintain a healthy weight. The body mass index (BMI) is a screening tool to identify possible weight problems. It provides an estimate of body fat based on height and weight. Your caregiver can help determine your BMI, and can help you achieve or maintain a healthy weight.For adults 20 years and older:  A BMI below 18.5 is considered underweight.  A BMI of 18.5 to 24.9 is normal.  A BMI of 25 to 29.9 is considered overweight.  A BMI of 30 and above is  considered obese.  Maintain normal blood lipids and cholesterol levels by exercising and minimizing your intake of saturated fat. Eat a balanced diet with plenty of fruit and vegetables. Blood tests for lipids and cholesterol should begin at age 20 and be repeated every 5 years. If your lipid or cholesterol levels are high, you are over 50, or you are at high risk for heart disease, you may need your cholesterol levels checked more frequently.Ongoing high lipid and cholesterol levels should be treated with medicines if diet and exercise are not effective.  If you smoke, find out from your caregiver how to quit. If you do not use tobacco, do not start.  If you are pregnant, do not drink alcohol. If you are breastfeeding, be very cautious about drinking alcohol. If you are not pregnant and choose to drink alcohol, do not exceed 1 drink per day. One drink is considered to be 12 ounces (355 mL) of beer, 5 ounces (148 mL) of wine, or 1.5 ounces (44 mL) of liquor.  Avoid use of street drugs. Do not share needles with anyone. Ask for help if you need support or instructions about stopping the use of drugs.  High blood pressure causes heart disease and increases the risk of stroke. Your blood pressure should be checked at least every 1 to 2 years. Ongoing high blood pressure should be treated with medicines if weight loss and exercise are not effective.  If you are 55 to 74 years old, ask your caregiver if you should take aspirin to prevent strokes.  Diabetes   screening involves taking a blood sample to check your fasting blood sugar level. This should be done once every 3 years, after age 45, if you are within normal weight and without risk factors for diabetes. Testing should be considered at a younger age or be carried out more frequently if you are overweight and have at least 1 risk factor for diabetes.  Breast cancer screening is essential preventive care for women. You should practice "breast  self-awareness." This means understanding the normal appearance and feel of your breasts and may include breast self-examination. Any changes detected, no matter how small, should be reported to a caregiver. Women in their 20s and 30s should have a clinical breast exam (CBE) by a caregiver as part of a regular health exam every 1 to 3 years. After age 40, women should have a CBE every year. Starting at age 40, women should consider having a mammography (breast X-ray test) every year. Women who have a family history of breast cancer should talk to their caregiver about genetic screening. Women at a high risk of breast cancer should talk to their caregivers about having magnetic resonance imaging (MRI) and a mammography every year.  The Pap test is a screening test for cervical cancer. A Pap test can show cell changes on the cervix that might become cervical cancer if left untreated. A Pap test is a procedure in which cells are obtained and examined from the lower end of the uterus (cervix).  Women should have a Pap test starting at age 21.  Between ages 21 and 29, Pap tests should be repeated every 2 years.  Beginning at age 30, you should have a Pap test every 3 years as long as the past 3 Pap tests have been normal.  Some women have medical problems that increase the chance of getting cervical cancer. Talk to your caregiver about these problems. It is especially important to talk to your caregiver if a new problem develops soon after your last Pap test. In these cases, your caregiver may recommend more frequent screening and Pap tests.  The above recommendations are the same for women who have or have not gotten the vaccine for human papillomavirus (HPV).  If you had a hysterectomy for a problem that was not cancer or a condition that could lead to cancer, then you no longer need Pap tests. Even if you no longer need a Pap test, a regular exam is a good idea to make sure no other problems are  starting.  If you are between ages 65 and 70, and you have had normal Pap tests going back 10 years, you no longer need Pap tests. Even if you no longer need a Pap test, a regular exam is a good idea to make sure no other problems are starting.  If you have had past treatment for cervical cancer or a condition that could lead to cancer, you need Pap tests and screening for cancer for at least 20 years after your treatment.  If Pap tests have been discontinued, risk factors (such as a new sexual partner) need to be reassessed to determine if screening should be resumed.  The HPV test is an additional test that may be used for cervical cancer screening. The HPV test looks for the virus that can cause the cell changes on the cervix. The cells collected during the Pap test can be tested for HPV. The HPV test could be used to screen women aged 30 years and older, and should   be used in women of any age who have unclear Pap test results. After the age of 30, women should have HPV testing at the same frequency as a Pap test.  Colorectal cancer can be detected and often prevented. Most routine colorectal cancer screening begins at the age of 50 and continues through age 75. However, your caregiver may recommend screening at an earlier age if you have risk factors for colon cancer. On a yearly basis, your caregiver may provide home test kits to check for hidden blood in the stool. Use of a small camera at the end of a tube, to directly examine the colon (sigmoidoscopy or colonoscopy), can detect the earliest forms of colorectal cancer. Talk to your caregiver about this at age 50, when routine screening begins. Direct examination of the colon should be repeated every 5 to 10 years through age 75, unless early forms of pre-cancerous polyps or small growths are found.  Hepatitis C blood testing is recommended for all people born from 1945 through 1965 and any individual with known risks for hepatitis C.  Practice  safe sex. Use condoms and avoid high-risk sexual practices to reduce the spread of sexually transmitted infections (STIs). STIs include gonorrhea, chlamydia, syphilis, trichomonas, herpes, HPV, and human immunodeficiency virus (HIV). Herpes, HIV, and HPV are viral illnesses that have no cure. They can result in disability, cancer, and death. Sexually active women aged 25 and younger should be checked for chlamydia. Older women with new or multiple partners should also be tested for chlamydia. Testing for other STIs is recommended if you are sexually active and at increased risk.  Osteoporosis is a disease in which the bones lose minerals and strength with aging. This can result in serious bone fractures. The risk of osteoporosis can be identified using a bone density scan. Women ages 65 and over and women at risk for fractures or osteoporosis should discuss screening with their caregivers. Ask your caregiver whether you should take a calcium supplement or vitamin D to reduce the rate of osteoporosis.  Menopause can be associated with physical symptoms and risks. Hormone replacement therapy is available to decrease symptoms and risks. You should talk to your caregiver about whether hormone replacement therapy is right for you.  Use sunscreen with sun protection factor (SPF) of 30 or more. Apply sunscreen liberally and repeatedly throughout the day. You should seek shade when your shadow is shorter than you. Protect yourself by wearing long sleeves, pants, a wide-brimmed hat, and sunglasses year round, whenever you are outdoors.  Once a month, do a whole body skin exam, using a mirror to look at the skin on your back. Notify your caregiver of new moles, moles that have irregular borders, moles that are larger than a pencil eraser, or moles that have changed in shape or color.  Stay current with required immunizations.  Influenza. You need a dose every fall (or winter). The composition of the flu vaccine  changes each year, so being vaccinated once is not enough.  Pneumococcal polysaccharide. You need 1 to 2 doses if you smoke cigarettes or if you have certain chronic medical conditions. You need 1 dose at age 65 (or older) if you have never been vaccinated.  Tetanus, diphtheria, pertussis (Tdap, Td). Get 1 dose of Tdap vaccine if you are younger than age 65, are over 65 and have contact with an infant, are a healthcare worker, are pregnant, or simply want to be protected from whooping cough. After that, you need a Td   booster dose every 10 years. Consult your caregiver if you have not had at least 3 tetanus and diphtheria-containing shots sometime in your life or have a deep or dirty wound.  HPV. You need this vaccine if you are a woman age 26 or younger. The vaccine is given in 3 doses over 6 months.  Measles, mumps, rubella (MMR). You need at least 1 dose of MMR if you were born in 1957 or later. You may also need a second dose.  Meningococcal. If you are age 19 to 21 and a first-year college student living in a residence hall, or have one of several medical conditions, you need to get vaccinated against meningococcal disease. You may also need additional booster doses.  Zoster (shingles). If you are age 60 or older, you should get this vaccine.  Varicella (chickenpox). If you have never had chickenpox or you were vaccinated but received only 1 dose, talk to your caregiver to find out if you need this vaccine.  Hepatitis A. You need this vaccine if you have a specific risk factor for hepatitis A virus infection or you simply wish to be protected from this disease. The vaccine is usually given as 2 doses, 6 to 18 months apart.  Hepatitis B. You need this vaccine if you have a specific risk factor for hepatitis B virus infection or you simply wish to be protected from this disease. The vaccine is given in 3 doses, usually over 6 months. Preventive Services / Frequency Ages 19 to 39  Blood  pressure check.** / Every 1 to 2 years.  Lipid and cholesterol check.** / Every 5 years beginning at age 20.  Clinical breast exam.** / Every 3 years for women in their 20s and 30s.  Pap test.** / Every 2 years from ages 21 through 29. Every 3 years starting at age 30 through age 65 or 70 with a history of 3 consecutive normal Pap tests.  HPV screening.** / Every 3 years from ages 30 through ages 65 to 70 with a history of 3 consecutive normal Pap tests.  Hepatitis C blood test.** / For any individual with known risks for hepatitis C.  Skin self-exam. / Monthly.  Influenza immunization.** / Every year.  Pneumococcal polysaccharide immunization.** / 1 to 2 doses if you smoke cigarettes or if you have certain chronic medical conditions.  Tetanus, diphtheria, pertussis (Tdap, Td) immunization. / A one-time dose of Tdap vaccine. After that, you need a Td booster dose every 10 years.  HPV immunization. / 3 doses over 6 months, if you are 26 and younger.  Measles, mumps, rubella (MMR) immunization. / You need at least 1 dose of MMR if you were born in 1957 or later. You may also need a second dose.  Meningococcal immunization. / 1 dose if you are age 19 to 21 and a first-year college student living in a residence hall, or have one of several medical conditions, you need to get vaccinated against meningococcal disease. You may also need additional booster doses.  Varicella immunization.** / Consult your caregiver.  Hepatitis A immunization.** / Consult your caregiver. 2 doses, 6 to 18 months apart.  Hepatitis B immunization.** / Consult your caregiver. 3 doses usually over 6 months. Ages 40 to 64  Blood pressure check.** / Every 1 to 2 years.  Lipid and cholesterol check.** / Every 5 years beginning at age 20.  Clinical breast exam.** / Every year after age 40.  Mammogram.** / Every year beginning at age 40   and continuing for as long as you are in good health. Consult with your  caregiver.  Pap test.** / Every 3 years starting at age 30 through age 65 or 70 with a history of 3 consecutive normal Pap tests.  HPV screening.** / Every 3 years from ages 30 through ages 65 to 70 with a history of 3 consecutive normal Pap tests.  Fecal occult blood test (FOBT) of stool. / Every year beginning at age 50 and continuing until age 75. You may not need to do this test if you get a colonoscopy every 10 years.  Flexible sigmoidoscopy or colonoscopy.** / Every 5 years for a flexible sigmoidoscopy or every 10 years for a colonoscopy beginning at age 50 and continuing until age 75.  Hepatitis C blood test.** / For all people born from 1945 through 1965 and any individual with known risks for hepatitis C.  Skin self-exam. / Monthly.  Influenza immunization.** / Every year.  Pneumococcal polysaccharide immunization.** / 1 to 2 doses if you smoke cigarettes or if you have certain chronic medical conditions.  Tetanus, diphtheria, pertussis (Tdap, Td) immunization.** / A one-time dose of Tdap vaccine. After that, you need a Td booster dose every 10 years.  Measles, mumps, rubella (MMR) immunization. / You need at least 1 dose of MMR if you were born in 1957 or later. You may also need a second dose.  Varicella immunization.** / Consult your caregiver.  Meningococcal immunization.** / Consult your caregiver.  Hepatitis A immunization.** / Consult your caregiver. 2 doses, 6 to 18 months apart.  Hepatitis B immunization.** / Consult your caregiver. 3 doses, usually over 6 months. Ages 65 and over  Blood pressure check.** / Every 1 to 2 years.  Lipid and cholesterol check.** / Every 5 years beginning at age 20.  Clinical breast exam.** / Every year after age 40.  Mammogram.** / Every year beginning at age 40 and continuing for as long as you are in good health. Consult with your caregiver.  Pap test.** / Every 3 years starting at age 30 through age 65 or 70 with a 3  consecutive normal Pap tests. Testing can be stopped between 65 and 70 with 3 consecutive normal Pap tests and no abnormal Pap or HPV tests in the past 10 years.  HPV screening.** / Every 3 years from ages 30 through ages 65 or 70 with a history of 3 consecutive normal Pap tests. Testing can be stopped between 65 and 70 with 3 consecutive normal Pap tests and no abnormal Pap or HPV tests in the past 10 years.  Fecal occult blood test (FOBT) of stool. / Every year beginning at age 50 and continuing until age 75. You may not need to do this test if you get a colonoscopy every 10 years.  Flexible sigmoidoscopy or colonoscopy.** / Every 5 years for a flexible sigmoidoscopy or every 10 years for a colonoscopy beginning at age 50 and continuing until age 75.  Hepatitis C blood test.** / For all people born from 1945 through 1965 and any individual with known risks for hepatitis C.  Osteoporosis screening.** / A one-time screening for women ages 65 and over and women at risk for fractures or osteoporosis.  Skin self-exam. / Monthly.  Influenza immunization.** / Every year.  Pneumococcal polysaccharide immunization.** / 1 dose at age 65 (or older) if you have never been vaccinated.  Tetanus, diphtheria, pertussis (Tdap, Td) immunization. / A one-time dose of Tdap vaccine if you are over   65 and have contact with an infant, are a healthcare worker, or simply want to be protected from whooping cough. After that, you need a Td booster dose every 10 years.  Varicella immunization.** / Consult your caregiver.  Meningococcal immunization.** / Consult your caregiver.  Hepatitis A immunization.** / Consult your caregiver. 2 doses, 6 to 18 months apart.  Hepatitis B immunization.** / Check with your caregiver. 3 doses, usually over 6 months. ** Family history and personal history of risk and conditions may change your caregiver's recommendations. Document Released: 11/26/2001 Document Revised: 12/23/2011  Document Reviewed: 02/25/2011 [employer] Patient Information 2014 [employer]  

## 2013-07-01 NOTE — Progress Notes (Signed)
Subjective:    Kimberly Edwards is a 72 y.o. female who presents for Medicare Annual/Subsequent preventive examination.  Preventive Screening-Counseling & Management  Tobacco History  Smoking status  . Former Smoker -- 0.25 packs/day for 50 years  . Types: Cigarettes  . Quit date: 03/23/2012  Smokeless tobacco  . Not on file    Comment: social and weekends alcohol     Problems Prior to Visit 1.   Current Problems (verified) Patient Active Problem List   Diagnosis Date Noted  . Obesity (BMI 30-39.9) 07/01/2013  . Dysuria 12/10/2012  . Nausea and vomiting 03/25/2012  . UTI (lower urinary tract infection) E Coli 03/25/2012  . Hypertension   . Arthritis   . Knee joint replacement status, left   . Right knee DJD   . HTN (hypertension) 12/09/2011  . Hyperlipidemia 12/09/2011  . Osteoarthritis 05/31/2011    Medications Prior to Visit Current Outpatient Prescriptions on File Prior to Visit  Medication Sig Dispense Refill  . acetaminophen (TYLENOL) 500 MG tablet Take 1,000 mg by mouth every 6 (six) hours as needed. Patient uses this medication for pain.      [name] Kitchen amoxicillin (AMOXIL) 500 MG capsule Take 4 capsules (2,000 mg total) by mouth once. takes prior to dental cleaning  30 capsule  0  . aspirin 81 MG tablet Take 81 mg by mouth daily.      . celecoxib (CELEBREX) 200 MG capsule Take 1 capsule (200 mg total) by mouth daily.  90 capsule  3  . citalopram (CELEXA) 10 MG tablet Take 1 tablet (10 mg total) by mouth daily.  90 tablet  0  . diltiazem (TAZTIA XT) 300 MG 24 hr capsule Take 1 capsule (300 mg total) by mouth daily.  90 capsule  3  . fish oil-omega-3 fatty acids 1000 MG capsule Take 3 g by mouth daily.      [name] Kitchen glucosamine-chondroitin 500-400 MG tablet Take 1 tablet by mouth daily.      [name] Kitchen lisinopril (PRINIVIL,ZESTRIL) 40 MG tablet Take 1 tablet (40 mg total) by mouth daily.  90 tablet  3  . Multiple Vitamins-Minerals (MULTIVITAMIN WITH MINERALS) tablet Take 1 tablet by  mouth daily.      . pravastatin (PRAVACHOL) 40 MG tablet TAKE 1 TABLET (40 MG TOTAL) BY MOUTH DAILY.  30 tablet  0  . triamterene-hydrochlorothiazide (DYAZIDE) 37.5-25 MG per capsule TAKE ONE CAPSULE BY MOUTH DAILY  30 capsule  0   No current facility-administered medications on file prior to visit.    Current Medications (verified) Current Outpatient Prescriptions  Medication Sig Dispense Refill  . acetaminophen (TYLENOL) 500 MG tablet Take 1,000 mg by mouth every 6 (six) hours as needed. Patient uses this medication for pain.      [name] Kitchen amoxicillin (AMOXIL) 500 MG capsule Take 4 capsules (2,000 mg total) by mouth once. takes prior to dental cleaning  30 capsule  0  . aspirin 81 MG tablet Take 81 mg by mouth daily.      . celecoxib (CELEBREX) 200 MG capsule Take 1 capsule (200 mg total) by mouth daily.  90 capsule  3  . citalopram (CELEXA) 10 MG tablet Take 1 tablet (10 mg total) by mouth daily.  90 tablet  0  . diltiazem (TAZTIA XT) 300 MG 24 hr capsule Take 1 capsule (300 mg total) by mouth daily.  90 capsule  3  . fish oil-omega-3 fatty acids 1000 MG capsule Take 3 g by mouth daily.      [name] Kitchen  glucosamine-chondroitin 500-400 MG tablet Take 1 tablet by mouth daily.      [name] Kitchen lisinopril (PRINIVIL,ZESTRIL) 40 MG tablet Take 1 tablet (40 mg total) by mouth daily.  90 tablet  3  . Multiple Vitamins-Minerals (MULTIVITAMIN WITH MINERALS) tablet Take 1 tablet by mouth daily.      . pravastatin (PRAVACHOL) 40 MG tablet TAKE 1 TABLET (40 MG TOTAL) BY MOUTH DAILY.  30 tablet  0  . triamterene-hydrochlorothiazide (DYAZIDE) 37.5-25 MG per capsule TAKE ONE CAPSULE BY MOUTH DAILY  30 capsule  0   No current facility-administered medications for this visit.     Allergies (verified) Oxycodone   PAST HISTORY  Family History Family History  Problem Relation Age of Onset  . Breast cancer Sister   . Hypertension Father     Social History History  Substance Use Topics  . Smoking status: Former Smoker --  0.25 packs/day for 50 years    Types: Cigarettes    Quit date: 03/23/2012  . Smokeless tobacco: Not on file     Comment: social and weekends alcohol  . Alcohol Use: 0.0 oz/week     Comment: <1     Are there smokers in your home (other than you)? No  Risk Factors Current exercise habits: Home exercise routine includes walking dog.  Dietary issues discussed: na   Cardiac risk factors: advanced age (older than 58 for men, 68 for women), dyslipidemia and obesity (BMI >= 30 kg/m2).  Depression Screen (Note: if answer to either of the following is "Yes", a more complete depression screening is indicated)   Over the past two weeks, have you felt down, depressed or hopeless? No  Over the past two weeks, have you felt little interest or pleasure in doing things? No  Have you lost interest or pleasure in daily life? No  Do you often feel hopeless? No  Do you cry easily over simple problems? No  Activities of Daily Living In your present state of health, do you have any difficulty performing the following activities?:  Driving? No Managing money?  No Feeding yourself? No Getting from bed to chair? No{exam,climbing a flight of stairs? No Preparing food and eating?: No Bathing or showering? No Getting dressed: No Getting to the toilet? No Using the toilet:No Moving around from place to place: No In the past year have you fallen or had a near fall?:No   Are you sexually active?  No  Do you have more than one partner?  No  Hearing Difficulties: No Do you often ask people to speak up or repeat themselves? No Do you experience ringing or noises in your ears? No Do you have difficulty understanding soft or whispered voices? No   Do you feel that you have a problem with memory? No  Do you often misplace items? No  Do you feel safe at home?  No  Cognitive Testing  Alert? Yes  Normal Appearance?Yes  Oriented to person? Yes  Place? Yes   Time? Yes  Recall of three objects?  Yes  Can  perform simple calculations? Yes  Displays appropriate judgment?Yes  Can read the correct time from a watch face?Yes   Advanced Directives have been discussed with the patient? Yes  List the Names of Other Physician/Practitioners you currently use: 1.  Ortho-[facility] 2 oph--[facility] 3. D[facility]  Indicate any recent Medical Services you may have received from other than [facility] providers in the past year (date may be approximate).   There is no immunization history on  file for this patient.  Screening Tests Health Maintenance  Topic Date Due  . Tetanus/tdap  05/04/1958  . Influenza Vaccine  05/14/2013  . Mammogram  01/06/2014  . Colonoscopy  04/14/2017  . Pneumococcal Polysaccharide Vaccine Age 77 And Over  Completed  . Zostavax  Completed    All answers were reviewed with the patient and necessary referrals were made:  Loreen Freud, DO   07/01/2013   History reviewed:  She  has a past medical history of Hypertension; Hyperlipidemia; Arthritis; Right knee DJD; Knee joint replacement status, left (2003); PONV (postoperative nausea and vomiting); Heart murmur; and Depression. She  does not have any pertinent problems on file. She  has past surgical history that includes Cholecystectomy (1998); Replacement total knee; Breast biopsy (2000); Tonsillectomy; Total knee arthroplasty (03/23/2012); and Joint replacement. Her family history includes Breast cancer in her sister; Hypertension in her father. She  reports that she quit smoking about 15 months ago. Her smoking use included Cigarettes. She has a 12.5 pack-year smoking history. She does not have any smokeless tobacco history on file. She reports that  drinks alcohol. She reports that she does not use illicit drugs. She has a current medication list which includes the following prescription(s): acetaminophen, amoxicillin, aspirin, celecoxib, citalopram, diltiazem, fish oil-omega-3 fatty acids, glucosamine-chondroitin, lisinopril,  multivitamin with minerals, pravastatin, and triamterene-hydrochlorothiazide. Current Outpatient Prescriptions on File Prior to Visit  Medication Sig Dispense Refill  . acetaminophen (TYLENOL) 500 MG tablet Take 1,000 mg by mouth every 6 (six) hours as needed. Patient uses this medication for pain.      [name] Kitchen amoxicillin (AMOXIL) 500 MG capsule Take 4 capsules (2,000 mg total) by mouth once. takes prior to dental cleaning  30 capsule  0  . aspirin 81 MG tablet Take 81 mg by mouth daily.      . celecoxib (CELEBREX) 200 MG capsule Take 1 capsule (200 mg total) by mouth daily.  90 capsule  3  . citalopram (CELEXA) 10 MG tablet Take 1 tablet (10 mg total) by mouth daily.  90 tablet  0  . diltiazem (TAZTIA XT) 300 MG 24 hr capsule Take 1 capsule (300 mg total) by mouth daily.  90 capsule  3  . fish oil-omega-3 fatty acids 1000 MG capsule Take 3 g by mouth daily.      [name] Kitchen glucosamine-chondroitin 500-400 MG tablet Take 1 tablet by mouth daily.      [name] Kitchen lisinopril (PRINIVIL,ZESTRIL) 40 MG tablet Take 1 tablet (40 mg total) by mouth daily.  90 tablet  3  . Multiple Vitamins-Minerals (MULTIVITAMIN WITH MINERALS) tablet Take 1 tablet by mouth daily.      . pravastatin (PRAVACHOL) 40 MG tablet TAKE 1 TABLET (40 MG TOTAL) BY MOUTH DAILY.  30 tablet  0  . triamterene-hydrochlorothiazide (DYAZIDE) 37.5-25 MG per capsule TAKE ONE CAPSULE BY MOUTH DAILY  30 capsule  0   No current facility-administered medications on file prior to visit.   She is allergic to oxycodone.  Review of Systems  Review of Systems  Constitutional: Negative for activity change, appetite change and fatigue.  HENT: Negative for hearing loss, congestion, tinnitus and ear discharge.   Eyes: Negative for visual disturbance (see optho q1y -- vision corrected to 20/20 with glasses).  Respiratory: Negative for cough, chest tightness and shortness of breath.   Cardiovascular: Negative for chest pain, palpitations and leg swelling.   Gastrointestinal: Negative for abdominal pain, diarrhea, constipation and abdominal distention.  Genitourinary: Negative for urgency, frequency, decreased urine  volume and difficulty urinating.  Musculoskeletal: Negative for back pain, arthralgias and gait problem.  Skin: Negative for color change, pallor and rash.  Neurological: Negative for dizziness, light-headedness, numbness and headaches.  Hematological: Negative for adenopathy. Does not bruise/bleed easily.  Psychiatric/Behavioral: Negative for suicidal ideas, confusion, sleep disturbance, self-injury, dysphoric mood, decreased concentration and agitation.  Pt is able to read and write and can do all ADLs No risk for falling No abuse/ violence in home      Objective:     Vision by Snellen chart: opth  Body mass index is 35.73 kg/(m^2). BP 128/80  Pulse 67  Temp(Src) 97.7 F (36.5 C) (Oral)  Resp 12  Ht 4\' 11"  (1.499 m)  Wt 177 lb (80.287 kg)  BMI 35.73 kg/m2  SpO2 96%  BP 128/80  Pulse 67  Temp(Src) 97.7 F (36.5 C) (Oral)  Resp 12  Ht 4\' 11"  (1.499 m)  Wt 177 lb (80.287 kg)  BMI 35.73 kg/m2  SpO2 96% General appearance: alert, cooperative, appears stated age and no distress Head: Normocephalic, without obvious abnormality, atraumatic Eyes: conjunctivae/corneas clear. PERRL, EOM's intact. Fundi benign. Ears: normal TM's and external ear canals both ears Nose: Nares normal. Septum midline. Mucosa normal. No drainage or sinus tenderness. Throat: lips, mucosa, and tongue normal; teeth and gums normal Neck: no adenopathy, no carotid bruit, no JVD, supple, symmetrical, trachea midline and thyroid not enlarged, symmetric, no tenderness/mass/nodules Back: symmetric, no curvature. ROM normal. No CVA tenderness. Lungs: clear to auscultation bilaterally Breasts: normal appearance, no masses or tenderness Heart: regular rate and rhythm, S1, S2 normal, no murmur, click, rub or gallop Abdomen: soft, non-tender; bowel  sounds normal; no masses,  no organomegaly Pelvic: not indicated; post-menopausal, no abnormal Pap smears in past Extremities: extremities normal, atraumatic, no cyanosis or edema Pulses: 2+ and symmetric Skin: Skin color, texture, turgor normal. No rashes or lesions Lymph nodes: Cervical, supraclavicular, and axillary nodes normal. Neurologic: Alert and oriented X 3, normal strength and tone. Normal symmetric reflexes. Normal coordination and gait Psych--no anxiety, no depression      Assessment:     cpe      Plan:     During the course of the visit the patient was educated and counseled about appropriate screening and preventive services including:    Pneumococcal vaccine   Influenza vaccine  Td vaccine  Screening mammography  Bone densitometry screening  Colorectal cancer screening  Glaucoma screening  Nutrition counseling   Smoking cessation counseling  Advanced directives: has an advanced directive - a copy HAS NOT been provided.  Diet review for nutrition referral? Yes ____  Not Indicated ____   Patient Instructions (the written plan) was given to the patient.  Medicare Attestation I have personally reviewed: The patient's medical and social history Their use of alcohol, tobacco or illicit drugs Their current medications and supplements The patient's functional ability including ADLs,fall risks, home safety risks, cognitive, and hearing and visual impairment Diet and physical activities Evidence for depression or mood disorders  The patient's weight, height, BMI, and visual acuity have been recorded in the chart.  I have made referrals, counseling, and provided education to the patient based on review of the above and I have provided the patient with a written personalized care plan for preventive services.     Loreen Freud, DO   07/01/2013

## 2013-07-01 NOTE — Assessment & Plan Note (Signed)
Stable con't meds 

## 2013-07-02 LAB — POCT URINALYSIS DIPSTICK
Bilirubin, UA: NEGATIVE
Blood, UA: NEGATIVE
Glucose, UA: NEGATIVE
Spec Grav, UA: 1.01

## 2013-07-17 ENCOUNTER — Other Ambulatory Visit: Payer: Self-pay | Admitting: Family Medicine

## 2013-11-06 ENCOUNTER — Other Ambulatory Visit: Payer: Self-pay | Admitting: Family Medicine

## 2013-12-06 ENCOUNTER — Other Ambulatory Visit: Payer: Self-pay | Admitting: Family Medicine

## 2013-12-06 NOTE — Telephone Encounter (Signed)
Last seen 07/01/13 and filled 03/15/13 #30. Uses prior to dental work. Please advise     KP

## 2013-12-13 ENCOUNTER — Other Ambulatory Visit: Payer: Self-pay

## 2013-12-13 DIAGNOSIS — Z1231 Encounter for screening mammogram for malignant neoplasm of breast: Secondary | ICD-10-CM

## 2014-01-06 ENCOUNTER — Other Ambulatory Visit: Payer: Self-pay | Admitting: Family Medicine

## 2014-01-07 ENCOUNTER — Ambulatory Visit
Admission: RE | Admit: 2014-01-07 | Discharge: 2014-01-07 | Disposition: A | Discharge status: Home / Self Care | Payer: Medicare Other | Source: Ambulatory Visit

## 2014-01-07 DIAGNOSIS — Z1231 Encounter for screening mammogram for malignant neoplasm of breast: Secondary | ICD-10-CM

## 2014-02-15 ENCOUNTER — Other Ambulatory Visit: Payer: Self-pay | Admitting: Family Medicine

## 2014-02-24 ENCOUNTER — Other Ambulatory Visit: Payer: Self-pay | Admitting: Family Medicine

## 2014-02-28 ENCOUNTER — Other Ambulatory Visit: Payer: Self-pay

## 2014-02-28 DIAGNOSIS — I10 Essential (primary) hypertension: Secondary | ICD-10-CM

## 2014-02-28 MED ORDER — LISINOPRIL 40 MG PO TABS: 40.0000 mg | ORAL_TABLET | Freq: Every day | ORAL | Status: DC

## 2014-02-28 MED ORDER — DILTIAZEM HCL ER BEADS 300 MG PO CP24: 300.0000 mg | ORAL_CAPSULE | Freq: Every day | ORAL | Status: DC

## 2014-03-16 ENCOUNTER — Other Ambulatory Visit: Payer: Self-pay | Admitting: Family Medicine

## 2014-03-22 ENCOUNTER — Telehealth: Payer: Self-pay | Admitting: *Deleted

## 2014-03-22 MED ORDER — PRAVASTATIN SODIUM 40 MG PO TABS: ORAL_TABLET | ORAL | Status: DC

## 2014-03-22 NOTE — Telephone Encounter (Signed)
Caller name: Lanya Relation to pt: self Call back number: [phone] Pharmacy:  Reason for call:   Pt called stating she needed a follow up for her medication and per her prescription of pravastatin (PRAVACHOL) 40 MG tablet, she has labs due.  There are no labs ordered in her chart.    bw

## 2014-03-22 NOTE — Telephone Encounter (Signed)
Her medication follow up is scheduled for 03/30/2014 at 9am.  She will be fasting.  bw

## 2014-03-27 ENCOUNTER — Emergency Department (HOSPITAL_BASED_OUTPATIENT_CLINIC_OR_DEPARTMENT_OTHER): Payer: Medicare Other

## 2014-03-27 ENCOUNTER — Encounter (HOSPITAL_BASED_OUTPATIENT_CLINIC_OR_DEPARTMENT_OTHER): Payer: Self-pay | Admitting: Emergency Medicine

## 2014-03-27 ENCOUNTER — Emergency Department (HOSPITAL_BASED_OUTPATIENT_CLINIC_OR_DEPARTMENT_OTHER)
Admission: EM | Admit: 2014-03-27 | Discharge: 2014-03-27 | Disposition: A | Discharge status: Home / Self Care | Payer: Medicare Other | Attending: Emergency Medicine | Admitting: Emergency Medicine

## 2014-03-27 DIAGNOSIS — S99919A Unspecified injury of unspecified ankle, initial encounter: Secondary | ICD-10-CM

## 2014-03-27 DIAGNOSIS — S20219A Contusion of unspecified front wall of thorax, initial encounter: Secondary | ICD-10-CM | POA: Insufficient documentation

## 2014-03-27 DIAGNOSIS — Z7982 Long term (current) use of aspirin: Secondary | ICD-10-CM | POA: Insufficient documentation

## 2014-03-27 DIAGNOSIS — Y9341 Activity, dancing: Secondary | ICD-10-CM | POA: Insufficient documentation

## 2014-03-27 DIAGNOSIS — Z792 Long term (current) use of antibiotics: Secondary | ICD-10-CM | POA: Insufficient documentation

## 2014-03-27 DIAGNOSIS — Y9289 Other specified places as the place of occurrence of the external cause: Secondary | ICD-10-CM | POA: Insufficient documentation

## 2014-03-27 DIAGNOSIS — S20212A Contusion of left front wall of thorax, initial encounter: Secondary | ICD-10-CM

## 2014-03-27 DIAGNOSIS — Z96659 Presence of unspecified artificial knee joint: Secondary | ICD-10-CM | POA: Insufficient documentation

## 2014-03-27 DIAGNOSIS — F329 Major depressive disorder, single episode, unspecified: Secondary | ICD-10-CM | POA: Insufficient documentation

## 2014-03-27 DIAGNOSIS — R011 Cardiac murmur, unspecified: Secondary | ICD-10-CM | POA: Insufficient documentation

## 2014-03-27 DIAGNOSIS — F3289 Other specified depressive episodes: Secondary | ICD-10-CM | POA: Insufficient documentation

## 2014-03-27 DIAGNOSIS — Z87891 Personal history of nicotine dependence: Secondary | ICD-10-CM | POA: Insufficient documentation

## 2014-03-27 DIAGNOSIS — IMO0002 Reserved for concepts with insufficient information to code with codable children: Secondary | ICD-10-CM | POA: Insufficient documentation

## 2014-03-27 DIAGNOSIS — Z79899 Other long term (current) drug therapy: Secondary | ICD-10-CM | POA: Insufficient documentation

## 2014-03-27 DIAGNOSIS — E785 Hyperlipidemia, unspecified: Secondary | ICD-10-CM | POA: Insufficient documentation

## 2014-03-27 DIAGNOSIS — I10 Essential (primary) hypertension: Secondary | ICD-10-CM | POA: Insufficient documentation

## 2014-03-27 DIAGNOSIS — Z791 Long term (current) use of non-steroidal anti-inflammatories (NSAID): Secondary | ICD-10-CM | POA: Insufficient documentation

## 2014-03-27 DIAGNOSIS — S99929A Unspecified injury of unspecified foot, initial encounter: Secondary | ICD-10-CM

## 2014-03-27 DIAGNOSIS — S8990XA Unspecified injury of unspecified lower leg, initial encounter: Secondary | ICD-10-CM | POA: Insufficient documentation

## 2014-03-27 DIAGNOSIS — W010XXA Fall on same level from slipping, tripping and stumbling without subsequent striking against object, initial encounter: Secondary | ICD-10-CM | POA: Insufficient documentation

## 2014-03-27 DIAGNOSIS — M1710 Unilateral primary osteoarthritis, unspecified knee: Secondary | ICD-10-CM | POA: Insufficient documentation

## 2014-03-27 MED ORDER — HYDROCODONE-ACETAMINOPHEN 5-325 MG PO TABS: 1.0000 | ORAL_TABLET | ORAL | Status: DC | PRN

## 2014-03-27 MED ORDER — HYDROCODONE-ACETAMINOPHEN 5-325 MG PO TABS
2.0000 | ORAL_TABLET | Freq: Once | ORAL | Status: AC
  Administered 2014-03-27: 2 via ORAL
  Filled 2014-03-27: qty 2

## 2014-03-27 NOTE — Discharge Instructions (Signed)
Blunt Chest Trauma  Blunt chest trauma is an injury caused by a blow to the chest. These chest injuries can be very painful. Blunt chest trauma often results in bruised or broken (fractured) ribs. Most cases of bruised and fractured ribs from blunt chest traumas get better after 1 to 3 weeks of rest and pain medicine. Often, the soft tissue in the chest wall is also injured, causing pain and bruising. Internal organs, such as the heart and lungs, may also be injured. Blunt chest trauma can lead to serious medical problems. This injury requires immediate medical care.  CAUSES   · Motor vehicle collisions.  · Falls.  · Physical violence.  · Sports injuries.  SYMPTOMS   · Chest pain. The pain may be worse when you move or breathe deeply.  · Shortness of breath.  · Lightheadedness.  · Bruising.  · Tenderness.  · Swelling.  DIAGNOSIS   Your caregiver will do a physical exam. X-rays may be taken to look for fractures. However, minor rib fractures may not show up on X-rays until a few days after the injury. If a more serious injury is suspected, further imaging tests may be done. This may include ultrasounds, computed tomography (CT) scans, or magnetic resonance imaging (MRI).  TREATMENT   Treatment depends on the severity of your injury. Your caregiver may prescribe pain medicines and deep breathing exercises.  HOME CARE INSTRUCTIONS  · Limit your activities until you can move around without much pain.  · Do not do any strenuous work until your injury is healed.  · Put ice on the injured area.  · Put ice in a plastic bag.  · Place a towel between your skin and the bag.  · Leave the ice on for 15-20 minutes, 03-04 times a day.  · You may wear a rib belt as directed by your caregiver to reduce pain.  · Practice deep breathing as directed by your caregiver to keep your lungs clear.  · Only take over-the-counter or prescription medicines for pain, fever, or discomfort as directed by your caregiver.  SEEK IMMEDIATE MEDICAL  CARE IF:   · You have increasing pain or shortness of breath.  · You cough up blood.  · You have nausea, vomiting, or abdominal pain.  · You have a fever.  · You feel dizzy, weak, or you faint.  MAKE SURE YOU:  · Understand these instructions.  · Will watch your condition.  · Will get help right away if you are not doing well or get worse.  Document Released: 11/07/2004 Document Revised: 12/23/2011 Document Reviewed: 07/17/2011  [employer]® Patient Information ©2014 [employer].

## 2014-03-27 NOTE — ED Provider Notes (Signed)
CSN: [id number]     Arrival date & time 03/27/14  1100 History   First MD Initiated Contact with Patient 03/27/14 1206     Chief Complaint  Patient presents with  . Fall     (Consider location/radiation/quality/duration/timing/severity/associated sxs/prior Treatment) HPI 73 year old female presents after a fall last night. She states she was on the dance floor at a wedding and she tripped on the uneven dance floor. She fell onto her knees and landed on her chest. Her knees have been mildly aching her since then but she woke up with left-sided chest pain this morning. He's point tender on the lower part of her chest. The pain is worse with inspiration. She's had a very mild cough but no shortness of breath. She rates the pain as about an 8/10. She took ibuprofen with some relief. She's been able to ambulate on her knees and has no significant pain there, but the pain is mostly in her chest.  Past Medical History  Diagnosis Date  . Hypertension   . Hyperlipidemia   . Arthritis   . Right knee DJD   . Knee joint replacement status, left 2003  . PONV (postoperative nausea and vomiting)   . Heart murmur   . Depression    Past Surgical History  Procedure Laterality Date  . Cholecystectomy  1998  . Replacement total knee      left knee  . Breast biopsy  2000    marker put in  . Tonsillectomy    . Total knee arthroplasty  03/23/2012    Procedure: TOTAL KNEE ARTHROPLASTY;  Surgeon: Lorn Junes, MD;  Location: [facility];  Service: Orthopedics;  Laterality: Right;  right total knee arthroplasty  . Joint replacement     Family History  Problem Relation Age of Onset  . Breast cancer Sister   . Hypertension Father    History  Substance Use Topics  . Smoking status: Former Smoker -- 0.25 packs/day for 50 years    Types: Cigarettes    Quit date: 03/23/2012  . Smokeless tobacco: Not on file     Comment: social and weekends alcohol  . Alcohol Use: 0.0 oz/week     Comment: <1   OB  History   Grav Para Term Preterm Abortions TAB SAB Ect Mult Living                 Review of Systems  Respiratory: Negative for shortness of breath.   Cardiovascular: Positive for chest pain.  Gastrointestinal: Negative for vomiting and abdominal pain.  Musculoskeletal: Negative for back pain and joint swelling.  Skin: Positive for color change (bruising).  All other systems reviewed and are negative.     Allergies  Oxycodone  Home Medications   Prior to Admission medications   Medication Sig Start Date End Date Taking? Authorizing Provider  acetaminophen (TYLENOL) 500 MG tablet Take 1,000 mg by mouth every 6 (six) hours as needed. Patient uses this medication for pain.    Historical Provider, MD  amoxicillin (AMOXIL) 500 MG capsule TAKE 4 CAPSULES BY MOUTH PRIOR TO DENTAL CLEANING 12/06/13   Rosalita Chessman, DO  aspirin 81 MG tablet Take 81 mg by mouth daily.    Historical Provider, MD  celecoxib (CELEBREX) 200 MG capsule Take 1 capsule (200 mg total) by mouth daily. 12/09/12   Rosalita Chessman, DO  citalopram (CELEXA) 10 MG tablet TAKE 1 TABLET BY MOUTH EVERY DAY 02/24/14   Rosalita Chessman, DO  diltiazem (TAZTIA  XT) 300 MG 24 hr capsule Take 1 capsule (300 mg total) by mouth daily. Office visit due now 02/28/14   Rosalita Chessman, DO  fish oil-omega-3 fatty acids 1000 MG capsule Take 3 g by mouth daily.    Historical Provider, MD  glucosamine-chondroitin 500-400 MG tablet Take 1 tablet by mouth daily.    Historical Provider, MD  lisinopril (PRINIVIL,ZESTRIL) 40 MG tablet Take 1 tablet (40 mg total) by mouth daily. 02/28/14   Rosalita Chessman, DO  Multiple Vitamins-Minerals (MULTIVITAMIN WITH MINERALS) tablet Take 1 tablet by mouth daily.    Historical Provider, MD  pravastatin (PRAVACHOL) 40 MG tablet TAKE 1 TABLET BY MOUTH EVERY DAY 03/22/14   Rosalita Chessman, DO  triamterene-hydrochlorothiazide (DYAZIDE) 37.5-25 MG per capsule TAKE ONE CAPSULE BY MOUTH DAILY**DUE FOR FOLLOW UP** 03/16/14   Yvonne  R Lowne, DO   BP 198/94  Pulse 55  Temp(Src) 97.9 F (36.6 C) (Oral)  Resp 20  Ht 4\' 11"  (1.499 m)  Wt 176 lb (79.833 kg)  BMI 35.53 kg/m2  SpO2 96% Physical Exam  Nursing note and vitals reviewed. Constitutional: She is oriented to person, place, and time. She appears well-developed and well-nourished. No distress.  HENT:  Head: Normocephalic and atraumatic.  Right Ear: External ear normal.  Left Ear: External ear normal.  Nose: Nose normal.  Eyes: Right eye exhibits no discharge. Left eye exhibits no discharge.  Cardiovascular: Normal rate, regular rhythm and normal heart sounds.   Pulmonary/Chest: Effort normal and breath sounds normal. She exhibits tenderness.    Abdominal: Soft. She exhibits no distension. There is no tenderness.  Musculoskeletal:       Right knee: She exhibits ecchymosis (mild). She exhibits normal range of motion. No tenderness found.       Left knee: She exhibits ecchymosis. She exhibits normal range of motion. No tenderness found.  Neurological: She is alert and oriented to person, place, and time.  Skin: Skin is warm and dry.    ED Course  Procedures (including critical care time) Labs Review Labs Reviewed - No data to display  Imaging Review Dg Chest 2 View  03/27/2014   CLINICAL DATA:  Fall  EXAM: CHEST  2 VIEW  COMPARISON:  03/17/2012  FINDINGS: The heart size and mediastinal contours are within normal limits. Both lungs are clear. The visualized skeletal structures are unremarkable.  IMPRESSION: No active cardiopulmonary disease.   Electronically Signed   By: Kerby Moors M.D.   On: 03/27/2014 11:52     EKG Interpretation None      MDM   Final diagnoses:  Contusion of rib on left side    Patient appears to have a lower left rib contusion/subtle fracture. At this time she appears well and is not in significant pain. She was given hydrocodone here (has nausea with oxycodone but no problems with hydrocodone) and had marked pain  relief. She's given an Administrator. Feel this time she is stable for discharge. She has minimal tenderness in her knees and has been able to ambulate. Do not feel she needs any x-rays at this time.    Ephraim Hamburger, MD 03/27/14 [phone]

## 2014-03-27 NOTE — ED Notes (Signed)
MD at bedside. 

## 2014-03-27 NOTE — ED Notes (Signed)
Pt reports fall yesterday. Sts that she fell forward landing on her knees. Reports pain to left ribs at this time. Limited movement without pain.

## 2014-03-30 ENCOUNTER — Telehealth: Payer: Self-pay | Admitting: Family Medicine

## 2014-03-30 ENCOUNTER — Encounter: Payer: Self-pay | Admitting: Family Medicine

## 2014-03-30 ENCOUNTER — Ambulatory Visit (INDEPENDENT_AMBULATORY_CARE_PROVIDER_SITE_OTHER): Payer: Medicare Other | Admitting: Family Medicine

## 2014-03-30 VITALS — BP 122/84 | HR 64 | Temp 98.3°F | Wt 175.0 lb

## 2014-03-30 DIAGNOSIS — F32A Depression, unspecified: Secondary | ICD-10-CM

## 2014-03-30 DIAGNOSIS — R0781 Pleurodynia: Secondary | ICD-10-CM

## 2014-03-30 DIAGNOSIS — I10 Essential (primary) hypertension: Secondary | ICD-10-CM

## 2014-03-30 DIAGNOSIS — F3289 Other specified depressive episodes: Secondary | ICD-10-CM

## 2014-03-30 DIAGNOSIS — R0789 Other chest pain: Secondary | ICD-10-CM

## 2014-03-30 DIAGNOSIS — F329 Major depressive disorder, single episode, unspecified: Secondary | ICD-10-CM

## 2014-03-30 DIAGNOSIS — E785 Hyperlipidemia, unspecified: Secondary | ICD-10-CM

## 2014-03-30 DIAGNOSIS — R079 Chest pain, unspecified: Secondary | ICD-10-CM

## 2014-03-30 DIAGNOSIS — H9190 Unspecified hearing loss, unspecified ear: Secondary | ICD-10-CM

## 2014-03-30 LAB — LIPID PANEL
Cholesterol: 181 mg/dL (ref 0–200)
HDL: 44.3 mg/dL (ref >39.00)
LDL Cholesterol: 92 mg/dL (ref 0–99)
NONHDL: 136.7
Total CHOL/HDL Ratio: 4
Triglycerides: 222 mg/dL — ABNORMAL HIGH (ref 0.0–149.0)
VLDL: 44.4 mg/dL — ABNORMAL HIGH (ref 0.0–40.0)

## 2014-03-30 LAB — HEPATIC FUNCTION PANEL
ALBUMIN: 3.7 g/dL (ref 3.5–5.2)
ALK PHOS: 65 U/L (ref 39–117)
ALT: 16 U/L (ref 0–35)
AST: 22 U/L (ref 0–37)
Bilirubin, Direct: 0 mg/dL (ref 0.0–0.3)
Total Bilirubin: 0.2 mg/dL (ref 0.2–1.2)
Total Protein: 7 g/dL (ref 6.0–8.3)

## 2014-03-30 LAB — BASIC METABOLIC PANEL
BUN: 36 mg/dL — ABNORMAL HIGH (ref 6–23)
CALCIUM: 9 mg/dL (ref 8.4–10.5)
CO2: 26 mEq/L (ref 19–32)
Chloride: 106 mEq/L (ref 96–112)
Creatinine, Ser: 0.9 mg/dL (ref 0.4–1.2)
GFR: 67.48 mL/min (ref >60.00)
GLUCOSE: 98 mg/dL (ref 70–99)
POTASSIUM: 4.5 meq/L (ref 3.5–5.1)
SODIUM: 139 meq/L (ref 135–145)

## 2014-03-30 MED ORDER — LISINOPRIL 40 MG PO TABS: 40.0000 mg | ORAL_TABLET | Freq: Every day | ORAL | Status: DC

## 2014-03-30 MED ORDER — DILTIAZEM HCL ER BEADS 300 MG PO CP24: 300.0000 mg | ORAL_CAPSULE | Freq: Every day | ORAL | Status: DC

## 2014-03-30 MED ORDER — HYDROCODONE-ACETAMINOPHEN 5-325 MG PO TABS: 1.0000 | ORAL_TABLET | ORAL | Status: DC | PRN

## 2014-03-30 MED ORDER — TRIAMTERENE-HCTZ 37.5-25 MG PO CAPS: ORAL_CAPSULE | ORAL | Status: DC

## 2014-03-30 MED ORDER — CITALOPRAM HYDROBROMIDE 10 MG PO TABS: ORAL_TABLET | ORAL | Status: DC

## 2014-03-30 NOTE — Telephone Encounter (Signed)
Please advise      KP 

## 2014-03-30 NOTE — Telephone Encounter (Signed)
Patient has been made aware that the referral has been placed.      KP

## 2014-03-30 NOTE — Patient Instructions (Signed)
Rib Contusion °A rib contusion (bruise) can occur by a blow to the chest or by a fall against a hard object. Usually these will be much better in a couple weeks. If X-rays were taken today and there are no broken bones (fractures), the diagnosis of bruising is made. However, broken ribs may not show up for several days, or may be discovered later on a routine X-ray when signs of healing show up. If this happens to you, it does not mean that something was missed on the X-ray, but simply that it did not show up on the first X-rays. Earlier diagnosis will not usually change the treatment. °HOME CARE INSTRUCTIONS  °· Avoid strenuous activity. Be careful during activities and avoid bumping the injured ribs. Activities that pull on the injured ribs and cause pain should be avoided, if possible. °· For the first day or two, an ice pack used every 20 minutes while awake may be helpful. Put ice in a plastic bag and put a towel between the bag and the skin. °· Eat a normal, well-balanced diet. Drink plenty of fluids to avoid constipation. °· Take deep breaths several times a day to keep lungs free of infection. Try to cough several times a day. Splint the injured area with a pillow while coughing to ease pain. Coughing can help prevent pneumonia. °· Wear a rib belt or binder only if told to do so by your caregiver. If you are wearing a rib belt or binder, you must do the breathing exercises as directed by your caregiver. If not used properly, rib belts or binders restrict breathing which can lead to pneumonia. °· Only take over-the-counter or prescription medicines for pain, discomfort, or fever as directed by your caregiver. °SEEK MEDICAL CARE IF:  °· You or your child has an oral temperature above 102° F (38.9° C). °· Your baby is older than 3 months with a rectal temperature of 100.5° F (38.1° C) or higher for more than 1 day. °· You develop a cough, with thick or bloody sputum. °SEEK IMMEDIATE MEDICAL CARE IF:  °· You  have difficulty breathing. °· You feel sick to your stomach (nausea), have vomiting or belly (abdominal) pain. °· You have worsening pain, not controlled with medications, or there is a change in the location of the pain. °· You develop sweating or radiation of the pain into the arms, jaw or shoulders, or become light headed or faint. °· You or your child has an oral temperature above 102° F (38.9° C), not controlled by medicine. °· Your or your baby is older than 3 months with a rectal temperature of 102° F (38.9° C) or higher. °· Your baby is 3 months old or younger with a rectal temperature of 100.4° F (38° C) or higher. °MAKE SURE YOU:  °· Understand these instructions. °· Will watch your condition. °· Will get help right away if you are not doing well or get worse. °Document Released: 06/25/2001 Document Revised: 01/25/2013 Document Reviewed: 05/18/2008 °ExitCare® Patient Information ©2015 [employer]. This information is not intended to replace advice given to you by your health care provider. Make sure you discuss any questions you have with your health care provider. ° °

## 2014-03-30 NOTE — Telephone Encounter (Signed)
Refer to hearing center

## 2014-03-30 NOTE — Progress Notes (Signed)
Subjective:    Patient here for follow-up of elevated blood pressure.  She is exercising and is adherent to a low-salt diet.  Blood pressure is well controlled at home. Cardiac symptoms: none. Patient denies: chest pain, chest pressure/discomfort, claudication, dyspnea, exertional chest pressure/discomfort, fatigue, irregular heart beat, lower extremity edema, near-syncope, orthopnea, palpitations, paroxysmal nocturnal dyspnea, syncope and tachypnea. Cardiovascular risk factors: advanced age (older than 71 for men, 45 for women), dyslipidemia, hypertension and obesity (BMI >= 30 kg/m2). Use of agents associated with hypertension: none. History of target organ damage: none.  Pt is also here to have cholesterol checked.  The family later called asking for [place] to send her for a hearing test  The following portions of the patient's history were reviewed and updated as appropriate:  She  has a past medical history of Hypertension; Hyperlipidemia; Arthritis; Right knee DJD; Knee joint replacement status, left (2003); PONV (postoperative nausea and vomiting); Heart murmur; and Depression. She  does not have any pertinent problems on file. She  has past surgical history that includes Cholecystectomy (1998); Replacement total knee; Breast biopsy (2000); Tonsillectomy; Total knee arthroplasty (03/23/2012); and Joint replacement. Her family history includes Breast cancer in her sister; Hypertension in her father. She  reports that she quit smoking about 2 years ago. Her smoking use included Cigarettes. She has a 12.5 pack-year smoking history. She does not have any smokeless tobacco history on file. She reports that she drinks alcohol. She reports that she does not use illicit drugs. She has a current medication list which includes the following prescription(s): amoxicillin, aspirin, celecoxib, citalopram, diltiazem, fish oil-omega-3 fatty acids, glucosamine-chondroitin, hydrocodone-acetaminophen, lisinopril,  meloxicam, multivitamin with minerals, pravastatin, and triamterene-hydrochlorothiazide. Current Outpatient Prescriptions on File Prior to Visit  Medication Sig Dispense Refill  . amoxicillin (AMOXIL) 500 MG capsule TAKE 4 CAPSULES BY MOUTH PRIOR TO DENTAL CLEANING  30 capsule  0  . aspirin 81 MG tablet Take 81 mg by mouth daily.      . celecoxib (CELEBREX) 200 MG capsule Take 1 capsule (200 mg total) by mouth daily.  90 capsule  3  . citalopram (CELEXA) 10 MG tablet TAKE 1 TABLET BY MOUTH EVERY DAY  90 tablet  0  . diltiazem (TAZTIA XT) 300 MG 24 hr capsule Take 1 capsule (300 mg total) by mouth daily. Office visit due now  90 capsule  0  . fish oil-omega-3 fatty acids 1000 MG capsule Take 3 g by mouth daily.      [name] Kitchen glucosamine-chondroitin 500-400 MG tablet Take 1 tablet by mouth daily.      [name] Kitchen lisinopril (PRINIVIL,ZESTRIL) 40 MG tablet Take 1 tablet (40 mg total) by mouth daily.  90 tablet  0  . Multiple Vitamins-Minerals (MULTIVITAMIN WITH MINERALS) tablet Take 1 tablet by mouth daily.      . pravastatin (PRAVACHOL) 40 MG tablet TAKE 1 TABLET BY MOUTH EVERY DAY  30 tablet  0  . triamterene-hydrochlorothiazide (DYAZIDE) 37.5-25 MG per capsule TAKE ONE CAPSULE BY MOUTH DAILY**DUE FOR FOLLOW UP**  15 capsule  0   No current facility-administered medications on file prior to visit.   She is allergic to oxycodone..  Review of Systems Pertinent items are noted in HPI.     Objective:    BP 122/84  Pulse 64  Temp(Src) 98.3 F (36.8 C) (Oral)  Wt 175 lb (79.379 kg)  SpO2 96% General appearance: alert, cooperative, appears stated age and no distress Throat: lips, mucosa, and tongue normal; teeth and gums normal Neck:  no adenopathy, no carotid bruit, no JVD, supple, symmetrical, trachea midline and thyroid not enlarged, symmetric, no tenderness/mass/nodules Lungs: clear to auscultation bilaterally Abdomen: soft, non-tender; bowel sounds normal; no masses,  no organomegaly Extremities:  extremities normal, atraumatic, no cyanosis or edema    Assessment:    Hypertension, normal blood pressure . Evidence of target organ damage: none.    Plan:    Medication: no change. Dietary sodium restriction. Regular aerobic exercise. Check blood pressures 2-3 times weekly and record. Follow up: 6 months and as needed. -- or sooner prn  1. Rib pain on left side Ice, rest - HYDROcodone-acetaminophen (NORCO) 5-325 MG per tablet; Take 1-2 tablets by mouth every 4 (four) hours as needed for moderate pain.  Dispense: 30 tablet; Refill: 0  2. HTN (hypertension) stable - diltiazem (TAZTIA XT) 300 MG 24 hr capsule; Take 1 capsule (300 mg total) by mouth daily.  Dispense: 90 capsule; Refill: 3 - lisinopril (PRINIVIL,ZESTRIL) 40 MG tablet; Take 1 tablet (40 mg total) by mouth daily.  Dispense: 90 tablet; Refill: 3 - triamterene-hydrochlorothiazide (DYAZIDE) 37.5-25 MG per capsule; TAKE ONE CAPSULE BY MOUTH DAILy  Dispense: 90 capsule; Refill: 3 - Basic metabolic panel - POCT urinalysis dipstick  3. Depression stable - citalopram (CELEXA) 10 MG tablet; TAKE 1 TABLET BY MOUTH EVERY DAY  Dispense: 90 tablet; Refill: 3  4. Other and unspecified hyperlipidemia con't meds - Hepatic function panel - Lipid panel - POCT urinalysis dipstick  5. Decreased hearing   - Comprehensive hearing test; Future

## 2014-03-30 NOTE — Telephone Encounter (Signed)
Caller name: Melesa  Call back [phone]   Reason for call:  Pt states that he kids want her to get her ears checked.  Pt want to know if there is any audiologist that Dr. Etter Sjogren recommends.

## 2014-03-30 NOTE — Progress Notes (Signed)
Pre visit review using our clinic review tool, if applicable. No additional management support is needed unless otherwise documented below in the visit note. 

## 2014-03-31 ENCOUNTER — Telehealth: Payer: Self-pay | Admitting: Family Medicine

## 2014-03-31 ENCOUNTER — Other Ambulatory Visit: Payer: Self-pay

## 2014-03-31 LAB — POCT URINALYSIS DIPSTICK
BILIRUBIN UA: NEGATIVE
Blood, UA: NEGATIVE
GLUCOSE UA: NEGATIVE
Ketones, UA: NEGATIVE
Leukocytes, UA: NEGATIVE
Nitrite, UA: NEGATIVE
Protein, UA: NEGATIVE
Spec Grav, UA: 1.015
Urobilinogen, UA: 0.2
pH, UA: 5

## 2014-03-31 MED ORDER — PRAVASTATIN SODIUM 40 MG PO TABS
ORAL_TABLET | ORAL | Status: DC
Start: 2014-03-31 — End: 2014-09-27

## 2014-03-31 NOTE — Telephone Encounter (Signed)
Relevant patient education assigned to patient using Emmi. ° °

## 2014-04-19 ENCOUNTER — Other Ambulatory Visit: Payer: Self-pay | Admitting: Family Medicine

## 2014-05-25 ENCOUNTER — Other Ambulatory Visit: Payer: Self-pay | Admitting: Family Medicine

## 2014-05-28 ENCOUNTER — Other Ambulatory Visit: Payer: Self-pay | Admitting: Family Medicine

## 2014-09-27 ENCOUNTER — Ambulatory Visit (INDEPENDENT_AMBULATORY_CARE_PROVIDER_SITE_OTHER): Payer: Medicare Other | Admitting: Family Medicine

## 2014-09-27 ENCOUNTER — Encounter: Payer: Self-pay | Admitting: Family Medicine

## 2014-09-27 VITALS — BP 114/70 | HR 67 | Temp 99.0°F | Wt 179.2 lb

## 2014-09-27 DIAGNOSIS — E785 Hyperlipidemia, unspecified: Secondary | ICD-10-CM

## 2014-09-27 DIAGNOSIS — I10 Essential (primary) hypertension: Secondary | ICD-10-CM

## 2014-09-27 DIAGNOSIS — Z23 Encounter for immunization: Secondary | ICD-10-CM

## 2014-09-27 DIAGNOSIS — R5383 Other fatigue: Secondary | ICD-10-CM

## 2014-09-27 LAB — POCT URINALYSIS DIPSTICK
BILIRUBIN UA: NEGATIVE
GLUCOSE UA: NEGATIVE
Ketones, UA: NEGATIVE
LEUKOCYTES UA: NEGATIVE
NITRITE UA: NEGATIVE
Protein, UA: NEGATIVE
RBC UA: NEGATIVE
Spec Grav, UA: 1.015
UROBILINOGEN UA: 0.2
pH, UA: 6.5

## 2014-09-27 LAB — HEPATIC FUNCTION PANEL
ALT: 15 U/L (ref 0–35)
AST: 22 U/L (ref 0–37)
Albumin: 3.8 g/dL (ref 3.5–5.2)
Alkaline Phosphatase: 62 U/L (ref 39–117)
BILIRUBIN DIRECT: 0 mg/dL (ref 0.0–0.3)
TOTAL PROTEIN: 7.5 g/dL (ref 6.0–8.3)
Total Bilirubin: 0.7 mg/dL (ref 0.2–1.2)

## 2014-09-27 LAB — CBC WITH DIFFERENTIAL/PLATELET
BASOS PCT: 0.4 % (ref 0.0–3.0)
Basophils Absolute: 0 10*3/uL (ref 0.0–0.1)
EOS PCT: 2 % (ref 0.0–5.0)
Eosinophils Absolute: 0.2 10*3/uL (ref 0.0–0.7)
HCT: 43.5 % (ref 36.0–46.0)
Hemoglobin: 14.4 g/dL (ref 12.0–15.0)
Lymphocytes Relative: 27.5 % (ref 12.0–46.0)
Lymphs Abs: 2.6 10*3/uL (ref 0.7–4.0)
MCHC: 33 g/dL (ref 30.0–36.0)
MCV: 89.2 fl (ref 78.0–100.0)
MONO ABS: 0.5 10*3/uL (ref 0.1–1.0)
MONOS PCT: 5.8 % (ref 3.0–12.0)
NEUTROS PCT: 64.3 % (ref 43.0–77.0)
Neutro Abs: 6 10*3/uL (ref 1.4–7.7)
Platelets: 265 10*3/uL (ref 150.0–400.0)
RBC: 4.88 Mil/uL (ref 3.87–5.11)
RDW: 13.7 % (ref 11.5–15.5)
WBC: 9.3 10*3/uL (ref 4.0–10.5)

## 2014-09-27 LAB — LIPID PANEL
Cholesterol: 191 mg/dL (ref 0–200)
HDL: 42.5 mg/dL (ref >39.00)
NONHDL: 148.5
TRIGLYCERIDES: 253 mg/dL — AB (ref 0.0–149.0)
Total CHOL/HDL Ratio: 4
VLDL: 50.6 mg/dL — ABNORMAL HIGH (ref 0.0–40.0)

## 2014-09-27 LAB — BASIC METABOLIC PANEL
BUN: 26 mg/dL — ABNORMAL HIGH (ref 6–23)
CHLORIDE: 106 meq/L (ref 96–112)
CO2: 26 mEq/L (ref 19–32)
Calcium: 9.6 mg/dL (ref 8.4–10.5)
Creatinine, Ser: 0.9 mg/dL (ref 0.4–1.2)
GFR: 67.39 mL/min (ref >60.00)
Glucose, Bld: 94 mg/dL (ref 70–99)
POTASSIUM: 4.4 meq/L (ref 3.5–5.1)
Sodium: 138 mEq/L (ref 135–145)

## 2014-09-27 LAB — T3, FREE: T3 FREE: 3.3 pg/mL (ref 2.3–4.2)

## 2014-09-27 LAB — TSH: TSH: 1.88 u[IU]/mL (ref 0.35–4.50)

## 2014-09-27 LAB — T4, FREE: FREE T4: 0.93 ng/dL (ref 0.60–1.60)

## 2014-09-27 MED ORDER — PRAVASTATIN SODIUM 40 MG PO TABS: 40.0000 mg | ORAL_TABLET | Freq: Every day | ORAL | Status: DC

## 2014-09-27 MED ORDER — AMOXICILLIN 500 MG PO CAPS: ORAL_CAPSULE | ORAL | Status: DC

## 2014-09-27 NOTE — Progress Notes (Signed)
Subjective:    Patient here for follow-up of elevated blood pressure.  She is exercising and is adherent to a low-salt diet.  Blood pressure is well controlled at home. Cardiac symptoms: none. Patient denies: chest pain, chest pressure/discomfort, claudication, dyspnea, exertional chest pressure/discomfort, fatigue, irregular heart beat, lower extremity edema, near-syncope, orthopnea, palpitations, paroxysmal nocturnal dyspnea, syncope and tachypnea. Cardiovascular risk factors: advanced age (older than 75 for men, 63 for women), dyslipidemia, hypertension and obesity (BMI >= 30 kg/m2). Use of agents associated with hypertension: none. History of target organ damage: none.  The following portions of the patient's history were reviewed and updated as appropriate:  She  has a past medical history of Hypertension; Hyperlipidemia; Arthritis; Right knee DJD; Knee joint replacement status, left (2003); PONV (postoperative nausea and vomiting); Heart murmur; and Depression. She  does not have any pertinent problems on file. She  has past surgical history that includes Cholecystectomy (1998); Replacement total knee; Breast biopsy (2000); Tonsillectomy; Total knee arthroplasty (03/23/2012); and Joint replacement. Her family history includes Breast cancer in her sister; Hypertension in her father. She  reports that she quit smoking about 2 years ago. Her smoking use included Cigarettes. She has a 12.5 pack-year smoking history. She does not have any smokeless tobacco history on file. She reports that she drinks alcohol. She reports that she does not use illicit drugs. She has a current medication list which includes the following prescription(s): amoxicillin, aspirin, citalopram, diltiazem, diltiazem, fish oil-omega-3 fatty acids, glucosamine-chondroitin, lisinopril, meloxicam, multivitamin with minerals, pravastatin, and triamterene-hydrochlorothiazide. Current Outpatient Prescriptions on File Prior to Visit   Medication Sig Dispense Refill  . amoxicillin (AMOXIL) 500 MG capsule TAKE 4 CAPSULES BY MOUTH PRIOR TO DENTAL CLEANING 30 capsule 0  . aspirin 81 MG tablet Take 81 mg by mouth daily.    . citalopram (CELEXA) 10 MG tablet TAKE 1 TABLET BY MOUTH EVERY DAY 90 tablet 1  . diltiazem (CARDIZEM CD) 300 MG 24 hr capsule Take 1 capsule (300 mg total) by mouth daily. 90 capsule 1  . diltiazem (TAZTIA XT) 300 MG 24 hr capsule Take 1 capsule (300 mg total) by mouth daily. 90 capsule 3  . fish oil-omega-3 fatty acids 1000 MG capsule Take 3 g by mouth daily.    [name] Kitchen glucosamine-chondroitin 500-400 MG tablet Take 1 tablet by mouth daily.    [name] Kitchen lisinopril (PRINIVIL,ZESTRIL) 40 MG tablet TAKE 1 TABLET (40 MG TOTAL) BY MOUTH DAILY. 90 tablet 1  . meloxicam (MOBIC) 15 MG tablet Take 1 tablet by mouth daily.    . Multiple Vitamins-Minerals (MULTIVITAMIN WITH MINERALS) tablet Take 1 tablet by mouth daily.    . pravastatin (PRAVACHOL) 40 MG tablet TAKE 1 TABLET BY MOUTH EVERY DAY 30 tablet 5  . triamterene-hydrochlorothiazide (DYAZIDE) 37.5-25 MG per capsule TAKE ONE CAPSULE BY MOUTH DAILy 90 capsule 3   No current facility-administered medications on file prior to visit.   She is allergic to oxycodone..  Review of Systems Pertinent items are noted in HPI.     Objective:    BP 114/70 mmHg  Pulse 67  Temp(Src) 99 F (37.2 C) (Oral)  Wt 179 lb 3.2 oz (81.285 kg)  SpO2 93% General appearance: alert, cooperative, appears stated age and no distress Neck: no adenopathy, supple, symmetrical, trachea midline and thyroid not enlarged, symmetric, no tenderness/mass/nodules Lungs: clear to auscultation bilaterally Heart: S1, S2 normal Extremities: extremities normal, atraumatic, no cyanosis or edema    Assessment:    Hypertension, normal blood pressure . Evidence  of target organ damage: none.    Plan:    Medication: no change. Dietary sodium restriction. Follow up: 6 months and as needed.    1. Other  fatigue Check labs - Basic metabolic panel - CBC with Differential - POCT urinalysis dipstick - TSH - T3, free - T4, free  2. Essential hypertension Check labs, stable - Basic metabolic panel - CBC with Differential - POCT urinalysis dipstick  3. Hyperlipidemia con't meds , check labs - Hepatic function panel - Lipid panel - POCT urinalysis dipstick - pravastatin (PRAVACHOL) 40 MG tablet; Take 1 tablet (40 mg total) by mouth daily.  Dispense: 90 tablet; Refill: 1

## 2014-09-27 NOTE — Patient Instructions (Signed)

## 2014-09-27 NOTE — Progress Notes (Signed)
Pre visit review using our clinic review tool, if applicable. No additional management support is needed unless otherwise documented below in the visit note. 

## 2014-09-28 LAB — LDL CHOLESTEROL, DIRECT: Direct LDL: 108.5 mg/dL

## 2014-09-29 NOTE — Addendum Note (Signed)
Addended by: Reino Bellis on: 09/29/2014 10:14 AM   Modules accepted: Orders

## 2014-10-04 MED ORDER — FENOFIBRATE 160 MG PO TABS: 160.0000 mg | ORAL_TABLET | Freq: Every day | ORAL | Status: DC

## 2014-11-21 ENCOUNTER — Other Ambulatory Visit: Payer: Self-pay

## 2014-11-21 MED ORDER — CITALOPRAM HYDROBROMIDE 10 MG PO TABS: 10.0000 mg | ORAL_TABLET | Freq: Every day | ORAL | Status: DC

## 2014-11-25 ENCOUNTER — Other Ambulatory Visit: Payer: Self-pay | Admitting: Family Medicine

## 2014-11-28 ENCOUNTER — Other Ambulatory Visit: Payer: Self-pay

## 2014-11-28 MED ORDER — LISINOPRIL 40 MG PO TABS: ORAL_TABLET | ORAL | Status: DC

## 2014-12-15 ENCOUNTER — Other Ambulatory Visit: Payer: Self-pay

## 2014-12-15 DIAGNOSIS — Z1231 Encounter for screening mammogram for malignant neoplasm of breast: Secondary | ICD-10-CM

## 2014-12-31 ENCOUNTER — Other Ambulatory Visit: Payer: Self-pay | Admitting: Family Medicine

## 2014-12-31 DIAGNOSIS — E785 Hyperlipidemia, unspecified: Secondary | ICD-10-CM

## 2015-01-17 ENCOUNTER — Ambulatory Visit
Admission: RE | Admit: 2015-01-17 | Discharge: 2015-01-17 | Disposition: A | Discharge status: Home / Self Care | Payer: Medicare Other | Source: Ambulatory Visit

## 2015-01-17 DIAGNOSIS — Z1231 Encounter for screening mammogram for malignant neoplasm of breast: Secondary | ICD-10-CM

## 2015-03-21 ENCOUNTER — Other Ambulatory Visit: Payer: Self-pay | Admitting: Family Medicine

## 2015-03-30 ENCOUNTER — Ambulatory Visit (INDEPENDENT_AMBULATORY_CARE_PROVIDER_SITE_OTHER): Payer: Medicare Other | Admitting: Family Medicine

## 2015-03-30 ENCOUNTER — Encounter: Payer: Self-pay | Admitting: Family Medicine

## 2015-03-30 VITALS — BP 118/78 | HR 69 | Temp 98.1°F | Wt 180.8 lb

## 2015-03-30 DIAGNOSIS — E785 Hyperlipidemia, unspecified: Secondary | ICD-10-CM

## 2015-03-30 DIAGNOSIS — F32A Depression, unspecified: Secondary | ICD-10-CM

## 2015-03-30 DIAGNOSIS — I10 Essential (primary) hypertension: Secondary | ICD-10-CM

## 2015-03-30 DIAGNOSIS — F329 Major depressive disorder, single episode, unspecified: Secondary | ICD-10-CM | POA: Diagnosis not present

## 2015-03-30 LAB — LIPID PANEL
CHOL/HDL RATIO: 4
CHOLESTEROL: 201 mg/dL — AB (ref 0–200)
HDL: 51 mg/dL (ref >39.00)
NonHDL: 150
TRIGLYCERIDES: 204 mg/dL — AB (ref 0.0–149.0)
VLDL: 40.8 mg/dL — ABNORMAL HIGH (ref 0.0–40.0)

## 2015-03-30 LAB — HEPATIC FUNCTION PANEL
ALBUMIN: 4.1 g/dL (ref 3.5–5.2)
ALT: 17 U/L (ref 0–35)
AST: 26 U/L (ref 0–37)
Alkaline Phosphatase: 50 U/L (ref 39–117)
Bilirubin, Direct: 0.2 mg/dL (ref 0.0–0.3)
Total Bilirubin: 0.6 mg/dL (ref 0.2–1.2)
Total Protein: 7.3 g/dL (ref 6.0–8.3)

## 2015-03-30 LAB — LDL CHOLESTEROL, DIRECT: Direct LDL: 121 mg/dL

## 2015-03-30 MED ORDER — TRIAMTERENE-HCTZ 37.5-25 MG PO CAPS: 1.0000 | ORAL_CAPSULE | Freq: Every day | ORAL | Status: DC

## 2015-03-30 MED ORDER — LISINOPRIL 40 MG PO TABS: ORAL_TABLET | ORAL | Status: DC

## 2015-03-30 MED ORDER — CITALOPRAM HYDROBROMIDE 10 MG PO TABS: 10.0000 mg | ORAL_TABLET | Freq: Every day | ORAL | Status: DC

## 2015-03-30 MED ORDER — DILTIAZEM HCL ER BEADS 300 MG PO CP24: 300.0000 mg | ORAL_CAPSULE | Freq: Every day | ORAL | Status: DC

## 2015-03-30 MED ORDER — AMOXICILLIN 500 MG PO CAPS: ORAL_CAPSULE | ORAL | Status: DC

## 2015-03-30 MED ORDER — PRAVASTATIN SODIUM 40 MG PO TABS: 40.0000 mg | ORAL_TABLET | Freq: Every day | ORAL | Status: DC

## 2015-03-30 NOTE — Assessment & Plan Note (Signed)
Check labs con't pravastatin Pt was not taking fenofibrate due to cost

## 2015-03-30 NOTE — Patient Instructions (Signed)

## 2015-03-30 NOTE — Progress Notes (Signed)
Patient ID: Kimberly Edwards, female    DOB: 06-25-1941  Age: 74 y.o. MRN: [id number]    Subjective:  Subjective HPI Kimberly Edwards presents for f/u bp and cholesterol.    Review of Systems  Constitutional: Negative for activity change, appetite change, fatigue and unexpected weight change.  Respiratory: Negative for cough and shortness of breath.   Cardiovascular: Negative for chest pain and palpitations.  Psychiatric/Behavioral: Negative for behavioral problems and dysphoric mood. The patient is not nervous/anxious.     History Past Medical History  Diagnosis Date  . Hypertension   . Hyperlipidemia   . Arthritis   . Right knee DJD   . Knee joint replacement status, left 2003  . PONV (postoperative nausea and vomiting)   . Heart murmur   . Depression     She has past surgical history that includes Cholecystectomy (1998); Replacement total knee; Breast biopsy (2000); Tonsillectomy; Total knee arthroplasty (03/23/2012); and Joint replacement.   Her family history includes Breast cancer in her sister; Hypertension in her father.She reports that she quit smoking about 3 years ago. Her smoking use included Cigarettes. She has a 12.5 pack-year smoking history. She does not have any smokeless tobacco history on file. She reports that she drinks alcohol. She reports that she does not use illicit drugs.  Current Outpatient Prescriptions on File Prior to Visit  Medication Sig Dispense Refill  . aspirin 81 MG tablet Take 81 mg by mouth daily.    . fish oil-omega-3 fatty acids 1000 MG capsule Take 3 g by mouth daily.    [name] Kitchen glucosamine-chondroitin 500-400 MG tablet Take 1 tablet by mouth daily.    . meloxicam (MOBIC) 15 MG tablet Take 1 tablet by mouth daily.    . Multiple Vitamins-Minerals (MULTIVITAMIN WITH MINERALS) tablet Take 1 tablet by mouth daily.     No current facility-administered medications on file prior to visit.     Objective:  Objective Physical Exam  Constitutional: She  is oriented to person, place, and time. She appears well-developed and well-nourished.  HENT:  Head: Normocephalic and atraumatic.  Eyes: Conjunctivae and EOM are normal.  Neck: Normal range of motion. Neck supple. No JVD present. Carotid bruit is not present. No thyromegaly present.  Cardiovascular: Normal rate, regular rhythm and normal heart sounds.   No murmur heard. Pulmonary/Chest: Effort normal and breath sounds normal. No respiratory distress. She has no wheezes. She has no rales. She exhibits no tenderness.  Musculoskeletal: She exhibits no edema.  Neurological: She is alert and oriented to person, place, and time.  Psychiatric: She has a normal mood and affect.   BP 118/78 mmHg  Pulse 69  Temp(Src) 98.1 F (36.7 C) (Oral)  Wt 180 lb 12.8 oz (82.01 kg)  SpO2 96% Wt Readings from Last 3 Encounters:  03/30/15 180 lb 12.8 oz (82.01 kg)  09/27/14 179 lb 3.2 oz (81.285 kg)  03/30/14 175 lb (79.379 kg)     Lab Results  Component Value Date   WBC 9.3 09/27/2014   HGB 14.4 09/27/2014   HCT 43.5 09/27/2014   PLT 265.0 09/27/2014   GLUCOSE 94 09/27/2014   CHOL 191 09/27/2014   TRIG 253.0* 09/27/2014   HDL 42.50 09/27/2014   LDLDIRECT 108.5 09/27/2014   LDLCALC 92 03/30/2014   ALT 15 09/27/2014   AST 22 09/27/2014   NA 138 09/27/2014   K 4.4 09/27/2014   CL 106 09/27/2014   CREATININE 0.9 09/27/2014   BUN 26* 09/27/2014   CO2 26 09/27/2014  TSH 1.88 09/27/2014   INR 0.98 03/17/2012   HGBA1C 5.6 06/08/2012    Mm Screening Breast Tomo Bilateral  01/17/2015   CLINICAL DATA:  Screening.  EXAM: DIGITAL SCREENING BILATERAL MAMMOGRAM WITH 3D TOMO WITH CAD  COMPARISON:  Previous exam(s).  ACR Breast Density Category b: There are scattered areas of fibroglandular density.  FINDINGS: There are no findings suspicious for malignancy. Images were processed with CAD.  IMPRESSION: No mammographic evidence of malignancy. A result letter of this screening mammogram will be mailed  directly to the patient.  RECOMMENDATION: Screening mammogram in one year. (Code:SM-B-01Y)  BI-RADS CATEGORY  1: Negative.   Electronically Signed   By: Conchita Paris M.D.   On: 01/17/2015 13:41     Assessment & Plan:  Plan I have discontinued Ms. Marullo's fenofibrate. I have also changed her triamterene-hydrochlorothiazide. Additionally, I am having her maintain her multivitamin with minerals, aspirin, fish oil-omega-3 fatty acids, glucosamine-chondroitin, meloxicam, pravastatin, lisinopril, diltiazem, citalopram, and amoxicillin.  Meds ordered this encounter  Medications  . triamterene-hydrochlorothiazide (DYAZIDE) 37.5-25 MG per capsule    Sig: Take 1 each (1 capsule total) by mouth daily.    Dispense:  90 capsule    Refill:  1    D/C PREVIOUS SCRIPTS FOR THIS MEDICATION  . pravastatin (PRAVACHOL) 40 MG tablet    Sig: Take 1 tablet (40 mg total) by mouth daily.    Dispense:  90 tablet    Refill:  1    D/C PREVIOUS SCRIPTS FOR THIS MEDICATION  . lisinopril (PRINIVIL,ZESTRIL) 40 MG tablet    Sig: TAKE 1 TABLET (40 MG TOTAL) BY MOUTH DAILY.    Dispense:  90 tablet    Refill:  3    D/C PREVIOUS SCRIPTS FOR THIS MEDICATION  . diltiazem (TAZTIA XT) 300 MG 24 hr capsule    Sig: Take 1 capsule (300 mg total) by mouth daily.    Dispense:  90 capsule    Refill:  3    D/C PREVIOUS SCRIPTS FOR THIS MEDICATION  . citalopram (CELEXA) 10 MG tablet    Sig: Take 1 tablet (10 mg total) by mouth daily.    Dispense:  90 tablet    Refill:  1    D/C PREVIOUS SCRIPTS FOR THIS MEDICATION  . amoxicillin (AMOXIL) 500 MG capsule    Sig: TAKE 4 CAPSULES BY MOUTH PRIOR TO DENTAL CLEANING    Dispense:  30 capsule    Refill:  1    Problem List Items Addressed This Visit    Hypertension    Stable con't con't lisinopril      Relevant Medications   triamterene-hydrochlorothiazide (DYAZIDE) 37.5-25 MG per capsule   pravastatin (PRAVACHOL) 40 MG tablet   lisinopril (PRINIVIL,ZESTRIL) 40 MG tablet     diltiazem (TAZTIA XT) 300 MG 24 hr capsule   Hyperlipidemia - Primary    Check labs con't pravastatin Pt was not taking fenofibrate due to cost      Relevant Medications   triamterene-hydrochlorothiazide (DYAZIDE) 37.5-25 MG per capsule   pravastatin (PRAVACHOL) 40 MG tablet   lisinopril (PRINIVIL,ZESTRIL) 40 MG tablet   diltiazem (TAZTIA XT) 300 MG 24 hr capsule    Other Visit Diagnoses    Depression        Relevant Medications    citalopram (CELEXA) 10 MG tablet       Follow-up: Return in about 6 months (around 09/29/2015), or if symptoms worsen or fail to improve, for annual exam, fasting.  Garnet Koyanagi, DO

## 2015-03-30 NOTE — Progress Notes (Signed)
Pre visit review using our clinic review tool, if applicable. No additional management support is needed unless otherwise documented below in the visit note. 

## 2015-03-30 NOTE — Assessment & Plan Note (Signed)
Stable con't con't lisinopril

## 2015-04-09 ENCOUNTER — Other Ambulatory Visit: Payer: Self-pay | Admitting: Family Medicine

## 2015-09-28 ENCOUNTER — Other Ambulatory Visit: Payer: Self-pay | Admitting: Family Medicine

## 2015-10-03 ENCOUNTER — Other Ambulatory Visit: Payer: Self-pay | Admitting: Family Medicine

## 2015-10-18 ENCOUNTER — Telehealth: Payer: Self-pay

## 2015-10-18 NOTE — Telephone Encounter (Signed)
Pre Visit Call Completed. 

## 2015-10-19 ENCOUNTER — Encounter: Payer: Self-pay | Admitting: Family Medicine

## 2015-10-19 ENCOUNTER — Ambulatory Visit (INDEPENDENT_AMBULATORY_CARE_PROVIDER_SITE_OTHER): Payer: PPO | Admitting: Family Medicine

## 2015-10-19 VITALS — BP 124/80 | HR 70 | Temp 98.0°F | Ht 59.0 in | Wt 154.8 lb

## 2015-10-19 DIAGNOSIS — I10 Essential (primary) hypertension: Secondary | ICD-10-CM | POA: Diagnosis not present

## 2015-10-19 DIAGNOSIS — Z0000 Encounter for general adult medical examination without abnormal findings: Secondary | ICD-10-CM

## 2015-10-19 DIAGNOSIS — Z23 Encounter for immunization: Secondary | ICD-10-CM | POA: Diagnosis not present

## 2015-10-19 DIAGNOSIS — Z96653 Presence of artificial knee joint, bilateral: Secondary | ICD-10-CM

## 2015-10-19 DIAGNOSIS — E785 Hyperlipidemia, unspecified: Secondary | ICD-10-CM

## 2015-10-19 LAB — CBC WITH DIFFERENTIAL/PLATELET
BASOS ABS: 0 10*3/uL (ref 0.0–0.1)
Basophils Relative: 0.5 % (ref 0.0–3.0)
EOS ABS: 0.2 10*3/uL (ref 0.0–0.7)
Eosinophils Relative: 1.8 % (ref 0.0–5.0)
HCT: 44.7 % (ref 36.0–46.0)
Hemoglobin: 14.4 g/dL (ref 12.0–15.0)
LYMPHS ABS: 2.1 10*3/uL (ref 0.7–4.0)
LYMPHS PCT: 24.1 % (ref 12.0–46.0)
MCHC: 32.2 g/dL (ref 30.0–36.0)
MCV: 89.5 fl (ref 78.0–100.0)
Monocytes Absolute: 0.5 10*3/uL (ref 0.1–1.0)
Monocytes Relative: 5.1 % (ref 3.0–12.0)
NEUTROS ABS: 6 10*3/uL (ref 1.4–7.7)
NEUTROS PCT: 68.5 % (ref 43.0–77.0)
PLATELETS: 288 10*3/uL (ref 150.0–400.0)
RBC: 4.99 Mil/uL (ref 3.87–5.11)
RDW: 14 % (ref 11.5–15.5)
WBC: 8.8 10*3/uL (ref 4.0–10.5)

## 2015-10-19 LAB — LIPID PANEL
CHOL/HDL RATIO: 3
Cholesterol: 158 mg/dL (ref 0–200)
HDL: 45.6 mg/dL (ref >39.00)
LDL CALC: 91 mg/dL (ref 0–99)
NONHDL: 112.33
TRIGLYCERIDES: 106 mg/dL (ref 0.0–149.0)
VLDL: 21.2 mg/dL (ref 0.0–40.0)

## 2015-10-19 LAB — COMPREHENSIVE METABOLIC PANEL
ALT: 9 U/L (ref 0–35)
AST: 18 U/L (ref 0–37)
Albumin: 3.9 g/dL (ref 3.5–5.2)
Alkaline Phosphatase: 67 U/L (ref 39–117)
BUN: 27 mg/dL — ABNORMAL HIGH (ref 6–23)
CALCIUM: 9.9 mg/dL (ref 8.4–10.5)
CHLORIDE: 104 meq/L (ref 96–112)
CO2: 29 mEq/L (ref 19–32)
Creatinine, Ser: 0.76 mg/dL (ref 0.40–1.20)
GFR: 78.55 mL/min (ref >60.00)
GLUCOSE: 100 mg/dL — AB (ref 70–99)
POTASSIUM: 4.6 meq/L (ref 3.5–5.1)
Sodium: 138 mEq/L (ref 135–145)
Total Bilirubin: 0.6 mg/dL (ref 0.2–1.2)
Total Protein: 7.1 g/dL (ref 6.0–8.3)

## 2015-10-19 MED ORDER — AMOXICILLIN 500 MG PO CAPS: ORAL_CAPSULE | ORAL | Status: DC

## 2015-10-19 NOTE — Progress Notes (Signed)
Pre visit review using our clinic review tool, if applicable. No additional management support is needed unless otherwise documented below in the visit note. 

## 2015-10-19 NOTE — Progress Notes (Signed)
Subjective:   Kimberly Edwards is a 75 y.o. female who presents for Medicare Annual (Subsequent) preventive examination.  Review of Systems:   Review of Systems  Constitutional: Negative for activity change, appetite change and fatigue.  HENT: Negative for hearing loss, congestion, tinnitus and ear discharge.   Eyes: Negative for visual disturbance (see optho q1y -- vision corrected to 20/20 with glasses).  Respiratory: Negative for cough, chest tightness and shortness of breath.   Cardiovascular: Negative for chest pain, palpitations and leg swelling.  Gastrointestinal: Negative for abdominal pain, diarrhea, constipation and abdominal distention.  Genitourinary: Negative for urgency, frequency, decreased urine volume and difficulty urinating.  Musculoskeletal: Negative for back pain, arthralgias and gait problem.  Skin: Negative for color change, pallor and rash.  Neurological: Negative for dizziness, light-headedness, numbness and headaches.  Hematological: Negative for adenopathy. Does not bruise/bleed easily.  Psychiatric/Behavioral: Negative for suicidal ideas, confusion, sleep disturbance, self-injury, dysphoric mood, decreased concentration and agitation.  Pt is able to read and write and can do all ADLs No risk for falling No abuse/ violence in home           Objective:     Vitals: BP 124/80 mmHg  Pulse 70  Temp(Src) 98 F (36.7 C) (Oral)  Ht _0  (1.499 m)  Wt 154 lb 12.8 oz (70.217 kg)  BMI 31.25 kg/m2  SpO2 97% BP 124/80 mmHg  Pulse 70  Temp(Src) 98 F (36.7 C) (Oral)  Ht _1  (1.499 m)  Wt 154 lb 12.8 oz (70.217 kg)  BMI 31.25 kg/m2  SpO2 97% General appearance: alert, cooperative, appears stated age and no distress Head: Normocephalic, without obvious abnormality, atraumatic Eyes: conjunctivae/corneas clear. PERRL, EOM's intact. Fundi benign. Ears: normal TM's and external ear canals both ears Nose: Nares normal. Septum midline. Mucosa normal. No  drainage or sinus tenderness. Throat: lips, mucosa, and tongue normal; teeth and gums normal Neck: no adenopathy, supple, symmetrical, trachea midline and thyroid not enlarged, symmetric, no tenderness/mass/nodules Back: symmetric, no curvature. ROM normal. No CVA tenderness. Lungs: clear to auscultation bilaterally Breasts: normal appearance, no masses or tenderness Heart: S1, S2 normal Abdomen: soft, non-tender; bowel sounds normal; no masses,  no organomegaly Pelvic: not indicated; post-menopausal, no abnormal Pap smears in past Extremities: extremities normal, atraumatic, no cyanosis or edema Pulses: 2+ and symmetric Skin: Skin color, texture, turgor normal. No rashes or lesions Lymph nodes: Cervical, supraclavicular, and axillary nodes normal. Neurologic: Alert and oriented X 3, normal strength and tone. Normal symmetric reflexes. Normal coordination and gait  Psych-no depression, no anxiety Tobacco History  Smoking status  . Current Every Day Smoker -- 0.10 packs/day for 50 years  . Types: Cigarettes  . Last Attempt to Quit: 03/23/2012  Smokeless tobacco  . Not on file    Comment: social and weekends alcohol     Ready to quit: Not Answered Counseling given: Not Answered   Past Medical History  Diagnosis Date  . Hypertension   . Hyperlipidemia   . Arthritis   . Right knee DJD   . Knee joint replacement status, left 2003  . PONV (postoperative nausea and vomiting)   . Heart murmur   . Depression    Past Surgical History  Procedure Laterality Date  . Cholecystectomy  1998  . Replacement total knee      left knee  . Breast biopsy  2000    marker put in  . Tonsillectomy    . Total knee arthroplasty  03/23/2012  Procedure: TOTAL KNEE ARTHROPLASTY;  Surgeon: Lorn Junes, MD;  Location: [facility];  Service: Orthopedics;  Laterality: Right;  right total knee arthroplasty  . Joint replacement     Family History  Problem Relation Age of Onset  . Breast cancer Sister    . Hypertension Father    History  Sexual Activity  . Sexual Activity:  . Partners: Male  . Birth Control/ Protection: Post-menopausal    Outpatient Encounter Prescriptions as of 10/19/2015  Medication Sig  . aspirin 81 MG tablet Take 81 mg by mouth daily.  . citalopram (CELEXA) 10 MG tablet Take 1 tablet (10 mg total) by mouth daily. Office visit due now  . diltiazem (TAZTIA XT) 300 MG 24 hr capsule Take 1 capsule (300 mg total) by mouth daily.  . fish oil-omega-3 fatty acids 1000 MG capsule Take 3 g by mouth daily.  [name] Kitchen glucosamine-chondroitin 500-400 MG tablet Take 1 tablet by mouth daily.  [name] Kitchen lisinopril (PRINIVIL,ZESTRIL) 40 MG tablet TAKE 1 TABLET (40 MG TOTAL) BY MOUTH DAILY.  . meloxicam (MOBIC) 15 MG tablet Take 1 tablet by mouth daily.  . Multiple Vitamins-Minerals (MULTIVITAMIN WITH MINERALS) tablet Take 1 tablet by mouth daily.  . pravastatin (PRAVACHOL) 40 MG tablet Take 1 tablet (40 mg total) by mouth daily. Repeat labs are due now  . triamterene-hydrochlorothiazide (DYAZIDE) 37.5-25 MG per capsule Take 1 each (1 capsule total) by mouth daily.  . [DISCONTINUED] fenofibrate 160 MG tablet Take 1 tablet (160 mg total) by mouth daily. (Patient not taking: Reported on 10/19/2015)   No facility-administered encounter medications on file as of 10/19/2015.    Activities of Daily Living In your present state of health, do you have any difficulty performing the following activities: 03/30/2015  Hearing? N  Vision? N  Difficulty concentrating or making decisions? N  Walking or climbing stairs? Y  Dressing or bathing? N  Doing errands, shopping? N    Patient Care Team: Rosalita Chessman, DO as PCP - General (Family Medicine)    Assessment:    cpe: Exercise Activities and Dietary recommendations --  con't current exercise    Goals    None     Fall Risk Fall Risk  10/19/2015 03/30/2015 03/30/2014 03/30/2014  Falls in the past year? No No Yes Yes  Number falls in past yr: - - 1 -    Injury with Fall? - - Yes -  Risk Factor Category  - - High Fall Risk -   Depression Screen PHQ 2/9 Scores 10/19/2015 03/30/2015 03/30/2014 06/08/2012  PHQ - 2 Score 0 0 0 1     Cognitive Testing mmse 30/30  Immunization History  Administered Date(s) Administered  . Influenza, High Dose Seasonal PF 06/27/2015  . Influenza,inj,Quad PF,36+ Mos 07/01/2013  . Influenza-Unspecified 06/14/2014  . Pneumococcal Conjugate-13 09/29/2014   Screening Tests Health Maintenance  Topic Date Due  . TETANUS/TDAP  05/04/1958  . PNA vac Low Risk Adult (2 of 2 - PPSV23) 09/30/2015  . MAMMOGRAM  01/17/2016  . INFLUENZA VACCINE  05/14/2016  . DEXA SCAN  Completed  . ZOSTAVAX  Completed      Plan:    see avs During the course of the visit the patient was educated and counseled about the following appropriate screening and preventive services:   Vaccines to include Pneumoccal, Influenza, Hepatitis B, Td, Zostavax, HCV  Electrocardiogram  Cardiovascular Disease  Colorectal cancer screening  Bone density screening  Diabetes screening  Glaucoma screening  Mammography/PAP  Nutrition counseling  Patient Instructions (the written plan) was given to the patient.  1. Essential hypertension  Current outpatient prescriptions:  .  aspirin 81 MG tablet, Take 81 mg by mouth daily., Disp: , Rfl:  .  citalopram (CELEXA) 10 MG tablet, Take 1 tablet (10 mg total) by mouth daily. Office visit due now, Disp: 90 tablet, Rfl: 0 .  diltiazem (TAZTIA XT) 300 MG 24 hr capsule, Take 1 capsule (300 mg total) by mouth daily., Disp: 90 capsule, Rfl: 3 .  fish oil-omega-3 fatty acids 1000 MG capsule, Take 3 g by mouth daily., Disp: , Rfl:  .  glucosamine-chondroitin 500-400 MG tablet, Take 1 tablet by mouth daily., Disp: , Rfl:  .  lisinopril (PRINIVIL,ZESTRIL) 40 MG tablet, TAKE 1 TABLET (40 MG TOTAL) BY MOUTH DAILY., Disp: 90 tablet, Rfl: 3 .  meloxicam (MOBIC) 15 MG tablet, Take 1 tablet by mouth daily.,  Disp: , Rfl:  .  Multiple Vitamins-Minerals (MULTIVITAMIN WITH MINERALS) tablet, Take 1 tablet by mouth daily., Disp: , Rfl:  .  pravastatin (PRAVACHOL) 40 MG tablet, Take 1 tablet (40 mg total) by mouth daily. Repeat labs are due now, Disp: 90 tablet, Rfl: 0 .  triamterene-hydrochlorothiazide (DYAZIDE) 37.5-25 MG per capsule, Take 1 each (1 capsule total) by mouth daily., Disp: 90 capsule, Rfl: 1 .  amoxicillin (AMOXIL) 500 MG capsule, 4 po before dental procedures, Disp: 30 capsule, Rfl: 1   - Comp Met (CMET) - POCT urinalysis dipstick - Lipid panel - CBC with Differential/Platelet  2. Hyperlipemia  Current outpatient prescriptions:  .  aspirin 81 MG tablet, Take 81 mg by mouth daily., Disp: , Rfl:  .  citalopram (CELEXA) 10 MG tablet, Take 1 tablet (10 mg total) by mouth daily. Office visit due now, Disp: 90 tablet, Rfl: 0 .  diltiazem (TAZTIA XT) 300 MG 24 hr capsule, Take 1 capsule (300 mg total) by mouth daily., Disp: 90 capsule, Rfl: 3 .  fish oil-omega-3 fatty acids 1000 MG capsule, Take 3 g by mouth daily., Disp: , Rfl:  .  glucosamine-chondroitin 500-400 MG tablet, Take 1 tablet by mouth daily., Disp: , Rfl:  .  lisinopril (PRINIVIL,ZESTRIL) 40 MG tablet, TAKE 1 TABLET (40 MG TOTAL) BY MOUTH DAILY., Disp: 90 tablet, Rfl: 3 .  meloxicam (MOBIC) 15 MG tablet, Take 1 tablet by mouth daily., Disp: , Rfl:  .  Multiple Vitamins-Minerals (MULTIVITAMIN WITH MINERALS) tablet, Take 1 tablet by mouth daily., Disp: , Rfl:  .  pravastatin (PRAVACHOL) 40 MG tablet, Take 1 tablet (40 mg total) by mouth daily. Repeat labs are due now, Disp: 90 tablet, Rfl: 0 .  triamterene-hydrochlorothiazide (DYAZIDE) 37.5-25 MG per capsule, Take 1 each (1 capsule total) by mouth daily., Disp: 90 capsule, Rfl: 1 .  amoxicillin (AMOXIL) 500 MG capsule, 4 po before dental procedures, Disp: 30 capsule, Rfl: 1  - Comp Met (CMET) - POCT urinalysis dipstick - Lipid panel - CBC with Differential/Platelet  3.  Preventative health care    4. Status post total bilateral knee replacement   - amoxicillin (AMOXIL) 500 MG capsule; 4 po before dental procedures  Dispense: 30 capsule; Refill: 1  5. Routine history and physical examination of adult    6. Need for pneumococcal vaccination   - Pneumococcal polysaccharide vaccine 23-valent greater than or equal to 2yo subcutaneous/IM  Garnet Koyanagi, DO  10/19/2015

## 2015-10-19 NOTE — Patient Instructions (Signed)
Preventive Care for Adults, Female A healthy lifestyle and preventive care can promote health and wellness. Preventive health guidelines for women include the following key practices.  A routine yearly physical is a good way to check with your health care provider about your health and preventive screening. It is a chance to share any concerns and updates on your health and to receive a thorough exam.  Visit your dentist for a routine exam and preventive care every 6 months. Brush your teeth twice a day and floss once a day. Good oral hygiene prevents tooth decay and gum disease.  The frequency of eye exams is based on your age, health, family medical history, use of contact lenses, and other factors. Follow your health care provider's recommendations for frequency of eye exams.  Eat a healthy diet. Foods like vegetables, fruits, whole grains, low-fat dairy products, and lean protein foods contain the nutrients you need without too many calories. Decrease your intake of foods high in solid fats, added sugars, and salt. Eat the right amount of calories for you.Get information about a proper diet from your health care provider, if necessary.  Regular physical exercise is one of the most important things you can do for your health. Most adults should get at least 150 minutes of moderate-intensity exercise (any activity that increases your heart rate and causes you to sweat) each week. In addition, most adults need muscle-strengthening exercises on 2 or more days a week.  Maintain a healthy weight. The body mass index (BMI) is a screening tool to identify possible weight problems. It provides an estimate of body fat based on height and weight. Your health care provider can find your BMI and can help you achieve or maintain a healthy weight.For adults 20 years and older:  A BMI below 18.5 is considered underweight.  A BMI of 18.5 to 24.9 is normal.  A BMI of 25 to 29.9 is considered overweight.  A  BMI of 30 and above is considered obese.  Maintain normal blood lipids and cholesterol levels by exercising and minimizing your intake of saturated fat. Eat a balanced diet with plenty of fruit and vegetables. Blood tests for lipids and cholesterol should begin at age 45 and be repeated every 5 years. If your lipid or cholesterol levels are high, you are over 50, or you are at high risk for heart disease, you may need your cholesterol levels checked more frequently.Ongoing high lipid and cholesterol levels should be treated with medicines if diet and exercise are not working.  If you smoke, find out from your health care provider how to quit. If you do not use tobacco, do not start.  Lung cancer screening is recommended for adults aged 45-80 years who are at high risk for developing lung cancer because of a history of smoking. A yearly low-dose CT scan of the lungs is recommended for people who have at least a 30-pack-year history of smoking and are a current smoker or have quit within the past 15 years. A pack year of smoking is smoking an average of 1 pack of cigarettes a day for 1 year (for example: 1 pack a day for 30 years or 2 packs a day for 15 years). Yearly screening should continue until the smoker has stopped smoking for at least 15 years. Yearly screening should be stopped for people who develop a health problem that would prevent them from having lung cancer treatment.  If you are pregnant, do not drink alcohol. If you are  breastfeeding, be very cautious about drinking alcohol. If you are not pregnant and choose to drink alcohol, do not have more than 1 drink per day. One drink is considered to be 12 ounces (355 mL) of beer, 5 ounces (148 mL) of wine, or 1.5 ounces (44 mL) of liquor.  Avoid use of street drugs. Do not share needles with anyone. Ask for help if you need support or instructions about stopping the use of drugs.  High blood pressure causes heart disease and increases the risk  of stroke. Your blood pressure should be checked at least every 1 to 2 years. Ongoing high blood pressure should be treated with medicines if weight loss and exercise do not work.  If you are 55-79 years old, ask your health care provider if you should take aspirin to prevent strokes.  Diabetes screening is done by taking a blood sample to check your blood glucose level after you have not eaten for a certain period of time (fasting). If you are not overweight and you do not have risk factors for diabetes, you should be screened once every 3 years starting at age 45. If you are overweight or obese and you are 40-70 years of age, you should be screened for diabetes every year as part of your cardiovascular risk assessment.  Breast cancer screening is essential preventive care for women. You should practice "breast self-awareness." This means understanding the normal appearance and feel of your breasts and may include breast self-examination. Any changes detected, no matter how small, should be reported to a health care provider. Women in their 20s and 30s should have a clinical breast exam (CBE) by a health care provider as part of a regular health exam every 1 to 3 years. After age 40, women should have a CBE every year. Starting at age 40, women should consider having a mammogram (breast X-ray test) every year. Women who have a family history of breast cancer should talk to their health care provider about genetic screening. Women at a high risk of breast cancer should talk to their health care providers about having an MRI and a mammogram every year.  Breast cancer gene (BRCA)-related cancer risk assessment is recommended for women who have family members with BRCA-related cancers. BRCA-related cancers include breast, ovarian, tubal, and peritoneal cancers. Having family members with these cancers may be associated with an increased risk for harmful changes (mutations) in the breast cancer genes BRCA1 and  BRCA2. Results of the assessment will determine the need for genetic counseling and BRCA1 and BRCA2 testing.  Your health care provider may recommend that you be screened regularly for cancer of the pelvic organs (ovaries, uterus, and vagina). This screening involves a pelvic examination, including checking for microscopic changes to the surface of your cervix (Pap test). You may be encouraged to have this screening done every 3 years, beginning at age 21.  For women ages 30-65, health care providers may recommend pelvic exams and Pap testing every 3 years, or they may recommend the Pap and pelvic exam, combined with testing for human papilloma virus (HPV), every 5 years. Some types of HPV increase your risk of cervical cancer. Testing for HPV may also be done on women of any age with unclear Pap test results.  Other health care providers may not recommend any screening for nonpregnant women who are considered low risk for pelvic cancer and who do not have symptoms. Ask your health care provider if a screening pelvic exam is right for   you.  If you have had past treatment for cervical cancer or a condition that could lead to cancer, you need Pap tests and screening for cancer for at least 20 years after your treatment. If Pap tests have been discontinued, your risk factors (such as having a new sexual partner) need to be reassessed to determine if screening should resume. Some women have medical problems that increase the chance of getting cervical cancer. In these cases, your health care provider may recommend more frequent screening and Pap tests.  Colorectal cancer can be detected and often prevented. Most routine colorectal cancer screening begins at the age of 50 years and continues through age 75 years. However, your health care provider may recommend screening at an earlier age if you have risk factors for colon cancer. On a yearly basis, your health care provider may provide home test kits to check  for hidden blood in the stool. Use of a small camera at the end of a tube, to directly examine the colon (sigmoidoscopy or colonoscopy), can detect the earliest forms of colorectal cancer. Talk to your health care provider about this at age 50, when routine screening begins. Direct exam of the colon should be repeated every 5-10 years through age 75 years, unless early forms of precancerous polyps or small growths are found.  People who are at an increased risk for hepatitis B should be screened for this virus. You are considered at high risk for hepatitis B if:  You were born in a country where hepatitis B occurs often. Talk with your health care provider about which countries are considered high risk.  Your parents were born in a high-risk country and you have not received a shot to protect against hepatitis B (hepatitis B vaccine).  You have HIV or AIDS.  You use needles to inject street drugs.  You live with, or have sex with, someone who has hepatitis B.  You get hemodialysis treatment.  You take certain medicines for conditions like cancer, organ transplantation, and autoimmune conditions.  Hepatitis C blood testing is recommended for all people born from 1945 through 1965 and any individual with known risks for hepatitis C.  Practice safe sex. Use condoms and avoid high-risk sexual practices to reduce the spread of sexually transmitted infections (STIs). STIs include gonorrhea, chlamydia, syphilis, trichomonas, herpes, HPV, and human immunodeficiency virus (HIV). Herpes, HIV, and HPV are viral illnesses that have no cure. They can result in disability, cancer, and death.  You should be screened for sexually transmitted illnesses (STIs) including gonorrhea and chlamydia if:  You are sexually active and are younger than 24 years.  You are older than 24 years and your health care provider tells you that you are at risk for this type of infection.  Your sexual activity has changed  since you were last screened and you are at an increased risk for chlamydia or gonorrhea. Ask your health care provider if you are at risk.  If you are at risk of being infected with HIV, it is recommended that you take a prescription medicine daily to prevent HIV infection. This is called preexposure prophylaxis (PrEP). You are considered at risk if:  You are sexually active and do not regularly use condoms or know the HIV status of your partner(s).  You take drugs by injection.  You are sexually active with a partner who has HIV.  Talk with your health care provider about whether you are at high risk of being infected with HIV. If   you choose to begin PrEP, you should first be tested for HIV. You should then be tested every 3 months for as long as you are taking PrEP.  Osteoporosis is a disease in which the bones lose minerals and strength with aging. This can result in serious bone fractures or breaks. The risk of osteoporosis can be identified using a bone density scan. Women ages 67 years and over and women at risk for fractures or osteoporosis should discuss screening with their health care providers. Ask your health care provider whether you should take a calcium supplement or vitamin D to reduce the rate of osteoporosis.  Menopause can be associated with physical symptoms and risks. Hormone replacement therapy is available to decrease symptoms and risks. You should talk to your health care provider about whether hormone replacement therapy is right for you.  Use sunscreen. Apply sunscreen liberally and repeatedly throughout the day. You should seek shade when your shadow is shorter than you. Protect yourself by wearing long sleeves, pants, a wide-brimmed hat, and sunglasses year round, whenever you are outdoors.  Once a month, do a whole body skin exam, using a mirror to look at the skin on your back. Tell your health care provider of new moles, moles that have irregular borders, moles that  are larger than a pencil eraser, or moles that have changed in shape or color.  Stay current with required vaccines (immunizations).  Influenza vaccine. All adults should be immunized every year.  Tetanus, diphtheria, and acellular pertussis (Td, Tdap) vaccine. Pregnant women should receive 1 dose of Tdap vaccine during each pregnancy. The dose should be obtained regardless of the length of time since the last dose. Immunization is preferred during the 27th-36th week of gestation. An adult who has not previously received Tdap or who does not know her vaccine status should receive 1 dose of Tdap. This initial dose should be followed by tetanus and diphtheria toxoids (Td) booster doses every 10 years. Adults with an unknown or incomplete history of completing a 3-dose immunization series with Td-containing vaccines should begin or complete a primary immunization series including a Tdap dose. Adults should receive a Td booster every 10 years.  Varicella vaccine. An adult without evidence of immunity to varicella should receive 2 doses or a second dose if she has previously received 1 dose. Pregnant females who do not have evidence of immunity should receive the first dose after pregnancy. This first dose should be obtained before leaving the health care facility. The second dose should be obtained 4-8 weeks after the first dose.  Human papillomavirus (HPV) vaccine. Females aged 13-26 years who have not received the vaccine previously should obtain the 3-dose series. The vaccine is not recommended for use in pregnant females. However, pregnancy testing is not needed before receiving a dose. If a female is found to be pregnant after receiving a dose, no treatment is needed. In that case, the remaining doses should be delayed until after the pregnancy. Immunization is recommended for any person with an immunocompromised condition through the age of 61 years if she did not get any or all doses earlier. During the  3-dose series, the second dose should be obtained 4-8 weeks after the first dose. The third dose should be obtained 24 weeks after the first dose and 16 weeks after the second dose.  Zoster vaccine. One dose is recommended for adults aged 30 years or older unless certain conditions are present.  Measles, mumps, and rubella (MMR) vaccine. Adults born  before 1957 generally are considered immune to measles and mumps. Adults born in 1957 or later should have 1 or more doses of MMR vaccine unless there is a contraindication to the vaccine or there is laboratory evidence of immunity to each of the three diseases. A routine second dose of MMR vaccine should be obtained at least 28 days after the first dose for students attending postsecondary schools, health care workers, or international travelers. People who received inactivated measles vaccine or an unknown type of measles vaccine during 1963-1967 should receive 2 doses of MMR vaccine. People who received inactivated mumps vaccine or an unknown type of mumps vaccine before 1979 and are at high risk for mumps infection should consider immunization with 2 doses of MMR vaccine. For females of childbearing age, rubella immunity should be determined. If there is no evidence of immunity, females who are not pregnant should be vaccinated. If there is no evidence of immunity, females who are pregnant should delay immunization until after pregnancy. Unvaccinated health care workers born before 1957 who lack laboratory evidence of measles, mumps, or rubella immunity or laboratory confirmation of disease should consider measles and mumps immunization with 2 doses of MMR vaccine or rubella immunization with 1 dose of MMR vaccine.  Pneumococcal 13-valent conjugate (PCV13) vaccine. When indicated, a person who is uncertain of his immunization history and has no record of immunization should receive the PCV13 vaccine. All adults 65 years of age and older should receive this  vaccine. An adult aged 19 years or older who has certain medical conditions and has not been previously immunized should receive 1 dose of PCV13 vaccine. This PCV13 should be followed with a dose of pneumococcal polysaccharide (PPSV23) vaccine. Adults who are at high risk for pneumococcal disease should obtain the PPSV23 vaccine at least 8 weeks after the dose of PCV13 vaccine. Adults older than 77 years of age who have normal immune system function should obtain the PPSV23 vaccine dose at least 1 year after the dose of PCV13 vaccine.  Pneumococcal polysaccharide (PPSV23) vaccine. When PCV13 is also indicated, PCV13 should be obtained first. All adults aged 65 years and older should be immunized. An adult younger than age 65 years who has certain medical conditions should be immunized. Any person who resides in a nursing home or long-term care facility should be immunized. An adult smoker should be immunized. People with an immunocompromised condition and certain other conditions should receive both PCV13 and PPSV23 vaccines. People with human immunodeficiency virus (HIV) infection should be immunized as soon as possible after diagnosis. Immunization during chemotherapy or radiation therapy should be avoided. Routine use of PPSV23 vaccine is not recommended for American Indians, [place] Natives, or people younger than 65 years unless there are medical conditions that require PPSV23 vaccine. When indicated, people who have unknown immunization and have no record of immunization should receive PPSV23 vaccine. One-time revaccination 5 years after the first dose of PPSV23 is recommended for people aged 19-64 years who have chronic kidney failure, nephrotic syndrome, asplenia, or immunocompromised conditions. People who received 1-2 doses of PPSV23 before age 65 years should receive another dose of PPSV23 vaccine at age 65 years or later if at least 5 years have passed since the previous dose. Doses of PPSV23 are not  needed for people immunized with PPSV23 at or after age 65 years.  Meningococcal vaccine. Adults with asplenia or persistent complement component deficiencies should receive 2 doses of quadrivalent meningococcal conjugate (MenACWY-D) vaccine. The doses should be obtained   at least 2 months apart. Microbiologists working with certain meningococcal bacteria, [place] recruits, people at risk during an outbreak, and people who travel to or live in countries with a high rate of meningitis should be immunized. A first-year college student up through age 34 years who is living in a residence hall should receive a dose if she did not receive a dose on or after her 16th birthday. Adults who have certain high-risk conditions should receive one or more doses of vaccine.  Hepatitis A vaccine. Adults who wish to be protected from this disease, have certain high-risk conditions, work with hepatitis A-infected animals, work in hepatitis A research labs, or travel to or work in countries with a high rate of hepatitis A should be immunized. Adults who were previously unvaccinated and who anticipate close contact with an international adoptee during the first 60 days after arrival in the [place] from a country with a high rate of hepatitis A should be immunized.  Hepatitis B vaccine. Adults who wish to be protected from this disease, have certain high-risk conditions, may be exposed to blood or other infectious body fluids, are household contacts or sex partners of hepatitis B positive people, are clients or workers in certain care facilities, or travel to or work in countries with a high rate of hepatitis B should be immunized.  Haemophilus influenzae type b (Hib) vaccine. A previously unvaccinated person with asplenia or sickle cell disease or having a scheduled splenectomy should receive 1 dose of Hib vaccine. Regardless of previous immunization, a recipient of a hematopoietic stem cell transplant should receive a  3-dose series 6-12 months after her successful transplant. Hib vaccine is not recommended for adults with HIV infection. Preventive Services / Frequency Ages 35 to 4 years  Blood pressure check.** / Every 3-5 years.  Lipid and cholesterol check.** / Every 5 years beginning at age 60.  Clinical breast exam.** / Every 3 years for women in their 71s and 10s.  BRCA-related cancer risk assessment.** / For women who have family members with a BRCA-related cancer (breast, ovarian, tubal, or peritoneal cancers).  Pap test.** / Every 2 years from ages 76 through 26. Every 3 years starting at age 61 through age 76 or 93 with a history of 3 consecutive normal Pap tests.  HPV screening.** / Every 3 years from ages 37 through ages 60 to 51 with a history of 3 consecutive normal Pap tests.  Hepatitis C blood test.** / For any individual with known risks for hepatitis C.  Skin self-exam. / Monthly.  Influenza vaccine. / Every year.  Tetanus, diphtheria, and acellular pertussis (Tdap, Td) vaccine.** / Consult your health care provider. Pregnant women should receive 1 dose of Tdap vaccine during each pregnancy. 1 dose of Td every 10 years.  Varicella vaccine.** / Consult your health care provider. Pregnant females who do not have evidence of immunity should receive the first dose after pregnancy.  HPV vaccine. / 3 doses over 6 months, if 93 and younger. The vaccine is not recommended for use in pregnant females. However, pregnancy testing is not needed before receiving a dose.  Measles, mumps, rubella (MMR) vaccine.** / You need at least 1 dose of MMR if you were born in 1957 or later. You may also need a 2nd dose. For females of childbearing age, rubella immunity should be determined. If there is no evidence of immunity, females who are not pregnant should be vaccinated. If there is no evidence of immunity, females who are  pregnant should delay immunization until after pregnancy.  Pneumococcal  13-valent conjugate (PCV13) vaccine.** / Consult your health care provider.  Pneumococcal polysaccharide (PPSV23) vaccine.** / 1 to 2 doses if you smoke cigarettes or if you have certain conditions.  Meningococcal vaccine.** / 1 dose if you are age 68 to 8 years and a Market researcher living in a residence hall, or have one of several medical conditions, you need to get vaccinated against meningococcal disease. You may also need additional booster doses.  Hepatitis A vaccine.** / Consult your health care provider.  Hepatitis B vaccine.** / Consult your health care provider.  Haemophilus influenzae type b (Hib) vaccine.** / Consult your health care provider. Ages 7 to 53 years  Blood pressure check.** / Every year.  Lipid and cholesterol check.** / Every 5 years beginning at age 25 years.  Lung cancer screening. / Every year if you are aged 11-80 years and have a 30-pack-year history of smoking and currently smoke or have quit within the past 15 years. Yearly screening is stopped once you have quit smoking for at least 15 years or develop a health problem that would prevent you from having lung cancer treatment.  Clinical breast exam.** / Every year after age 48 years.  BRCA-related cancer risk assessment.** / For women who have family members with a BRCA-related cancer (breast, ovarian, tubal, or peritoneal cancers).  Mammogram.** / Every year beginning at age 41 years and continuing for as long as you are in good health. Consult with your health care provider.  Pap test.** / Every 3 years starting at age 65 years through age 37 or 70 years with a history of 3 consecutive normal Pap tests.  HPV screening.** / Every 3 years from ages 72 years through ages 60 to 40 years with a history of 3 consecutive normal Pap tests.  Fecal occult blood test (FOBT) of stool. / Every year beginning at age 21 years and continuing until age 5 years. You may not need to do this test if you get  a colonoscopy every 10 years.  Flexible sigmoidoscopy or colonoscopy.** / Every 5 years for a flexible sigmoidoscopy or every 10 years for a colonoscopy beginning at age 35 years and continuing until age 48 years.  Hepatitis C blood test.** / For all people born from [birth year] through 1965 and any individual with known risks for hepatitis C.  Skin self-exam. / Monthly.  Influenza vaccine. / Every year.  Tetanus, diphtheria, and acellular pertussis (Tdap/Td) vaccine.** / Consult your health care provider. Pregnant women should receive 1 dose of Tdap vaccine during each pregnancy. 1 dose of Td every 10 years.  Varicella vaccine.** / Consult your health care provider. Pregnant females who do not have evidence of immunity should receive the first dose after pregnancy.  Zoster vaccine.** / 1 dose for adults aged 30 years or older.  Measles, mumps, rubella (MMR) vaccine.** / You need at least 1 dose of MMR if you were born in 1957 or later. You may also need a second dose. For females of childbearing age, rubella immunity should be determined. If there is no evidence of immunity, females who are not pregnant should be vaccinated. If there is no evidence of immunity, females who are pregnant should delay immunization until after pregnancy.  Pneumococcal 13-valent conjugate (PCV13) vaccine.** / Consult your health care provider.  Pneumococcal polysaccharide (PPSV23) vaccine.** / 1 to 2 doses if you smoke cigarettes or if you have certain conditions.  Meningococcal vaccine.** /  Consult your health care provider.  Hepatitis A vaccine.** / Consult your health care provider.  Hepatitis B vaccine.** / Consult your health care provider.  Haemophilus influenzae type b (Hib) vaccine.** / Consult your health care provider. Ages 64 years and over  Blood pressure check.** / Every year.  Lipid and cholesterol check.** / Every 5 years beginning at age 23 years.  Lung cancer screening. / Every year if you  are aged 16-80 years and have a 30-pack-year history of smoking and currently smoke or have quit within the past 15 years. Yearly screening is stopped once you have quit smoking for at least 15 years or develop a health problem that would prevent you from having lung cancer treatment.  Clinical breast exam.** / Every year after age 74 years.  BRCA-related cancer risk assessment.** / For women who have family members with a BRCA-related cancer (breast, ovarian, tubal, or peritoneal cancers).  Mammogram.** / Every year beginning at age 44 years and continuing for as long as you are in good health. Consult with your health care provider.  Pap test.** / Every 3 years starting at age 58 years through age 22 or 39 years with 3 consecutive normal Pap tests. Testing can be stopped between 65 and 70 years with 3 consecutive normal Pap tests and no abnormal Pap or HPV tests in the past 10 years.  HPV screening.** / Every 3 years from ages 64 years through ages 70 or 61 years with a history of 3 consecutive normal Pap tests. Testing can be stopped between 65 and 70 years with 3 consecutive normal Pap tests and no abnormal Pap or HPV tests in the past 10 years.  Fecal occult blood test (FOBT) of stool. / Every year beginning at age 40 years and continuing until age 27 years. You may not need to do this test if you get a colonoscopy every 10 years.  Flexible sigmoidoscopy or colonoscopy.** / Every 5 years for a flexible sigmoidoscopy or every 10 years for a colonoscopy beginning at age 7 years and continuing until age 32 years.  Hepatitis C blood test.** / For all people born from [birth year] through 1967 and any individual with known risks for hepatitis C.  Osteoporosis screening.** / A one-time screening for women ages 30 years and over and women at risk for fractures or osteoporosis.  Skin self-exam. / Monthly.  Influenza vaccine. / Every year.  Tetanus, diphtheria, and acellular pertussis (Tdap/Td)  vaccine.** / 1 dose of Td every 10 years.  Varicella vaccine.** / Consult your health care provider.  Zoster vaccine.** / 1 dose for adults aged 35 years or older.  Pneumococcal 13-valent conjugate (PCV13) vaccine.** / Consult your health care provider.  Pneumococcal polysaccharide (PPSV23) vaccine.** / 1 dose for all adults aged 46 years and older.  Meningococcal vaccine.** / Consult your health care provider.  Hepatitis A vaccine.** / Consult your health care provider.  Hepatitis B vaccine.** / Consult your health care provider.  Haemophilus influenzae type b (Hib) vaccine.** / Consult your health care provider. ** Family history and personal history of risk and conditions may change your health care provider's recommendations.   This information is not intended to replace advice given to you by your health care provider. Make sure you discuss any questions you have with your health care provider.   Document Released: 11/26/2001 Document Revised: 10/21/2014 Document Reviewed: 02/25/2011 Elsevier Interactive Patient Education [employer].

## 2015-12-05 DIAGNOSIS — H524 Presbyopia: Secondary | ICD-10-CM | POA: Diagnosis not present

## 2015-12-05 DIAGNOSIS — H26493 Other secondary cataract, bilateral: Secondary | ICD-10-CM | POA: Diagnosis not present

## 2015-12-12 ENCOUNTER — Other Ambulatory Visit: Payer: Self-pay

## 2015-12-12 DIAGNOSIS — Z1231 Encounter for screening mammogram for malignant neoplasm of breast: Secondary | ICD-10-CM

## 2015-12-24 ENCOUNTER — Other Ambulatory Visit: Payer: Self-pay | Admitting: Family Medicine

## 2015-12-25 ENCOUNTER — Other Ambulatory Visit: Payer: Self-pay | Admitting: Family Medicine

## 2016-01-16 DIAGNOSIS — M25562 Pain in left knee: Secondary | ICD-10-CM | POA: Diagnosis not present

## 2016-01-16 DIAGNOSIS — M7062 Trochanteric bursitis, left hip: Secondary | ICD-10-CM | POA: Diagnosis not present

## 2016-01-19 ENCOUNTER — Ambulatory Visit
Admission: RE | Admit: 2016-01-19 | Discharge: 2016-01-19 | Disposition: A | Discharge status: Home / Self Care | Payer: PPO | Source: Ambulatory Visit

## 2016-01-19 DIAGNOSIS — Z1231 Encounter for screening mammogram for malignant neoplasm of breast: Secondary | ICD-10-CM | POA: Diagnosis not present

## 2016-02-01 DIAGNOSIS — D2221 Melanocytic nevi of right ear and external auricular canal: Secondary | ICD-10-CM | POA: Diagnosis not present

## 2016-02-01 DIAGNOSIS — D225 Melanocytic nevi of trunk: Secondary | ICD-10-CM | POA: Diagnosis not present

## 2016-02-01 DIAGNOSIS — Z86018 Personal history of other benign neoplasm: Secondary | ICD-10-CM | POA: Diagnosis not present

## 2016-02-01 DIAGNOSIS — D2222 Melanocytic nevi of left ear and external auricular canal: Secondary | ICD-10-CM | POA: Diagnosis not present

## 2016-03-07 DIAGNOSIS — M5416 Radiculopathy, lumbar region: Secondary | ICD-10-CM | POA: Diagnosis not present

## 2016-03-10 ENCOUNTER — Other Ambulatory Visit: Payer: Self-pay | Admitting: Family Medicine

## 2016-03-15 DIAGNOSIS — M545 Low back pain: Secondary | ICD-10-CM | POA: Diagnosis not present

## 2016-03-21 DIAGNOSIS — M545 Low back pain: Secondary | ICD-10-CM | POA: Diagnosis not present

## 2016-03-21 DIAGNOSIS — M5136 Other intervertebral disc degeneration, lumbar region: Secondary | ICD-10-CM | POA: Diagnosis not present

## 2016-03-21 DIAGNOSIS — M5416 Radiculopathy, lumbar region: Secondary | ICD-10-CM | POA: Diagnosis not present

## 2016-03-21 DIAGNOSIS — M4806 Spinal stenosis, lumbar region: Secondary | ICD-10-CM | POA: Diagnosis not present

## 2016-03-22 DIAGNOSIS — M5136 Other intervertebral disc degeneration, lumbar region: Secondary | ICD-10-CM | POA: Diagnosis not present

## 2016-03-22 DIAGNOSIS — M545 Low back pain: Secondary | ICD-10-CM | POA: Diagnosis not present

## 2016-03-22 DIAGNOSIS — M5416 Radiculopathy, lumbar region: Secondary | ICD-10-CM | POA: Diagnosis not present

## 2016-03-22 DIAGNOSIS — M4806 Spinal stenosis, lumbar region: Secondary | ICD-10-CM | POA: Diagnosis not present

## 2016-04-04 DIAGNOSIS — M4806 Spinal stenosis, lumbar region: Secondary | ICD-10-CM | POA: Diagnosis not present

## 2016-04-04 DIAGNOSIS — M545 Low back pain: Secondary | ICD-10-CM | POA: Diagnosis not present

## 2016-04-04 DIAGNOSIS — M5416 Radiculopathy, lumbar region: Secondary | ICD-10-CM | POA: Diagnosis not present

## 2016-04-04 DIAGNOSIS — M5136 Other intervertebral disc degeneration, lumbar region: Secondary | ICD-10-CM | POA: Diagnosis not present

## 2016-04-30 ENCOUNTER — Ambulatory Visit: Payer: PPO | Admitting: Family Medicine

## 2016-05-09 ENCOUNTER — Encounter: Payer: Self-pay | Admitting: Family Medicine

## 2016-05-09 ENCOUNTER — Ambulatory Visit (INDEPENDENT_AMBULATORY_CARE_PROVIDER_SITE_OTHER): Payer: PPO | Admitting: Family Medicine

## 2016-05-09 VITALS — BP 118/82 | HR 60 | Temp 98.3°F | Ht 59.0 in | Wt 142.6 lb

## 2016-05-09 DIAGNOSIS — F411 Generalized anxiety disorder: Secondary | ICD-10-CM

## 2016-05-09 DIAGNOSIS — E785 Hyperlipidemia, unspecified: Secondary | ICD-10-CM | POA: Diagnosis not present

## 2016-05-09 DIAGNOSIS — I10 Essential (primary) hypertension: Secondary | ICD-10-CM

## 2016-05-09 DIAGNOSIS — E2839 Other primary ovarian failure: Secondary | ICD-10-CM

## 2016-05-09 LAB — LIPID PANEL
CHOLESTEROL: 149 mg/dL (ref 0–200)
HDL: 50.5 mg/dL (ref >39.00)
LDL CALC: 74 mg/dL (ref 0–99)
NONHDL: 98.31
Total CHOL/HDL Ratio: 3
Triglycerides: 121 mg/dL (ref 0.0–149.0)
VLDL: 24.2 mg/dL (ref 0.0–40.0)

## 2016-05-09 LAB — COMPREHENSIVE METABOLIC PANEL
ALK PHOS: 69 U/L (ref 39–117)
ALT: 12 U/L (ref 0–35)
AST: 17 U/L (ref 0–37)
Albumin: 3.9 g/dL (ref 3.5–5.2)
BUN: 25 mg/dL — ABNORMAL HIGH (ref 6–23)
CHLORIDE: 105 meq/L (ref 96–112)
CO2: 30 mEq/L (ref 19–32)
Calcium: 9.7 mg/dL (ref 8.4–10.5)
Creatinine, Ser: 0.7 mg/dL (ref 0.40–1.20)
GFR: 86.24 mL/min (ref >60.00)
GLUCOSE: 100 mg/dL — AB (ref 70–99)
POTASSIUM: 4.2 meq/L (ref 3.5–5.1)
Sodium: 139 mEq/L (ref 135–145)
TOTAL PROTEIN: 7.1 g/dL (ref 6.0–8.3)
Total Bilirubin: 0.6 mg/dL (ref 0.2–1.2)

## 2016-05-09 MED ORDER — LISINOPRIL 40 MG PO TABS: ORAL_TABLET | ORAL | 3 refills | Status: DC

## 2016-05-09 MED ORDER — TRIAMTERENE-HCTZ 37.5-25 MG PO CAPS: 1.0000 | ORAL_CAPSULE | Freq: Every day | ORAL | 1 refills | Status: DC

## 2016-05-09 MED ORDER — PRAVASTATIN SODIUM 40 MG PO TABS: 40.0000 mg | ORAL_TABLET | Freq: Every day | ORAL | 1 refills | Status: DC

## 2016-05-09 MED ORDER — DILTIAZEM HCL ER BEADS 300 MG PO CP24: 300.0000 mg | ORAL_CAPSULE | Freq: Every day | ORAL | 3 refills | Status: DC

## 2016-05-09 MED ORDER — LISINOPRIL 20 MG PO TABS: 20.0000 mg | ORAL_TABLET | Freq: Every day | ORAL | 3 refills | Status: DC

## 2016-05-09 MED ORDER — CITALOPRAM HYDROBROMIDE 10 MG PO TABS: 10.0000 mg | ORAL_TABLET | Freq: Every day | ORAL | 3 refills | Status: DC

## 2016-05-09 NOTE — Assessment & Plan Note (Signed)
Running low-- dec lisinopril to 20 mg daily and recheck 2-3 weeks

## 2016-05-09 NOTE — Progress Notes (Signed)
Pre visit review using our clinic review tool, if applicable. No additional management support is needed unless otherwise documented below in the visit note. 

## 2016-05-09 NOTE — Patient Instructions (Signed)

## 2016-05-09 NOTE — Progress Notes (Signed)
Patient ID: Kimberly Edwards, female    DOB: December 13, 1940  Age: 75 y.o. MRN: [id number]    Subjective:  Subjective  HPI Kimberly Edwards presents for f/u cholesterol and bp .  She recently had an epidural for spinal stenosis and is has helped.  The mobic helps as well.  She is getting ready to travel to [place].     Review of Systems  Constitutional: Negative for appetite change, diaphoresis, fatigue and unexpected weight change.  Eyes: Negative for pain, redness and visual disturbance.  Respiratory: Negative for cough, chest tightness, shortness of breath and wheezing.   Cardiovascular: Negative for chest pain, palpitations and leg swelling.  Endocrine: Negative for cold intolerance, heat intolerance, polydipsia, polyphagia and polyuria.  Genitourinary: Negative for difficulty urinating, dysuria and frequency.  Neurological: Negative for dizziness, light-headedness, numbness and headaches.    History Past Medical History:  Diagnosis Date  . Arthritis   . Depression   . Heart murmur   . Hyperlipidemia   . Hypertension   . Knee joint replacement status, left 2003  . PONV (postoperative nausea and vomiting)   . Right knee DJD     She has a past surgical history that includes Cholecystectomy (1998); Replacement total knee; Breast biopsy (2000); Tonsillectomy; Total knee arthroplasty (03/23/2012); and Joint replacement.   Her family history includes Breast cancer in her sister; Hypertension in her father.She reports that she has been smoking Cigarettes.  She has a 5.00 pack-year smoking history. She does not have any smokeless tobacco history on file. She reports that she drinks alcohol. She reports that she does not use drugs.  Current Outpatient Prescriptions on File Prior to Visit  Medication Sig Dispense Refill  . amoxicillin (AMOXIL) 500 MG capsule 4 po before dental procedures 30 capsule 1  . aspirin 81 MG tablet Take 81 mg by mouth daily.    . fish oil-omega-3 fatty acids 1000 MG  capsule Take 3 g by mouth daily.    [name] Kitchen glucosamine-chondroitin 500-400 MG tablet Take 1 tablet by mouth daily.    . meloxicam (MOBIC) 15 MG tablet Take 1 tablet by mouth daily.    . Multiple Vitamins-Minerals (MULTIVITAMIN WITH MINERALS) tablet Take 1 tablet by mouth daily.     No current facility-administered medications on file prior to visit.      Objective:  Objective 0 Physical Exam  Constitutional: She is oriented to person, place, and time. She appears well-developed and well-nourished.  HENT:  Head: Normocephalic and atraumatic.  Eyes: Conjunctivae and EOM are normal.  Neck: Normal range of motion. Neck supple. No JVD present. Carotid bruit is not present. No thyromegaly present.  Cardiovascular: Normal rate, regular rhythm and normal heart sounds.   No murmur heard. Pulmonary/Chest: Effort normal and breath sounds normal. No respiratory distress. She has no wheezes. She has no rales. She exhibits no tenderness.  Musculoskeletal: She exhibits no edema.  Neurological: She is alert and oriented to person, place, and time.  Psychiatric: She has a normal mood and affect.  Nursing note and vitals reviewed.  BP 118/82 (BP Location: Left Arm, Patient Position: Sitting, Cuff Size: Normal)   Pulse 60   Temp 98.3 F (36.8 C) (Oral)   Ht 4\' 11"  (1.499 m)   Wt 142 lb 9.6 oz (64.7 kg)   SpO2 95%   BMI 28.80 kg/m  Wt Readings from Last 3 Encounters:  05/09/16 142 lb 9.6 oz (64.7 kg)  10/19/15 154 lb 12.8 oz (70.2 kg)  03/30/15 180 lb 12.8  oz (82 kg)     Lab Results  Component Value Date   WBC 8.8 10/19/2015   HGB 14.4 10/19/2015   HCT 44.7 10/19/2015   PLT 288.0 10/19/2015   GLUCOSE 100 (H) 10/19/2015   CHOL 158 10/19/2015   TRIG 106.0 10/19/2015   HDL 45.60 10/19/2015   LDLDIRECT 121.0 03/30/2015   LDLCALC 91 10/19/2015   ALT 9 10/19/2015   AST 18 10/19/2015   NA 138 10/19/2015   K 4.6 10/19/2015   CL 104 10/19/2015   CREATININE 0.76 10/19/2015   BUN 27 (H)  10/19/2015   CO2 29 10/19/2015   TSH 1.88 09/27/2014   INR 0.98 03/17/2012   HGBA1C 5.6 06/08/2012    Mm Screening Breast Tomo Bilateral  Result Date: 01/22/2016 CLINICAL DATA:  Screening. EXAM: 2D DIGITAL SCREENING BILATERAL MAMMOGRAM WITH CAD AND ADJUNCT TOMO COMPARISON:  Previous exam(s). ACR Breast Density Category b: There are scattered areas of fibroglandular density. FINDINGS: There are no findings suspicious for malignancy. Images were processed with CAD. IMPRESSION: No mammographic evidence of malignancy. A result letter of this screening mammogram will be mailed directly to the patient. RECOMMENDATION: Screening mammogram in one year. (Code:SM-B-01Y) BI-RADS CATEGORY  1: Negative. Electronically Signed   By: Lillia Mountain M.D.   On: 01/22/2016 08:17     Assessment & Plan:  Plan  I have discontinued Kimberly Edwards's lisinopril and lisinopril. I have also changed her triamterene-hydrochlorothiazide. Additionally, I am having her start on lisinopril. Lastly, I am having her maintain her multivitamin with minerals, aspirin, fish oil-omega-3 fatty acids, glucosamine-chondroitin, meloxicam, amoxicillin, pravastatin, diltiazem, and citalopram.  Meds ordered this encounter  Medications  . triamterene-hydrochlorothiazide (DYAZIDE) 37.5-25 MG capsule    Sig: Take 1 each (1 capsule total) by mouth daily.    Dispense:  90 capsule    Refill:  1  . pravastatin (PRAVACHOL) 40 MG tablet    Sig: Take 1 tablet (40 mg total) by mouth daily.    Dispense:  90 tablet    Refill:  1  . DISCONTD: lisinopril (PRINIVIL,ZESTRIL) 40 MG tablet    Sig: TAKE 1 TABLET (40 MG TOTAL) BY MOUTH DAILY.    Dispense:  90 tablet    Refill:  3    D/C PREVIOUS SCRIPTS FOR THIS MEDICATION  . diltiazem (TAZTIA XT) 300 MG 24 hr capsule    Sig: Take 1 capsule (300 mg total) by mouth daily.    Dispense:  90 capsule    Refill:  3    D/C PREVIOUS SCRIPTS FOR THIS MEDICATION  . citalopram (CELEXA) 10 MG tablet    Sig: Take 1  tablet (10 mg total) by mouth daily.    Dispense:  90 tablet    Refill:  3  . lisinopril (PRINIVIL,ZESTRIL) 20 MG tablet    Sig: Take 1 tablet (20 mg total) by mouth daily.    Dispense:  90 tablet    Refill:  3    Problem List Items Addressed This Visit      Unprioritized   Hypertension - Primary   Relevant Medications   triamterene-hydrochlorothiazide (DYAZIDE) 37.5-25 MG capsule   pravastatin (PRAVACHOL) 40 MG tablet   diltiazem (TAZTIA XT) 300 MG 24 hr capsule   lisinopril (PRINIVIL,ZESTRIL) 20 MG tablet   Other Relevant Orders   Comprehensive metabolic panel   Hyperlipidemia    con't pravachol and check labs       Relevant Medications   triamterene-hydrochlorothiazide (DYAZIDE) 37.5-25 MG capsule   pravastatin (PRAVACHOL)  40 MG tablet   diltiazem (TAZTIA XT) 300 MG 24 hr capsule   lisinopril (PRINIVIL,ZESTRIL) 20 MG tablet   Other Relevant Orders   Comprehensive metabolic panel   Lipid panel    Other Visit Diagnoses    Estrogen deficiency       Relevant Orders   DG Bone Density   Generalized anxiety disorder       Relevant Medications   citalopram (CELEXA) 10 MG tablet      Follow-up: Return in about 3 weeks (around 05/30/2016) for hypertension.  Ann Held, DO

## 2016-05-09 NOTE — Assessment & Plan Note (Signed)
con't pravachol and check labs

## 2016-05-25 ENCOUNTER — Other Ambulatory Visit: Payer: Self-pay | Admitting: Family Medicine

## 2016-05-25 DIAGNOSIS — I10 Essential (primary) hypertension: Secondary | ICD-10-CM

## 2016-05-30 ENCOUNTER — Ambulatory Visit (INDEPENDENT_AMBULATORY_CARE_PROVIDER_SITE_OTHER): Payer: PPO | Admitting: Family Medicine

## 2016-05-30 VITALS — BP 142/78

## 2016-05-30 DIAGNOSIS — I10 Essential (primary) hypertension: Secondary | ICD-10-CM

## 2016-05-30 MED ORDER — LISINOPRIL 30 MG PO TABS: 30.0000 mg | ORAL_TABLET | Freq: Every day | ORAL | 0 refills | Status: DC

## 2016-05-30 NOTE — Progress Notes (Signed)
Pre visit review using our clinic review tool, if applicable. No additional management support is needed unless otherwise documented below in the visit note. 

## 2016-05-31 NOTE — Assessment & Plan Note (Signed)
Inc lisinopril to 30 mg rto 2-3 weeks for ov

## 2016-06-18 ENCOUNTER — Ambulatory Visit (INDEPENDENT_AMBULATORY_CARE_PROVIDER_SITE_OTHER): Payer: PPO | Admitting: Family Medicine

## 2016-06-18 ENCOUNTER — Encounter: Payer: Self-pay | Admitting: Family Medicine

## 2016-06-18 VITALS — BP 132/82 | HR 58 | Temp 98.0°F | Wt 142.2 lb

## 2016-06-18 DIAGNOSIS — Z96653 Presence of artificial knee joint, bilateral: Secondary | ICD-10-CM

## 2016-06-18 DIAGNOSIS — Z23 Encounter for immunization: Secondary | ICD-10-CM

## 2016-06-18 DIAGNOSIS — I10 Essential (primary) hypertension: Secondary | ICD-10-CM | POA: Diagnosis not present

## 2016-06-18 DIAGNOSIS — E785 Hyperlipidemia, unspecified: Secondary | ICD-10-CM

## 2016-06-18 DIAGNOSIS — F411 Generalized anxiety disorder: Secondary | ICD-10-CM

## 2016-06-18 MED ORDER — DILTIAZEM HCL ER BEADS 300 MG PO CP24: 300.0000 mg | ORAL_CAPSULE | Freq: Every day | ORAL | 3 refills | Status: DC

## 2016-06-18 MED ORDER — TRIAMTERENE-HCTZ 37.5-25 MG PO CAPS: 1.0000 | ORAL_CAPSULE | Freq: Every day | ORAL | 1 refills | Status: DC

## 2016-06-18 MED ORDER — CITALOPRAM HYDROBROMIDE 10 MG PO TABS: 10.0000 mg | ORAL_TABLET | Freq: Every day | ORAL | 3 refills | Status: DC

## 2016-06-18 MED ORDER — AMOXICILLIN 500 MG PO CAPS: ORAL_CAPSULE | ORAL | 1 refills | Status: DC

## 2016-06-18 MED ORDER — PRAVASTATIN SODIUM 40 MG PO TABS: 40.0000 mg | ORAL_TABLET | Freq: Every day | ORAL | 1 refills | Status: DC

## 2016-06-18 MED ORDER — LISINOPRIL 30 MG PO TABS: 30.0000 mg | ORAL_TABLET | Freq: Every day | ORAL | 1 refills | Status: DC

## 2016-06-18 NOTE — Assessment & Plan Note (Signed)
Stable con't meds 

## 2016-06-18 NOTE — Patient Instructions (Signed)
Hypertension Hypertension, commonly called high blood pressure, is when the force of blood pumping through your arteries is too strong. Your arteries are the blood vessels that carry blood from your heart throughout your body. A blood pressure reading consists of a higher number over a lower number, such as 110/72. The higher number (systolic) is the pressure inside your arteries when your heart pumps. The lower number (diastolic) is the pressure inside your arteries when your heart relaxes. Ideally you want your blood pressure below 120/80. Hypertension forces your heart to work harder to pump blood. Your arteries may become narrow or stiff. Having untreated or uncontrolled hypertension can cause heart attack, stroke, kidney disease, and other problems. RISK FACTORS Some risk factors for high blood pressure are controllable. Others are not.  Risk factors you cannot control include:   Race. You may be at higher risk if you are African American.  Age. Risk increases with age.  Gender. Men are at higher risk than women before age 45 years. After age 65, women are at higher risk than men. Risk factors you can control include:  Not getting enough exercise or physical activity.  Being overweight.  Getting too much fat, sugar, calories, or salt in your diet.  Drinking too much alcohol. SIGNS AND SYMPTOMS Hypertension does not usually cause signs or symptoms. Extremely high blood pressure (hypertensive crisis) may cause headache, anxiety, shortness of breath, and nosebleed. DIAGNOSIS To check if you have hypertension, your health care provider will measure your blood pressure while you are seated, with your arm held at the level of your heart. It should be measured at least twice using the same arm. Certain conditions can cause a difference in blood pressure between your right and left arms. A blood pressure reading that is higher than normal on one occasion does not mean that you need treatment. If  it is not clear whether you have high blood pressure, you may be asked to return on a different day to have your blood pressure checked again. Or, you may be asked to monitor your blood pressure at home for 1 or more weeks. TREATMENT Treating high blood pressure includes making lifestyle changes and possibly taking medicine. Living a healthy lifestyle can help lower high blood pressure. You may need to change some of your habits. Lifestyle changes may include:  Following the DASH diet. This diet is high in fruits, vegetables, and whole grains. It is low in salt, red meat, and added sugars.  Keep your sodium intake below 2,300 mg per day.  Getting at least 30-45 minutes of aerobic exercise at least 4 times per week.  Losing weight if necessary.  Not smoking.  Limiting alcoholic beverages.  Learning ways to reduce stress. Your health care provider may prescribe medicine if lifestyle changes are not enough to get your blood pressure under control, and if one of the following is true:  You are 18-59 years of age and your systolic blood pressure is above 140.  You are 60 years of age or older, and your systolic blood pressure is above 150.  Your diastolic blood pressure is above 90.  You have diabetes, and your systolic blood pressure is over 140 or your diastolic blood pressure is over 90.  You have kidney disease and your blood pressure is above 140/90.  You have heart disease and your blood pressure is above 140/90. Your personal target blood pressure may vary depending on your medical conditions, your age, and other factors. HOME CARE INSTRUCTIONS    Have your blood pressure rechecked as directed by your health care provider.   Take medicines only as directed by your health care provider. Follow the directions carefully. Blood pressure medicines must be taken as prescribed. The medicine does not work as well when you skip doses. Skipping doses also puts you at risk for  problems.  Do not smoke.   Monitor your blood pressure at home as directed by your health care provider. SEEK MEDICAL CARE IF:   You think you are having a reaction to medicines taken.  You have recurrent headaches or feel dizzy.  You have swelling in your ankles.  You have trouble with your vision. SEEK IMMEDIATE MEDICAL CARE IF:  You develop a severe headache or confusion.  You have unusual weakness, numbness, or feel faint.  You have severe chest or abdominal pain.  You vomit repeatedly.  You have trouble breathing. MAKE SURE YOU:   Understand these instructions.  Will watch your condition.  Will get help right away if you are not doing well or get worse.   This information is not intended to replace advice given to you by your health care provider. Make sure you discuss any questions you have with your health care provider.   Document Released: 09/30/2005 Document Revised: 02/14/2015 Document Reviewed: 07/23/2013 Elsevier Interactive Patient Education 2016 Else[employer] Inc[employer]  

## 2016-06-18 NOTE — Progress Notes (Signed)
Pre visit review using our clinic review tool, if applicable. No additional management support is needed unless otherwise documented below in the visit note. 

## 2016-06-18 NOTE — Assessment & Plan Note (Signed)
con't pravachol Check labs

## 2016-06-18 NOTE — Progress Notes (Signed)
Patient ID: Kimberly Edwards, female    DOB: [birth year]  Age: 75 y.o. MRN: [id number]    Subjective:  Subjective  HPI Kimberly Edwards presents for htn and cholesterol.   No complaints.    Review of Systems  Constitutional: Negative for appetite change, diaphoresis, fatigue and unexpected weight change.  Eyes: Negative for pain, redness and visual disturbance.  Respiratory: Negative for cough, chest tightness, shortness of breath and wheezing.   Cardiovascular: Negative for chest pain, palpitations and leg swelling.  Endocrine: Negative for cold intolerance, heat intolerance, polydipsia, polyphagia and polyuria.  Genitourinary: Negative for difficulty urinating, dysuria and frequency.  Neurological: Negative for dizziness, light-headedness, numbness and headaches.    History Past Medical History:  Diagnosis Date  . Arthritis   . Depression   . Heart murmur   . Hyperlipidemia   . Hypertension   . Knee joint replacement status, left 2003  . PONV (postoperative nausea and vomiting)   . Right knee DJD     She has a past surgical history that includes Cholecystectomy (1998); Replacement total knee; Breast biopsy (2000); Tonsillectomy; Total knee arthroplasty (03/23/2012); and Joint replacement.   Her family history includes Breast cancer in her sister; Hypertension in her father.She reports that she has been smoking Cigarettes.  She has a 5.00 pack-year smoking history. She does not have any smokeless tobacco history on file. She reports that she drinks alcohol. She reports that she does not use drugs.  Current Outpatient Prescriptions on File Prior to Visit  Medication Sig Dispense Refill  . aspirin 81 MG tablet Take 81 mg by mouth daily.    . fish oil-omega-3 fatty acids 1000 MG capsule Take 3 g by mouth daily.    [name] Kitchen glucosamine-chondroitin 500-400 MG tablet Take 1 tablet by mouth daily.    . meloxicam (MOBIC) 15 MG tablet Take 1 tablet by mouth daily.    . Multiple Vitamins-Minerals  (MULTIVITAMIN WITH MINERALS) tablet Take 1 tablet by mouth daily.     No current facility-administered medications on file prior to visit.      Objective:  Objective  Physical Exam  Constitutional: She is oriented to person, place, and time. She appears well-developed and well-nourished.  HENT:  Head: Normocephalic and atraumatic.  Eyes: Conjunctivae and EOM are normal.  Neck: Normal range of motion. Neck supple. No JVD present. Carotid bruit is not present. No thyromegaly present.  Cardiovascular: Normal rate, regular rhythm and normal heart sounds.   No murmur heard. Pulmonary/Chest: Effort normal and breath sounds normal. No respiratory distress. She has no wheezes. She has no rales. She exhibits no tenderness.  Musculoskeletal: She exhibits no edema.  Neurological: She is alert and oriented to person, place, and time.  Psychiatric: She has a normal mood and affect.  Nursing note and vitals reviewed.  BP 132/82 (BP Location: Left Arm, Patient Position: Sitting, Cuff Size: Normal)   Pulse (!) 58   Temp 98 F (36.7 C) (Oral)   Wt 142 lb 3.2 oz (64.5 kg)   SpO2 96%   BMI 28.72 kg/m  Wt Readings from Last 3 Encounters:  06/18/16 142 lb 3.2 oz (64.5 kg)  05/09/16 142 lb 9.6 oz (64.7 kg)  10/19/15 154 lb 12.8 oz (70.2 kg)     Lab Results  Component Value Date   WBC 8.8 10/19/2015   HGB 14.4 10/19/2015   HCT 44.7 10/19/2015   PLT 288.0 10/19/2015   GLUCOSE 100 (H) 05/09/2016   CHOL 149 05/09/2016   TRIG 121.0  05/09/2016   HDL 50.50 05/09/2016   LDLDIRECT 121.0 03/30/2015   LDLCALC 74 05/09/2016   ALT 12 05/09/2016   AST 17 05/09/2016   NA 139 05/09/2016   K 4.2 05/09/2016   CL 105 05/09/2016   CREATININE 0.70 05/09/2016   BUN 25 (H) 05/09/2016   CO2 30 05/09/2016   TSH 1.88 09/27/2014   INR 0.98 03/17/2012   HGBA1C 5.6 06/08/2012    Mm Screening Breast Tomo Bilateral  Result Date: 01/22/2016 CLINICAL DATA:  Screening. EXAM: 2D DIGITAL SCREENING BILATERAL  MAMMOGRAM WITH CAD AND ADJUNCT TOMO COMPARISON:  Previous exam(s). ACR Breast Density Category b: There are scattered areas of fibroglandular density. FINDINGS: There are no findings suspicious for malignancy. Images were processed with CAD. IMPRESSION: No mammographic evidence of malignancy. A result letter of this screening mammogram will be mailed directly to the patient. RECOMMENDATION: Screening mammogram in one year. (Code:SM-B-01Y) BI-RADS CATEGORY  1: Negative. Electronically Signed   By: Lillia Mountain M.D.   On: 01/22/2016 08:17     Assessment & Plan:  Plan  I am having Ms. Gottschall maintain her multivitamin with minerals, aspirin, fish oil-omega-3 fatty acids, glucosamine-chondroitin, meloxicam, amoxicillin, citalopram, diltiazem, lisinopril, pravastatin, and triamterene-hydrochlorothiazide.  Meds ordered this encounter  Medications  . amoxicillin (AMOXIL) 500 MG capsule    Sig: 4 po before dental procedures    Dispense:  30 capsule    Refill:  1  . citalopram (CELEXA) 10 MG tablet    Sig: Take 1 tablet (10 mg total) by mouth daily.    Dispense:  90 tablet    Refill:  3  . diltiazem (TAZTIA XT) 300 MG 24 hr capsule    Sig: Take 1 capsule (300 mg total) by mouth daily.    Dispense:  90 capsule    Refill:  3    D/C PREVIOUS SCRIPTS FOR THIS MEDICATION  . lisinopril (PRINIVIL,ZESTRIL) 30 MG tablet    Sig: Take 1 tablet (30 mg total) by mouth daily.    Dispense:  90 tablet    Refill:  1  . pravastatin (PRAVACHOL) 40 MG tablet    Sig: Take 1 tablet (40 mg total) by mouth daily.    Dispense:  90 tablet    Refill:  1  . triamterene-hydrochlorothiazide (DYAZIDE) 37.5-25 MG capsule    Sig: Take 1 each (1 capsule total) by mouth daily.    Dispense:  90 capsule    Refill:  1    Problem List Items Addressed This Visit      Unprioritized   Hypertension - Primary   Relevant Medications   diltiazem (TAZTIA XT) 300 MG 24 hr capsule   lisinopril (PRINIVIL,ZESTRIL) 30 MG tablet    pravastatin (PRAVACHOL) 40 MG tablet   triamterene-hydrochlorothiazide (DYAZIDE) 37.5-25 MG capsule   Hyperlipidemia    con't pravachol Check labs      Relevant Medications   diltiazem (TAZTIA XT) 300 MG 24 hr capsule   lisinopril (PRINIVIL,ZESTRIL) 30 MG tablet   pravastatin (PRAVACHOL) 40 MG tablet   triamterene-hydrochlorothiazide (DYAZIDE) 37.5-25 MG capsule    Other Visit Diagnoses    Status post total bilateral knee replacement       Relevant Medications   amoxicillin (AMOXIL) 500 MG capsule   Generalized anxiety disorder       Relevant Medications   citalopram (CELEXA) 10 MG tablet   Encounter for immunization       Relevant Orders   Flu vaccine HIGH DOSE PF (Completed)  Follow-up: Return in about 6 months (around 12/16/2016) for hypertension.  Ann Held, DO

## 2016-06-23 ENCOUNTER — Other Ambulatory Visit: Payer: Self-pay | Admitting: Family Medicine

## 2016-06-23 DIAGNOSIS — I10 Essential (primary) hypertension: Secondary | ICD-10-CM

## 2016-06-28 DIAGNOSIS — M545 Low back pain: Secondary | ICD-10-CM | POA: Diagnosis not present

## 2016-06-28 DIAGNOSIS — M4806 Spinal stenosis, lumbar region: Secondary | ICD-10-CM | POA: Diagnosis not present

## 2016-09-11 DIAGNOSIS — M545 Low back pain: Secondary | ICD-10-CM | POA: Diagnosis not present

## 2016-09-11 DIAGNOSIS — M48061 Spinal stenosis, lumbar region without neurogenic claudication: Secondary | ICD-10-CM | POA: Diagnosis not present

## 2016-09-11 DIAGNOSIS — M5416 Radiculopathy, lumbar region: Secondary | ICD-10-CM | POA: Diagnosis not present

## 2016-10-01 DIAGNOSIS — M5416 Radiculopathy, lumbar region: Secondary | ICD-10-CM | POA: Diagnosis not present

## 2016-10-01 DIAGNOSIS — M4120 Other idiopathic scoliosis, site unspecified: Secondary | ICD-10-CM | POA: Diagnosis not present

## 2016-10-01 DIAGNOSIS — M545 Low back pain: Secondary | ICD-10-CM | POA: Diagnosis not present

## 2016-10-01 DIAGNOSIS — M48061 Spinal stenosis, lumbar region without neurogenic claudication: Secondary | ICD-10-CM | POA: Diagnosis not present

## 2016-10-05 DIAGNOSIS — M545 Low back pain: Secondary | ICD-10-CM | POA: Diagnosis not present

## 2016-10-08 DIAGNOSIS — M48061 Spinal stenosis, lumbar region without neurogenic claudication: Secondary | ICD-10-CM | POA: Diagnosis not present

## 2016-10-08 DIAGNOSIS — M5416 Radiculopathy, lumbar region: Secondary | ICD-10-CM | POA: Diagnosis not present

## 2016-10-08 DIAGNOSIS — M4120 Other idiopathic scoliosis, site unspecified: Secondary | ICD-10-CM | POA: Diagnosis not present

## 2016-10-08 DIAGNOSIS — M545 Low back pain: Secondary | ICD-10-CM | POA: Diagnosis not present

## 2016-10-10 DIAGNOSIS — M4726 Other spondylosis with radiculopathy, lumbar region: Secondary | ICD-10-CM | POA: Diagnosis not present

## 2016-10-10 DIAGNOSIS — M545 Low back pain: Secondary | ICD-10-CM | POA: Diagnosis not present

## 2016-10-10 DIAGNOSIS — M5432 Sciatica, left side: Secondary | ICD-10-CM | POA: Diagnosis not present

## 2016-10-10 DIAGNOSIS — M419 Scoliosis, unspecified: Secondary | ICD-10-CM | POA: Diagnosis not present

## 2016-10-17 DIAGNOSIS — M5432 Sciatica, left side: Secondary | ICD-10-CM | POA: Diagnosis not present

## 2016-10-17 DIAGNOSIS — M545 Low back pain: Secondary | ICD-10-CM | POA: Diagnosis not present

## 2016-10-17 DIAGNOSIS — M419 Scoliosis, unspecified: Secondary | ICD-10-CM | POA: Diagnosis not present

## 2016-10-17 DIAGNOSIS — M4726 Other spondylosis with radiculopathy, lumbar region: Secondary | ICD-10-CM | POA: Diagnosis not present

## 2016-10-24 DIAGNOSIS — M4726 Other spondylosis with radiculopathy, lumbar region: Secondary | ICD-10-CM | POA: Diagnosis not present

## 2016-10-24 DIAGNOSIS — M419 Scoliosis, unspecified: Secondary | ICD-10-CM | POA: Diagnosis not present

## 2016-10-24 DIAGNOSIS — M545 Low back pain: Secondary | ICD-10-CM | POA: Diagnosis not present

## 2016-10-24 DIAGNOSIS — M5432 Sciatica, left side: Secondary | ICD-10-CM | POA: Diagnosis not present

## 2016-10-29 ENCOUNTER — Telehealth: Payer: Self-pay | Admitting: Family Medicine

## 2016-10-29 NOTE — Telephone Encounter (Signed)
Called patient to schedule awv. Left msg for patient to call office to schedule appt.  °

## 2016-11-05 DIAGNOSIS — M4726 Other spondylosis with radiculopathy, lumbar region: Secondary | ICD-10-CM | POA: Diagnosis not present

## 2016-11-05 DIAGNOSIS — M419 Scoliosis, unspecified: Secondary | ICD-10-CM | POA: Diagnosis not present

## 2016-11-05 DIAGNOSIS — M545 Low back pain: Secondary | ICD-10-CM | POA: Diagnosis not present

## 2016-11-07 DIAGNOSIS — M5432 Sciatica, left side: Secondary | ICD-10-CM | POA: Diagnosis not present

## 2016-11-07 DIAGNOSIS — M4726 Other spondylosis with radiculopathy, lumbar region: Secondary | ICD-10-CM | POA: Diagnosis not present

## 2016-11-07 DIAGNOSIS — M419 Scoliosis, unspecified: Secondary | ICD-10-CM | POA: Diagnosis not present

## 2016-11-07 DIAGNOSIS — M545 Low back pain: Secondary | ICD-10-CM | POA: Diagnosis not present

## 2016-11-12 ENCOUNTER — Ambulatory Visit: Payer: PPO | Admitting: Family Medicine

## 2016-11-14 DIAGNOSIS — M5432 Sciatica, left side: Secondary | ICD-10-CM | POA: Diagnosis not present

## 2016-11-14 DIAGNOSIS — M419 Scoliosis, unspecified: Secondary | ICD-10-CM | POA: Diagnosis not present

## 2016-11-14 DIAGNOSIS — M4726 Other spondylosis with radiculopathy, lumbar region: Secondary | ICD-10-CM | POA: Diagnosis not present

## 2016-11-14 DIAGNOSIS — M545 Low back pain: Secondary | ICD-10-CM | POA: Diagnosis not present

## 2016-11-19 DIAGNOSIS — M545 Low back pain: Secondary | ICD-10-CM | POA: Diagnosis not present

## 2016-11-19 DIAGNOSIS — M419 Scoliosis, unspecified: Secondary | ICD-10-CM | POA: Diagnosis not present

## 2016-11-19 DIAGNOSIS — M4726 Other spondylosis with radiculopathy, lumbar region: Secondary | ICD-10-CM | POA: Diagnosis not present

## 2016-11-19 DIAGNOSIS — M5432 Sciatica, left side: Secondary | ICD-10-CM | POA: Diagnosis not present

## 2016-12-09 ENCOUNTER — Other Ambulatory Visit: Payer: Self-pay | Admitting: Family Medicine

## 2016-12-09 DIAGNOSIS — Z1231 Encounter for screening mammogram for malignant neoplasm of breast: Secondary | ICD-10-CM

## 2016-12-10 DIAGNOSIS — H26491 Other secondary cataract, right eye: Secondary | ICD-10-CM | POA: Diagnosis not present

## 2016-12-10 DIAGNOSIS — H524 Presbyopia: Secondary | ICD-10-CM | POA: Diagnosis not present

## 2016-12-16 ENCOUNTER — Ambulatory Visit (INDEPENDENT_AMBULATORY_CARE_PROVIDER_SITE_OTHER): Payer: PPO | Admitting: Family Medicine

## 2016-12-16 ENCOUNTER — Encounter: Payer: Self-pay | Admitting: Family Medicine

## 2016-12-16 VITALS — BP 136/80 | HR 57 | Temp 98.0°F | Resp 16 | Ht 59.0 in | Wt 142.2 lb

## 2016-12-16 DIAGNOSIS — I10 Essential (primary) hypertension: Secondary | ICD-10-CM

## 2016-12-16 DIAGNOSIS — E785 Hyperlipidemia, unspecified: Secondary | ICD-10-CM

## 2016-12-16 DIAGNOSIS — M1990 Unspecified osteoarthritis, unspecified site: Secondary | ICD-10-CM

## 2016-12-16 LAB — LIPID PANEL
Cholesterol: 147 mg/dL (ref 0–200)
HDL: 51.5 mg/dL
LDL Cholesterol: 82 mg/dL (ref 0–99)
NonHDL: 95.74
Total CHOL/HDL Ratio: 3
Triglycerides: 67 mg/dL (ref 0.0–149.0)
VLDL: 13.4 mg/dL (ref 0.0–40.0)

## 2016-12-16 LAB — COMPREHENSIVE METABOLIC PANEL
ALBUMIN: 3.9 g/dL (ref 3.5–5.2)
ALK PHOS: 68 U/L (ref 39–117)
ALT: 12 U/L (ref 0–35)
AST: 18 U/L (ref 0–37)
BUN: 32 mg/dL — ABNORMAL HIGH (ref 6–23)
CALCIUM: 9.6 mg/dL (ref 8.4–10.5)
CO2: 28 mEq/L (ref 19–32)
Chloride: 108 mEq/L (ref 96–112)
Creatinine, Ser: 0.78 mg/dL (ref 0.40–1.20)
GFR: 75.99 mL/min (ref >60.00)
Glucose, Bld: 96 mg/dL (ref 70–99)
POTASSIUM: 4.3 meq/L (ref 3.5–5.1)
Sodium: 140 mEq/L (ref 135–145)
TOTAL PROTEIN: 6.8 g/dL (ref 6.0–8.3)
Total Bilirubin: 0.4 mg/dL (ref 0.2–1.2)

## 2016-12-16 MED ORDER — LISINOPRIL 30 MG PO TABS: 30.0000 mg | ORAL_TABLET | Freq: Every day | ORAL | 3 refills | Status: DC

## 2016-12-16 MED ORDER — MELOXICAM 15 MG PO TABS: 15.0000 mg | ORAL_TABLET | Freq: Every day | ORAL | 3 refills | Status: DC

## 2016-12-16 MED ORDER — TRIAMTERENE-HCTZ 37.5-25 MG PO CAPS: 1.0000 | ORAL_CAPSULE | Freq: Every day | ORAL | 3 refills | Status: DC

## 2016-12-16 NOTE — Patient Instructions (Signed)

## 2016-12-16 NOTE — Assessment & Plan Note (Addendum)
Stable Check labs con't meds-- diltiazam and lisinopril

## 2016-12-16 NOTE — Progress Notes (Signed)
Patient ID: Kimberly Edwards, female   DOB: 06-07-1941, 76 y.o.   MRN: [id number]   I acted as a Education administrator for Dr. Carollee Herter.  Guerry Bruin, CMA     Subjective:   Patient ID: Kimberly Edwards, female    DOB: 02/02/1941, 76 y.o.   MRN: [id number]  Chief Complaint  Patient presents with  . Hypertension  . Hyperlipidemia     HPI  Patient is in today for follow up blood pressure and cholesterol.    Patient Care Team: Ann Held, DO as PCP - General (Family Medicine) Jari Pigg, MD as Consulting Physician (Dermatology) Elsie Saas, MD as Consulting Physician (Orthopedic Surgery) Frederik Pear, MD as Consulting Physician (Orthopedic Surgery) Calvert Cantor, MD as Consulting Physician (Ophthalmology)   Past Medical History:  Diagnosis Date  . Arthritis   . Depression   . Heart murmur   . Hyperlipidemia   . Hypertension   . Knee joint replacement status, left 2003  . PONV (postoperative nausea and vomiting)   . Right knee DJD     Past Surgical History:  Procedure Laterality Date  . BREAST BIOPSY  2000   marker put in  . CHOLECYSTECTOMY  1998  . JOINT REPLACEMENT    . REPLACEMENT TOTAL KNEE     left knee  . TONSILLECTOMY    . TOTAL KNEE ARTHROPLASTY  03/23/2012   Procedure: TOTAL KNEE ARTHROPLASTY;  Surgeon: Lorn Junes, MD;  Location: [facility];  Service: Orthopedics;  Laterality: Right;  right total knee arthroplasty    Family History  Problem Relation Age of Onset  . Breast cancer Sister   . Hypertension Father     Social History   Social History  . Marital status: Widowed    Spouse name: N/A  . Number of children: N/A  . Years of education: N/A   Occupational History  . retired Theme park manager    Social History Main Topics  . Smoking status: Current Every Day Smoker    Packs/day: 0.10    Years: 50.00    Types: Cigarettes    Last attempt to quit: 03/23/2012  . Smokeless tobacco: Never Used     Comment: social and weekends alcohol  . Alcohol use 0.0  oz/week     Comment: <1  . Drug use: No  . Sexual activity: Not Currently    Partners: Male    Birth control/ protection: Post-menopausal   Other Topics Concern  . Not on file   Social History Narrative   Regular exercise: walk 2 times a week   Caffeine use: 2 cups of coffee daily          Outpatient Medications Prior to Visit  Medication Sig Dispense Refill  . amoxicillin (AMOXIL) 500 MG capsule 4 po before dental procedures 30 capsule 1  . aspirin 81 MG tablet Take 81 mg by mouth daily.    . citalopram (CELEXA) 10 MG tablet Take 1 tablet (10 mg total) by mouth daily. 90 tablet 3  . diltiazem (CARDIZEM CD) 300 MG 24 hr capsule TAKE ONE CAPSULE BY MOUTH EVERY DAY 90 capsule 3  . fish oil-omega-3 fatty acids 1000 MG capsule Take 3 g by mouth daily.    [name] Kitchen glucosamine-chondroitin 500-400 MG tablet Take 1 tablet by mouth daily.    . Multiple Vitamins-Minerals (MULTIVITAMIN WITH MINERALS) tablet Take 1 tablet by mouth daily.    . pravastatin (PRAVACHOL) 40 MG tablet Take 1 tablet (40 mg total) by mouth daily. 90 tablet 1  .  diltiazem (TAZTIA XT) 300 MG 24 hr capsule Take 1 capsule (300 mg total) by mouth daily. 90 capsule 3  . lisinopril (PRINIVIL,ZESTRIL) 30 MG tablet Take 1 tablet (30 mg total) by mouth daily. 90 tablet 1  . meloxicam (MOBIC) 15 MG tablet Take 1 tablet by mouth daily.    [name] Kitchen triamterene-hydrochlorothiazide (DYAZIDE) 37.5-25 MG capsule Take 1 each (1 capsule total) by mouth daily. 90 capsule 1   No facility-administered medications prior to visit.     Allergies  Allergen Reactions  . Oxycodone Nausea And Vomiting    Review of Systems  Constitutional: Negative for chills, fever and malaise/fatigue.  HENT: Negative for congestion and hearing loss.   Eyes: Negative for discharge.  Respiratory: Negative for cough, sputum production and shortness of breath.   Cardiovascular: Negative for chest pain, palpitations and leg swelling.  Gastrointestinal: Negative for  abdominal pain, blood in stool, constipation, diarrhea, heartburn, nausea and vomiting.  Genitourinary: Negative for dysuria, frequency, hematuria and urgency.  Musculoskeletal: Negative for back pain, falls and myalgias.  Skin: Negative for rash.  Neurological: Negative for dizziness, sensory change, loss of consciousness, weakness and headaches.  Endo/Heme/Allergies: Negative for environmental allergies. Does not bruise/bleed easily.  Psychiatric/Behavioral: Negative for depression and suicidal ideas. The patient is not nervous/anxious and does not have insomnia.        Objective:    Physical Exam  Constitutional: She is oriented to person, place, and time. She appears well-developed and well-nourished.  HENT:  Head: Normocephalic and atraumatic.  Eyes: Conjunctivae and EOM are normal.  Neck: Normal range of motion. Neck supple. No JVD present. Carotid bruit is not present. No thyromegaly present.  Cardiovascular: Normal rate, regular rhythm and normal heart sounds.   No murmur heard. Pulmonary/Chest: Effort normal and breath sounds normal. No respiratory distress. She has no wheezes. She has no rales. She exhibits no tenderness.  Musculoskeletal: She exhibits no edema.  Neurological: She is alert and oriented to person, place, and time.  Psychiatric: She has a normal mood and affect.  Nursing note and vitals reviewed.   BP 136/80 (BP Location: Left Arm, Cuff Size: Normal)   Pulse (!) 57   Temp 98 F (36.7 C) (Oral)   Resp 16   Ht 4\' 11"  (1.499 m)   Wt 142 lb 3.2 oz (64.5 kg)   SpO2 98%   BMI 28.72 kg/m  Wt Readings from Last 3 Encounters:  12/16/16 142 lb 3.2 oz (64.5 kg)  06/18/16 142 lb 3.2 oz (64.5 kg)  05/09/16 142 lb 9.6 oz (64.7 kg)     Lab Results  Component Value Date   WBC 8.8 10/19/2015   HGB 14.4 10/19/2015   HCT 44.7 10/19/2015   PLT 288.0 10/19/2015   GLUCOSE 96 12/16/2016   CHOL 147 12/16/2016   TRIG 67.0 12/16/2016   HDL 51.50 12/16/2016    LDLDIRECT 121.0 03/30/2015   LDLCALC 82 12/16/2016   ALT 12 12/16/2016   AST 18 12/16/2016   NA 140 12/16/2016   K 4.3 12/16/2016   CL 108 12/16/2016   CREATININE 0.78 12/16/2016   BUN 32 (H) 12/16/2016   CO2 28 12/16/2016   TSH 1.88 09/27/2014   INR 0.98 03/17/2012   HGBA1C 5.6 06/08/2012    Lab Results  Component Value Date   TSH 1.88 09/27/2014   Lab Results  Component Value Date   WBC 8.8 10/19/2015   HGB 14.4 10/19/2015   HCT 44.7 10/19/2015   MCV 89.5 10/19/2015  PLT 288.0 10/19/2015   Lab Results  Component Value Date   NA 140 12/16/2016   K 4.3 12/16/2016   CO2 28 12/16/2016   GLUCOSE 96 12/16/2016   BUN 32 (H) 12/16/2016   CREATININE 0.78 12/16/2016   BILITOT 0.4 12/16/2016   ALKPHOS 68 12/16/2016   AST 18 12/16/2016   ALT 12 12/16/2016   PROT 6.8 12/16/2016   ALBUMIN 3.9 12/16/2016   CALCIUM 9.6 12/16/2016   GFR 75.99 12/16/2016   Lab Results  Component Value Date   CHOL 147 12/16/2016   Lab Results  Component Value Date   HDL 51.50 12/16/2016   Lab Results  Component Value Date   LDLCALC 82 12/16/2016   Lab Results  Component Value Date   TRIG 67.0 12/16/2016   Lab Results  Component Value Date   CHOLHDL 3 12/16/2016   Lab Results  Component Value Date   HGBA1C 5.6 06/08/2012       Assessment & Plan:   Problem List Items Addressed This Visit      Unprioritized   Arthritis   Relevant Medications   meloxicam (MOBIC) 15 MG tablet   Hyperlipidemia - Primary   Relevant Medications   lisinopril (PRINIVIL,ZESTRIL) 30 MG tablet   triamterene-hydrochlorothiazide (DYAZIDE) 37.5-25 MG capsule   Other Relevant Orders   Lipid panel (Completed)   Hypertension    Stable Check labs con't meds-- diltiazam and lisinopril       Relevant Medications   lisinopril (PRINIVIL,ZESTRIL) 30 MG tablet   triamterene-hydrochlorothiazide (DYAZIDE) 37.5-25 MG capsule   Other Relevant Orders   Comprehensive metabolic panel (Completed)       I have changed Ms. Depace's meloxicam. I am also having her maintain her multivitamin with minerals, aspirin, fish oil-omega-3 fatty acids, glucosamine-chondroitin, amoxicillin, citalopram, pravastatin, diltiazem, Polyethyl Glycol-Propyl Glycol, lisinopril, and triamterene-hydrochlorothiazide.  Meds ordered this encounter  Medications  . Polyethyl Glycol-Propyl Glycol (SYSTANE) 0.4-0.3 % SOLN    Sig: Apply 1 drop to eye 2 (two) times daily.  [name] Kitchen lisinopril (PRINIVIL,ZESTRIL) 30 MG tablet    Sig: Take 1 tablet (30 mg total) by mouth daily.    Dispense:  90 tablet    Refill:  3  . triamterene-hydrochlorothiazide (DYAZIDE) 37.5-25 MG capsule    Sig: Take 1 each (1 capsule total) by mouth daily.    Dispense:  90 capsule    Refill:  3  . meloxicam (MOBIC) 15 MG tablet    Sig: Take 1 tablet (15 mg total) by mouth daily.    Dispense:  90 tablet    Refill:  3    CMA served as scribe during this visit. History, Physical and Plan performed by medical provider. Documentation and orders reviewed and attested to.  Ann Held, DO

## 2016-12-16 NOTE — Progress Notes (Signed)
Pre visit review using our clinic review tool, if applicable. No additional management support is needed unless otherwise documented below in the visit note. 

## 2016-12-18 ENCOUNTER — Other Ambulatory Visit: Payer: Self-pay | Admitting: Family Medicine

## 2016-12-18 ENCOUNTER — Telehealth: Payer: Self-pay | Admitting: Family Medicine

## 2016-12-18 DIAGNOSIS — Z96653 Presence of artificial knee joint, bilateral: Secondary | ICD-10-CM

## 2016-12-18 DIAGNOSIS — E785 Hyperlipidemia, unspecified: Secondary | ICD-10-CM

## 2016-12-18 NOTE — Telephone Encounter (Signed)
Caller name: [place] Relation to pt: self Call back number: [phone] Pharmacy: [id number] - [place], [place]  Reason for call: Pt states was seen on Monday 12-16-16 with provider and that was mentioned that all her prescription was going to be filled out and sent to the pharmacy, pt mentioned pharmacy did not receive amoxicillin (AMOXIL) 500 MG capsule. Pt states is needing the rx sent to pharmacy mentioned above. Please advise.

## 2016-12-19 ENCOUNTER — Other Ambulatory Visit: Payer: Self-pay | Admitting: Family Medicine

## 2016-12-19 DIAGNOSIS — I10 Essential (primary) hypertension: Secondary | ICD-10-CM

## 2016-12-19 MED ORDER — AMOXICILLIN 500 MG PO CAPS: ORAL_CAPSULE | ORAL | 1 refills | Status: DC

## 2016-12-19 NOTE — Telephone Encounter (Signed)
Ok to refill 

## 2016-12-19 NOTE — Telephone Encounter (Signed)
Sent in as instructed and patient contacted done.

## 2016-12-19 NOTE — Telephone Encounter (Signed)
Advise on this refill and I will send in if need to.

## 2016-12-24 DIAGNOSIS — H524 Presbyopia: Secondary | ICD-10-CM | POA: Diagnosis not present

## 2016-12-24 DIAGNOSIS — H26492 Other secondary cataract, left eye: Secondary | ICD-10-CM | POA: Diagnosis not present

## 2017-01-20 ENCOUNTER — Ambulatory Visit
Admission: RE | Admit: 2017-01-20 | Discharge: 2017-01-20 | Disposition: A | Discharge status: Home / Self Care | Payer: PPO | Source: Ambulatory Visit | Attending: Family Medicine | Admitting: Family Medicine

## 2017-01-20 DIAGNOSIS — Z1231 Encounter for screening mammogram for malignant neoplasm of breast: Secondary | ICD-10-CM | POA: Diagnosis not present

## 2017-02-05 DIAGNOSIS — D225 Melanocytic nevi of trunk: Secondary | ICD-10-CM | POA: Diagnosis not present

## 2017-02-05 DIAGNOSIS — L720 Epidermal cyst: Secondary | ICD-10-CM | POA: Diagnosis not present

## 2017-02-05 DIAGNOSIS — L821 Other seborrheic keratosis: Secondary | ICD-10-CM | POA: Diagnosis not present

## 2017-02-05 DIAGNOSIS — Z86018 Personal history of other benign neoplasm: Secondary | ICD-10-CM | POA: Diagnosis not present

## 2017-02-05 DIAGNOSIS — D2222 Melanocytic nevi of left ear and external auricular canal: Secondary | ICD-10-CM | POA: Diagnosis not present

## 2017-03-13 ENCOUNTER — Other Ambulatory Visit: Payer: Self-pay | Admitting: Family Medicine

## 2017-03-13 DIAGNOSIS — Z96653 Presence of artificial knee joint, bilateral: Secondary | ICD-10-CM

## 2017-03-14 ENCOUNTER — Telehealth: Payer: Self-pay | Admitting: Family Medicine

## 2017-03-14 DIAGNOSIS — Z96653 Presence of artificial knee joint, bilateral: Secondary | ICD-10-CM

## 2017-03-14 MED ORDER — AMOXICILLIN 500 MG PO CAPS: ORAL_CAPSULE | ORAL | 0 refills | Status: DC

## 2017-03-14 NOTE — Telephone Encounter (Signed)
Ok to refill 

## 2017-03-14 NOTE — Telephone Encounter (Signed)
Sent in and patient notified 

## 2017-03-14 NOTE — Telephone Encounter (Signed)
Relation to AX:ENMM Call back number:[phone] Pharmacy:  [id number] - [place], [place] [phone] (Phone) [phone] (Fax)     Reason for call:  Patient requesting a refill amoxicillin (AMOXIL) 500 MG capsule due to dental procedure at the end of June, please advise

## 2017-03-28 DIAGNOSIS — J019 Acute sinusitis, unspecified: Secondary | ICD-10-CM | POA: Diagnosis not present

## 2017-04-21 ENCOUNTER — Ambulatory Visit (HOSPITAL_BASED_OUTPATIENT_CLINIC_OR_DEPARTMENT_OTHER)
Admission: RE | Admit: 2017-04-21 | Discharge: 2017-04-21 | Disposition: A | Discharge status: Home / Self Care | Payer: PPO | Source: Ambulatory Visit | Attending: Family Medicine | Admitting: Family Medicine

## 2017-04-21 ENCOUNTER — Encounter: Payer: Self-pay | Admitting: Family Medicine

## 2017-04-21 ENCOUNTER — Ambulatory Visit (INDEPENDENT_AMBULATORY_CARE_PROVIDER_SITE_OTHER): Payer: PPO | Admitting: Family Medicine

## 2017-04-21 VITALS — BP 142/78 | HR 56 | Temp 98.6°F | Ht 59.0 in | Wt 147.5 lb

## 2017-04-21 DIAGNOSIS — M79641 Pain in right hand: Secondary | ICD-10-CM

## 2017-04-21 DIAGNOSIS — M25541 Pain in joints of right hand: Secondary | ICD-10-CM

## 2017-04-21 DIAGNOSIS — M19041 Primary osteoarthritis, right hand: Secondary | ICD-10-CM | POA: Insufficient documentation

## 2017-04-21 DIAGNOSIS — M11241 Other chondrocalcinosis, right hand: Secondary | ICD-10-CM | POA: Insufficient documentation

## 2017-04-21 NOTE — Progress Notes (Signed)
Patient ID: Kimberly Edwards, female    DOB: 1941-09-13  Age: 76 y.o. MRN: [id number]    Subjective:  Subjective  HPI Tanasia Budzinski presents for pain and deformity in r hand. No known injury.    Review of Systems  Constitutional: Negative for appetite change, diaphoresis, fatigue and unexpected weight change.  Eyes: Negative for pain, redness and visual disturbance.  Respiratory: Negative for cough, chest tightness, shortness of breath and wheezing.   Cardiovascular: Negative for chest pain, palpitations and leg swelling.  Endocrine: Negative for cold intolerance, heat intolerance, polydipsia, polyphagia and polyuria.  Genitourinary: Negative for difficulty urinating, dysuria and frequency.  Musculoskeletal: Positive for arthralgias and joint swelling. Negative for back pain, gait problem, neck pain and neck stiffness.  Neurological: Negative for dizziness, light-headedness, numbness and headaches.    History Past Medical History:  Diagnosis Date  . Arthritis   . Depression   . Heart murmur   . Hyperlipidemia   . Hypertension   . Knee joint replacement status, left 2003  . PONV (postoperative nausea and vomiting)   . Right knee DJD     She has a past surgical history that includes Cholecystectomy (1998); Replacement total knee; Breast biopsy (2000); Tonsillectomy; Total knee arthroplasty (03/23/2012); and Joint replacement.   Her family history includes Breast cancer in her sister; Hypertension in her father.She reports that she has been smoking Cigarettes.  She has a 5.00 pack-year smoking history. She has never used smokeless tobacco. She reports that she drinks alcohol. She reports that she does not use drugs.  Current Outpatient Prescriptions on File Prior to Visit  Medication Sig Dispense Refill  . amoxicillin (AMOXIL) 500 MG capsule 4 po before dental procedures 30 capsule 0  . aspirin 81 MG tablet Take 81 mg by mouth daily.    . citalopram (CELEXA) 10 MG tablet Take 1  tablet (10 mg total) by mouth daily. 90 tablet 3  . diltiazem (CARDIZEM CD) 300 MG 24 hr capsule TAKE ONE CAPSULE BY MOUTH EVERY DAY 90 capsule 3  . fish oil-omega-3 fatty acids 1000 MG capsule Take 3 g by mouth daily.    [name] Kitchen glucosamine-chondroitin 500-400 MG tablet Take 1 tablet by mouth daily.    [name] Kitchen lisinopril (PRINIVIL,ZESTRIL) 30 MG tablet Take 1 tablet (30 mg total) by mouth daily. 90 tablet 3  . lisinopril (PRINIVIL,ZESTRIL) 30 MG tablet TAKE 1 TABLET BY MOUTH DAILY 90 tablet 1  . meloxicam (MOBIC) 15 MG tablet Take 1 tablet (15 mg total) by mouth daily. 90 tablet 3  . Multiple Vitamins-Minerals (MULTIVITAMIN WITH MINERALS) tablet Take 1 tablet by mouth daily.    Vladimir Faster Glycol-Propyl Glycol (SYSTANE) 0.4-0.3 % SOLN Apply 1 drop to eye 2 (two) times daily.    . pravastatin (PRAVACHOL) 40 MG tablet Take 1 tablet (40 mg total) by mouth daily. 90 tablet 1  . pravastatin (PRAVACHOL) 40 MG tablet TAKE 1 TABLET (40 MG TOTAL) BY MOUTH DAILY. 90 tablet 1  . triamterene-hydrochlorothiazide (DYAZIDE) 37.5-25 MG capsule Take 1 each (1 capsule total) by mouth daily. 90 capsule 3   No current facility-administered medications on file prior to visit.      Objective:  Objective  Physical Exam  Musculoskeletal: She exhibits edema, tenderness and deformity.       Right wrist: She exhibits decreased range of motion, tenderness, swelling and deformity.       Arms: Nursing note and vitals reviewed.  BP (!) 142/78 (BP Location: Left Arm, Patient Position: Sitting, Cuff Size:  Normal)   Pulse (!) 56   Temp 98.6 F (37 C) (Oral)   Ht 4\' 11"  (1.499 m)   Wt 147 lb 8 oz (66.9 kg)   SpO2 97%   BMI 29.79 kg/m  Wt Readings from Last 3 Encounters:  04/21/17 147 lb 8 oz (66.9 kg)  12/16/16 142 lb 3.2 oz (64.5 kg)  06/18/16 142 lb 3.2 oz (64.5 kg)     Lab Results  Component Value Date   WBC 8.8 10/19/2015   HGB 14.4 10/19/2015   HCT 44.7 10/19/2015   PLT 288.0 10/19/2015   GLUCOSE 96 12/16/2016    CHOL 147 12/16/2016   TRIG 67.0 12/16/2016   HDL 51.50 12/16/2016   LDLDIRECT 121.0 03/30/2015   LDLCALC 82 12/16/2016   ALT 12 12/16/2016   AST 18 12/16/2016   NA 140 12/16/2016   K 4.3 12/16/2016   CL 108 12/16/2016   CREATININE 0.78 12/16/2016   BUN 32 (H) 12/16/2016   CO2 28 12/16/2016   TSH 1.88 09/27/2014   INR 0.98 03/17/2012   HGBA1C 5.6 06/08/2012    Mm Screening Breast Tomo Bilateral  Result Date: 01/20/2017 CLINICAL DATA:  Screening. EXAM: 2D DIGITAL SCREENING BILATERAL MAMMOGRAM WITH CAD AND ADJUNCT TOMO COMPARISON:  Previous exam(s). ACR Breast Density Category b: There are scattered areas of fibroglandular density. FINDINGS: There are no findings suspicious for malignancy. Images were processed with CAD. IMPRESSION: No mammographic evidence of malignancy. A result letter of this screening mammogram will be mailed directly to the patient. RECOMMENDATION: Screening mammogram in one year. (Code:SM-B-01Y) BI-RADS CATEGORY  1: Negative. Electronically Signed   By: Lillia Mountain M.D.   On: 01/20/2017 16:18     Assessment & Plan:  Plan  I am having Ms. Nesbitt maintain her multivitamin with minerals, aspirin, fish oil-omega-3 fatty acids, glucosamine-chondroitin, citalopram, pravastatin, diltiazem, Polyethyl Glycol-Propyl Glycol, lisinopril, triamterene-hydrochlorothiazide, meloxicam, pravastatin, lisinopril, and amoxicillin.  No orders of the defined types were placed in this encounter.   Problem List Items Addressed This Visit    None    Visit Diagnoses    Joint pain in fingers of right hand    -  Primary   Relevant Orders   Rheumatoid Factor   CBC with Differential/Platelet   Comprehensive metabolic panel   DG Hand Complete Right   DG Wrist 2 Views Right   Right hand pain       Relevant Orders   Rheumatoid Factor   CBC with Differential/Platelet   Comprehensive metabolic panel   DG Hand Complete Right   DG Wrist 2 Views Right    used thumb spica  splint  Follow-up: Return if symptoms worsen or fail to improve.  Ann Held, DO    Pre visit review using our clinic review tool, if applicable. No additional management support is needed unless otherwise documented below in the visit note.

## 2017-04-21 NOTE — Patient Instructions (Signed)

## 2017-04-22 ENCOUNTER — Other Ambulatory Visit: Payer: Self-pay | Admitting: Family Medicine

## 2017-04-22 DIAGNOSIS — M79641 Pain in right hand: Secondary | ICD-10-CM

## 2017-04-22 DIAGNOSIS — M7989 Other specified soft tissue disorders: Secondary | ICD-10-CM

## 2017-04-22 LAB — CBC WITH DIFFERENTIAL/PLATELET
BASOS ABS: 0.1 10*3/uL (ref 0.0–0.1)
Basophils Relative: 0.7 % (ref 0.0–3.0)
EOS ABS: 0.2 10*3/uL (ref 0.0–0.7)
Eosinophils Relative: 2.2 % (ref 0.0–5.0)
HEMATOCRIT: 43.9 % (ref 36.0–46.0)
HEMOGLOBIN: 14.9 g/dL (ref 12.0–15.0)
Lymphocytes Relative: 26 % (ref 12.0–46.0)
Lymphs Abs: 2.5 10*3/uL (ref 0.7–4.0)
MCHC: 34 g/dL (ref 30.0–36.0)
MCV: 88.1 fl (ref 78.0–100.0)
Monocytes Absolute: 0.6 10*3/uL (ref 0.1–1.0)
Monocytes Relative: 6.3 % (ref 3.0–12.0)
Neutro Abs: 6.2 10*3/uL (ref 1.4–7.7)
Neutrophils Relative %: 64.8 % (ref 43.0–77.0)
Platelets: 254 10*3/uL (ref 150.0–400.0)
RBC: 4.98 Mil/uL (ref 3.87–5.11)
RDW: 13.5 % (ref 11.5–15.5)
WBC: 9.5 10*3/uL (ref 4.0–10.5)

## 2017-04-22 LAB — RHEUMATOID FACTOR: Rhuematoid fact SerPl-aCnc: <14 IU/mL (ref <14)

## 2017-04-22 LAB — COMPREHENSIVE METABOLIC PANEL
ALK PHOS: 72 U/L (ref 39–117)
ALT: 11 U/L (ref 0–35)
AST: 17 U/L (ref 0–37)
Albumin: 3.9 g/dL (ref 3.5–5.2)
BILIRUBIN TOTAL: 0.4 mg/dL (ref 0.2–1.2)
BUN: 28 mg/dL — ABNORMAL HIGH (ref 6–23)
CO2: 27 mEq/L (ref 19–32)
Calcium: 9.4 mg/dL (ref 8.4–10.5)
Chloride: 105 mEq/L (ref 96–112)
Creatinine, Ser: 0.73 mg/dL (ref 0.40–1.20)
GFR: 81.96 mL/min (ref >60.00)
GLUCOSE: 84 mg/dL (ref 70–99)
Potassium: 4.5 mEq/L (ref 3.5–5.1)
Sodium: 138 mEq/L (ref 135–145)
TOTAL PROTEIN: 7 g/dL (ref 6.0–8.3)

## 2017-04-28 DIAGNOSIS — M65311 Trigger thumb, right thumb: Secondary | ICD-10-CM | POA: Diagnosis not present

## 2017-05-21 DIAGNOSIS — M65341 Trigger finger, right ring finger: Secondary | ICD-10-CM | POA: Diagnosis not present

## 2017-05-21 DIAGNOSIS — M1811 Unilateral primary osteoarthritis of first carpometacarpal joint, right hand: Secondary | ICD-10-CM | POA: Diagnosis not present

## 2017-06-08 ENCOUNTER — Other Ambulatory Visit: Payer: Self-pay | Admitting: Family Medicine

## 2017-06-08 DIAGNOSIS — E785 Hyperlipidemia, unspecified: Secondary | ICD-10-CM

## 2017-06-09 ENCOUNTER — Other Ambulatory Visit: Payer: Self-pay | Admitting: Family Medicine

## 2017-06-09 DIAGNOSIS — F411 Generalized anxiety disorder: Secondary | ICD-10-CM

## 2017-06-11 NOTE — Progress Notes (Signed)
Subjective:   Kimberly Edwards is a 76 y.o. female who presents for Medicare Annual (Subsequent) preventive examination. Ms.Stites is very pleasant. States she is looking forward to staying in good health for her trip to [place] and cruise to [place] next year.   Review of Systems:  No ROS.  Medicare Wellness Visit. Additional risk factors are reflected in the social history.  Cardiac Risk Factors include: advanced age (>7men, >47 women);dyslipidemia;hypertension;sedentary lifestyle;smoking/ tobacco exposure Sleep patterns: Sleeps 6-7 hrs per night. 42min nap daily. Home Safety/Smoke Alarms: Feels safe in home. Smoke alarms in place.  Living environment; residence and Firearm Safety: 1st floor.  Seat Belt Safety/Bike Helmet: Wears seat belt.   Female:       Mammo- Last 01/20/17:BI-RADS CATEGORY  1: Negative.     Dexa scan-   Ordered     CCS- declines     Objective:     Vitals: BP (!) 142/78 (BP Location: Right Arm, Patient Position: Sitting, Cuff Size: Normal)   Pulse (!) 53   Ht 4\' 11"  (1.499 m)   Wt 144 lb 6.4 oz (65.5 kg)   SpO2 98%   BMI 29.17 kg/m   Body mass index is 29.17 kg/m.   Tobacco History  Smoking Status  . Current Every Day Smoker  . Packs/day: 0.10  . Years: 50.00  . Types: Cigarettes  . Last attempt to quit: 03/23/2012  Smokeless Tobacco  . Never Used    Comment: social and weekends alcohol     Ready to quit: No Counseling given: Yes   Past Medical History:  Diagnosis Date  . Arthritis   . Depression   . Heart murmur   . Hyperlipidemia   . Hypertension   . Knee joint replacement status, left 2003  . PONV (postoperative nausea and vomiting)   . Right knee DJD    Past Surgical History:  Procedure Laterality Date  . BREAST BIOPSY  2000   marker put in  . CHOLECYSTECTOMY  1998  . JOINT REPLACEMENT    . REPLACEMENT TOTAL KNEE     left knee  . TONSILLECTOMY    . TOTAL KNEE ARTHROPLASTY  03/23/2012   Procedure: TOTAL KNEE  ARTHROPLASTY;  Surgeon: Lorn Junes, MD;  Location: [facility];  Service: Orthopedics;  Laterality: Right;  right total knee arthroplasty   Family History  Problem Relation Age of Onset  . Hypertension Father   . Breast cancer Sister    History  Sexual Activity  . Sexual activity: Not Currently  . Partners: Male  . Birth control/ protection: Post-menopausal    Outpatient Encounter Prescriptions as of 06/17/2017  Medication Sig  . amoxicillin (AMOXIL) 500 MG capsule 4 po before dental procedures  . aspirin 81 MG tablet Take 81 mg by mouth daily.  . citalopram (CELEXA) 10 MG tablet Take 1 tablet (10 mg total) by mouth daily.  [name] Kitchen diltiazem (CARDIZEM CD) 300 MG 24 hr capsule TAKE ONE CAPSULE BY MOUTH EVERY DAY  . fish oil-omega-3 fatty acids 1000 MG capsule Take 3 g by mouth daily.  [name] Kitchen glucosamine-chondroitin 500-400 MG tablet Take 1 tablet by mouth daily.  [name] Kitchen lisinopril (PRINIVIL,ZESTRIL) 30 MG tablet Take 1 tablet (30 mg total) by mouth daily.  . meloxicam (MOBIC) 15 MG tablet Take 1 tablet (15 mg total) by mouth daily.  . Multiple Vitamins-Minerals (MULTIVITAMIN WITH MINERALS) tablet Take 1 tablet by mouth daily.  . pravastatin (PRAVACHOL) 40 MG tablet Take 1 tablet (40 mg total) by  mouth daily.  [name] Kitchen triamterene-hydrochlorothiazide (DYAZIDE) 37.5-25 MG capsule Take 1 each (1 capsule total) by mouth daily.  . diclofenac sodium (VOLTAREN) 1 % GEL   . Polyethyl Glycol-Propyl Glycol (SYSTANE) 0.4-0.3 % SOLN Apply 1 drop to eye 2 (two) times daily as needed.   . [DISCONTINUED] lisinopril (PRINIVIL,ZESTRIL) 30 MG tablet TAKE 1 TABLET BY MOUTH DAILY   No facility-administered encounter medications on file as of 06/17/2017.     Activities of Daily Living In your present state of health, do you have any difficulty performing the following activities: 06/17/2017  Hearing? N  Vision? N  Comment hx cataract sx. only uses reading glasses.  Difficulty concentrating or making decisions? N  Walking or  climbing stairs? Y  Comment painful. tries to avoid   Dressing or bathing? N  Doing errands, shopping? N  Preparing Food and eating ? N  Using the Toilet? N  In the past six months, have you accidently leaked urine? N  Do you have problems with loss of bowel control? N  Managing your Medications? N  Managing your Finances? N  Housekeeping or managing your Housekeeping? N  Some recent data might be hidden    Patient Care Team: Carollee Herter, Alferd Apa, DO as PCP - General (Family Medicine) Jari Pigg, MD as Consulting Physician (Dermatology) Elsie Saas, MD as Consulting Physician (Orthopedic Surgery) Frederik Pear, MD as Consulting Physician (Orthopedic Surgery) Calvert Cantor, MD as Consulting Physician (Ophthalmology)    Assessment:     Physical assessment deferred to PCP.  Exercise Activities and Dietary recommendations Current Exercise Habits: The patient does not participate in regular exercise at present, Exercise limited by: None identified   Diet (meal preparation, eat out, water intake, caffeinated beverages, dairy products, fruits and vegetables): Weight watchers. Has lost 42lbs over the past year.   Goals      Patient Stated   . Feel good mentally and physically (pt-stated)      Fall Risk Fall Risk  06/17/2017 10/19/2015 10/19/2015 03/30/2015 03/30/2014  Falls in the past year? No No No No Yes  Number falls in past yr: - - - - 1  Injury with Fall? - - - - Yes  Risk Factor Category  - - - - High Fall Risk   Depression Screen PHQ 2/9 Scores 06/17/2017 10/19/2015 10/19/2015 03/30/2015  PHQ - 2 Score 0 0 0 0     Cognitive Function Ad8 score reviewed for issues:  Issues making decisions:no  Less interest in hobbies / activities:no  Repeats questions, stories (family complaining):no  Trouble using ordinary gadgets (microwave, computer, phone):no  Forgets the month or year: no  Mismanaging finances: no  Remembering appts:no  Daily problems with thinking and/or  memory:no Ad8 score is=0         Immunization History  Administered Date(s) Administered  . Influenza, High Dose Seasonal PF 06/27/2015, 06/18/2016  . Influenza,inj,Quad PF,6+ Mos 07/01/2013  . Influenza-Unspecified 06/14/2014  . Pneumococcal Conjugate-13 09/29/2014  . Pneumococcal Polysaccharide-23 10/19/2015   Screening Tests Health Maintenance  Topic Date Due  . TETANUS/TDAP  05/04/1958  . INFLUENZA VACCINE  05/14/2017  . MAMMOGRAM  01/20/2018  . DEXA SCAN  Completed  . PNA vac Low Risk Adult  Completed      Plan:   Follow up with PCP today as scheduled.  Continue to eat heart healthy diet (full of fruits, vegetables, whole grains, lean protein, water--limit salt, fat, and sugar intake) and increase physical activity as tolerated.  Continue doing brain stimulating  activities (puzzles, reading, adult coloring books, staying active) to keep memory sharp.   Schedule bone density scan. It was ordered.  I have personally reviewed and noted the following in the patient's chart:   . Medical and social history . Use of alcohol, tobacco or illicit drugs  . Current medications and supplements . Functional ability and status . Nutritional status . Physical activity . Advanced directives . List of other physicians . Hospitalizations, surgeries, and ER visits in previous 12 months . Vitals . Screenings to include cognitive, depression, and falls . Referrals and appointments  In addition, I have reviewed and discussed with patient certain preventive protocols, quality metrics, and best practice recommendations. A written personalized care plan for preventive services as well as general preventive health recommendations were provided to patient.     Shela Nevin, [place]  06/17/2017

## 2017-06-16 ENCOUNTER — Other Ambulatory Visit: Payer: Self-pay | Admitting: Family Medicine

## 2017-06-16 DIAGNOSIS — I10 Essential (primary) hypertension: Secondary | ICD-10-CM

## 2017-06-17 ENCOUNTER — Ambulatory Visit (INDEPENDENT_AMBULATORY_CARE_PROVIDER_SITE_OTHER): Payer: PPO | Admitting: Family Medicine

## 2017-06-17 ENCOUNTER — Encounter: Payer: Self-pay | Admitting: Family Medicine

## 2017-06-17 VITALS — BP 142/78 | HR 53 | Ht 59.0 in | Wt 144.4 lb

## 2017-06-17 DIAGNOSIS — Z23 Encounter for immunization: Secondary | ICD-10-CM

## 2017-06-17 DIAGNOSIS — Z0000 Encounter for general adult medical examination without abnormal findings: Secondary | ICD-10-CM

## 2017-06-17 DIAGNOSIS — E785 Hyperlipidemia, unspecified: Secondary | ICD-10-CM

## 2017-06-17 DIAGNOSIS — I10 Essential (primary) hypertension: Secondary | ICD-10-CM

## 2017-06-17 LAB — CBC WITH DIFFERENTIAL/PLATELET
BASOS ABS: 0.1 10*3/uL (ref 0.0–0.1)
Basophils Relative: 0.7 % (ref 0.0–3.0)
EOS ABS: 0.2 10*3/uL (ref 0.0–0.7)
Eosinophils Relative: 2.1 % (ref 0.0–5.0)
HCT: 47 % — ABNORMAL HIGH (ref 36.0–46.0)
Hemoglobin: 15.5 g/dL — ABNORMAL HIGH (ref 12.0–15.0)
LYMPHS ABS: 2.5 10*3/uL (ref 0.7–4.0)
Lymphocytes Relative: 33 % (ref 12.0–46.0)
MCHC: 33 g/dL (ref 30.0–36.0)
MCV: 90.6 fl (ref 78.0–100.0)
MONOS PCT: 6.5 % (ref 3.0–12.0)
Monocytes Absolute: 0.5 10*3/uL (ref 0.1–1.0)
NEUTROS PCT: 57.7 % (ref 43.0–77.0)
Neutro Abs: 4.4 10*3/uL (ref 1.4–7.7)
PLATELETS: 238 10*3/uL (ref 150.0–400.0)
RBC: 5.19 Mil/uL — ABNORMAL HIGH (ref 3.87–5.11)
RDW: 13.8 % (ref 11.5–15.5)
WBC: 7.7 10*3/uL (ref 4.0–10.5)

## 2017-06-17 LAB — COMPREHENSIVE METABOLIC PANEL
ALK PHOS: 66 U/L (ref 39–117)
ALT: 15 U/L (ref 0–35)
AST: 20 U/L (ref 0–37)
Albumin: 4.2 g/dL (ref 3.5–5.2)
BILIRUBIN TOTAL: 0.6 mg/dL (ref 0.2–1.2)
BUN: 25 mg/dL — ABNORMAL HIGH (ref 6–23)
CHLORIDE: 102 meq/L (ref 96–112)
CO2: 30 meq/L (ref 19–32)
Calcium: 10 mg/dL (ref 8.4–10.5)
Creatinine, Ser: 0.73 mg/dL (ref 0.40–1.20)
GFR: 81.92 mL/min (ref >60.00)
GLUCOSE: 96 mg/dL (ref 70–99)
Potassium: 4.5 mEq/L (ref 3.5–5.1)
Sodium: 138 mEq/L (ref 135–145)
Total Protein: 7.2 g/dL (ref 6.0–8.3)

## 2017-06-17 LAB — LIPID PANEL
CHOL/HDL RATIO: 3
Cholesterol: 155 mg/dL (ref 0–200)
HDL: 54.3 mg/dL (ref >39.00)
LDL CALC: 83 mg/dL (ref 0–99)
NonHDL: 100.94
TRIGLYCERIDES: 89 mg/dL (ref 0.0–149.0)
VLDL: 17.8 mg/dL (ref 0.0–40.0)

## 2017-06-17 NOTE — Progress Notes (Signed)
Subjective:     Kimberly Edwards is a 76 y.o. female and is here for a comprehensive physical exam. The patient reports no problems.  Social History   Social History  . Marital status: Widowed    Spouse name: N/A  . Number of children: N/A  . Years of education: N/A   Occupational History  . retired [employer] park manager    Social History Main Topics  . Smoking status: Current Every Day Smoker    Packs/day: 0.10    Years: 50.00    Types: Cigarettes    Last attempt to quit: 03/23/2012  . Smokeless tobacco: Never Used     Comment: social and weekends alcohol  . Alcohol use 0.0 oz/week     Comment: Wine on weekends  . Drug use: No  . Sexual activity: Not Currently    Partners: Male    Birth control/ protection: Post-menopausal   Other Topics Concern  . Not on file   Social History Narrative   Regular exercise: walk 2 times a week   Caffeine use: 2 cups of coffee daily         Health Maintenance  Topic Date Due  . TETANUS/TDAP  05/04/1958  . INFLUENZA VACCINE  05/14/2017  . MAMMOGRAM  01/20/2018  . DEXA SCAN  Completed  . PNA vac Low Risk Adult  Completed    The following portions of the patient's history were reviewed and updated as appropriate:  She  has a past medical history of Arthritis; Depression; Heart murmur; Hyperlipidemia; Hypertension; Knee joint replacement status, left (2003); PONV (postoperative nausea and vomiting); and Right knee DJD. She  does not have any pertinent problems on file. She  has a past surgical history that includes Cholecystectomy (1998); Replacement total knee; Breast biopsy (2000); Tonsillectomy; Total knee arthroplasty (03/23/2012); and Joint replacement. Her family history includes Breast cancer in her sister; Hypertension in her father. She  reports that she has been smoking Cigarettes.  She has a 5.00 pack-year smoking history. She has never used smokeless tobacco. She reports that she drinks alcohol. She reports that she does not use  drugs. She has a current medication list which includes the following prescription(s): amoxicillin, aspirin, citalopram, diltiazem, fish oil-omega-3 fatty acids, glucosamine-chondroitin, lisinopril, meloxicam, multivitamin with minerals, pravastatin, triamterene-hydrochlorothiazide, diclofenac sodium, diltiazem, and polyethyl glycol-propyl glycol. Current Outpatient Prescriptions on File Prior to Visit  Medication Sig Dispense Refill  . amoxicillin (AMOXIL) 500 MG capsule 4 po before dental procedures 30 capsule 0  . aspirin 81 MG tablet Take 81 mg by mouth daily.    . citalopram (CELEXA) 10 MG tablet Take 1 tablet (10 mg total) by mouth daily. 90 tablet 0  . diltiazem (CARDIZEM CD) 300 MG 24 hr capsule TAKE ONE CAPSULE BY MOUTH EVERY DAY 90 capsule 3  . fish oil-omega-3 fatty acids 1000 MG capsule Take 3 g by mouth daily.    [name] Kitchen glucosamine-chondroitin 500-400 MG tablet Take 1 tablet by mouth daily.    [name] Kitchen lisinopril (PRINIVIL,ZESTRIL) 30 MG tablet Take 1 tablet (30 mg total) by mouth daily. 90 tablet 3  . meloxicam (MOBIC) 15 MG tablet Take 1 tablet (15 mg total) by mouth daily. 90 tablet 3  . Multiple Vitamins-Minerals (MULTIVITAMIN WITH MINERALS) tablet Take 1 tablet by mouth daily.    . pravastatin (PRAVACHOL) 40 MG tablet Take 1 tablet (40 mg total) by mouth daily. 90 tablet 0  . triamterene-hydrochlorothiazide (DYAZIDE) 37.5-25 MG capsule Take 1 each (1 capsule total) by mouth daily. 90 capsule  3  . diltiazem (CARDIZEM CD) 300 MG 24 hr capsule TAKE ONE CAPSULE BY MOUTH DAILY 90 capsule 3  . Polyethyl Glycol-Propyl Glycol (SYSTANE) 0.4-0.3 % SOLN Apply 1 drop to eye 2 (two) times daily as needed.      No current facility-administered medications on file prior to visit.    She is allergic to oxycodone..  Review of Systems Review of Systems  Constitutional: Negative for activity change, appetite change and fatigue.  HENT: Negative for hearing loss, congestion, tinnitus and ear discharge.   dentist q64m Eyes: Negative for visual disturbance (see optho q1y -- vision corrected to 20/20 with glasses).  Respiratory: Negative for cough, chest tightness and shortness of breath.   Cardiovascular: Negative for chest pain, palpitations and leg swelling.  Gastrointestinal: Negative for abdominal pain, diarrhea, constipation and abdominal distention.  Genitourinary: Negative for urgency, frequency, decreased urine volume and difficulty urinating.  Musculoskeletal: Negative for back pain, arthralgias and gait problem.  Skin: Negative for color change, pallor and rash.  Neurological: Negative for dizziness, light-headedness, numbness and headaches.  Hematological: Negative for adenopathy. Does not bruise/bleed easily.  Psychiatric/Behavioral: Negative for suicidal ideas, confusion, sleep disturbance, self-injury, dysphoric mood, decreased concentration and agitation.       Objective:    BP (!) 142/78 (BP Location: Right Arm, Patient Position: Sitting, Cuff Size: Normal)   Pulse (!) 53   Ht 4\' 11"  (1.499 m)   Wt 144 lb 6.4 oz (65.5 kg)   SpO2 98%   BMI 29.17 kg/m  General appearance: alert, cooperative, appears stated age and no distress Head: Normocephalic, without obvious abnormality, atraumatic Eyes: negative findings: lids and lashes normal, conjunctivae and sclerae normal and pupils equal, round, reactive to light and accomodation Ears: normal TM's and external ear canals both ears Nose: Nares normal. Septum midline. Mucosa normal. No drainage or sinus tenderness. Throat: lips, mucosa, and tongue normal; teeth and gums normal Neck: no adenopathy, no carotid bruit, no JVD, supple, symmetrical, trachea midline and thyroid not enlarged, symmetric, no tenderness/mass/nodules Back: symmetric, no curvature. ROM normal. No CVA tenderness. Heart: regular rate and rhythm, S1, S2 normal, no murmur, click, rub or gallop Abdomen: soft, non-tender; bowel sounds normal; no masses,  no  organomegaly Pelvic: not indicated; post-menopausal, no abnormal Pap smears in past Extremities: extremities normal, atraumatic, no cyanosis or edema Pulses: 2+ and symmetric Skin: Skin color, texture, turgor normal. No rashes or lesions Lymph nodes: Cervical, supraclavicular, and axillary nodes normal. Neurologic: Alert and oriented X 3, normal strength and tone. Normal symmetric reflexes. Normal coordination and gait    Assessment:    Healthy female exam.      Plan:    ghm utd Check labs See After Visit Summary for Counseling Recommendations    Flu shot given  1. Encounter for Medicare annual wellness exam   2. Need for prophylactic vaccination and inoculation against influenza   - Flu vaccine HIGH DOSE PF (Fluzone High dose)  3. Hyperlipidemia LDL goal <100 Tolerating statin, encouraged heart healthy diet, avoid trans fats, minimize simple carbs and saturated fats. Increase exercise as tolerated - Lipid panel - CBC with Differential/Platelet - Comprehensive metabolic panel - POCT urinalysis dipstick  4. Essential hypertension Well controlled, no changes to meds. Encouraged heart healthy diet such as the DASH diet and exercise as tolerated.  - Lipid panel - CBC with Differential/Platelet - Comprehensive metabolic panel - POCT urinalysis dipstick

## 2017-06-17 NOTE — Patient Instructions (Addendum)
Kimberly Edwards , Thank you for taking time to come for your Medicare Wellness Visit. I appreciate your ongoing commitment to your health goals. Please review the following plan we discussed and let me know if I can assist you in the future.   These are the goals we discussed: Goals      Patient Stated   . Feel good mentally and physically (pt-stated)       This is a list of the screening recommended for you and due dates:  Health Maintenance  Topic Date Due  . Tetanus Vaccine  05/04/1958  . Flu Shot  05/14/2017  . Mammogram  01/20/2018  . DEXA scan (bone density measurement)  Completed  . Pneumonia vaccines  Completed   Continue to eat heart healthy diet (full of fruits, vegetables, whole grains, lean protein, water--limit salt, fat, and sugar intake) and increase physical activity as tolerated.  Continue doing brain stimulating activities (puzzles, reading, adult coloring books, staying active) to keep memory sharp.   Schedule bone density scan. It was ordered. [phone]  Health Maintenance for Postmenopausal Women Menopause is a normal process in which your reproductive ability comes to an end. This process happens gradually over a span of months to years, usually between the ages of 47 and 61. Menopause is complete when you have missed 12 consecutive menstrual periods. It is important to talk with your health care provider about some of the most common conditions that affect postmenopausal women, such as heart disease, cancer, and bone loss (osteoporosis). Adopting a healthy lifestyle and getting preventive care can help to promote your health and wellness. Those actions can also lower your chances of developing some of these common conditions. What should I know about menopause? During menopause, you may experience a number of symptoms, such as:  Moderate-to-severe hot flashes.  Night sweats.  Decrease in sex drive.  Mood  swings.  Headaches.  Tiredness.  Irritability.  Memory problems.  Insomnia.  Choosing to treat or not to treat menopausal changes is an individual decision that you make with your health care provider. What should I know about hormone replacement therapy and supplements? Hormone therapy products are effective for treating symptoms that are associated with menopause, such as hot flashes and night sweats. Hormone replacement carries certain risks, especially as you become older. If you are thinking about using estrogen or estrogen with progestin treatments, discuss the benefits and risks with your health care provider. What should I know about heart disease and stroke? Heart disease, heart attack, and stroke become more likely as you age. This may be due, in part, to the hormonal changes that your body experiences during menopause. These can affect how your body processes dietary fats, triglycerides, and cholesterol. Heart attack and stroke are both medical emergencies. There are many things that you can do to help prevent heart disease and stroke:  Have your blood pressure checked at least every 1-2 years. High blood pressure causes heart disease and increases the risk of stroke.  If you are 46-8 years old, ask your health care provider if you should take aspirin to prevent a heart attack or a stroke.  Do not use any tobacco products, including cigarettes, chewing tobacco, or electronic cigarettes. If you need help quitting, ask your health care provider.  It is important to eat a healthy diet and maintain a healthy weight. ? Be sure to include plenty of vegetables, fruits, low-fat dairy products, and lean protein. ? Avoid eating foods that are high in  solid fats, added sugars, or salt (sodium).  Get regular exercise. This is one of the most important things that you can do for your health. ? Try to exercise for at least 150 minutes each week. The type of exercise that you do should  increase your heart rate and make you sweat. This is known as moderate-intensity exercise. ? Try to do strengthening exercises at least twice each week. Do these in addition to the moderate-intensity exercise.  Know your numbers.Ask your health care provider to check your cholesterol and your blood glucose. Continue to have your blood tested as directed by your health care provider.  What should I know about cancer screening? There are several types of cancer. Take the following steps to reduce your risk and to catch any cancer development as early as possible. Breast Cancer  Practice breast self-awareness. ? This means understanding how your breasts normally appear and feel. ? It also means doing regular breast self-exams. Let your health care provider know about any changes, no matter how small.  If you are 69 or older, have a clinician do a breast exam (clinical breast exam or CBE) every year. Depending on your age, family history, and medical history, it may be recommended that you also have a yearly breast X-ray (mammogram).  If you have a family history of breast cancer, talk with your health care provider about genetic screening.  If you are at high risk for breast cancer, talk with your health care provider about having an MRI and a mammogram every year.  Breast cancer (BRCA) gene test is recommended for women who have family members with BRCA-related cancers. Results of the assessment will determine the need for genetic counseling and BRCA1 and for BRCA2 testing. BRCA-related cancers include these types: ? Breast. This occurs in males or females. ? Ovarian. ? Tubal. This may also be called fallopian tube cancer. ? Cancer of the abdominal or pelvic lining (peritoneal cancer). ? Prostate. ? Pancreatic.  Cervical, Uterine, and Ovarian Cancer Your health care provider may recommend that you be screened regularly for cancer of the pelvic organs. These include your ovaries, uterus,  and vagina. This screening involves a pelvic exam, which includes checking for microscopic changes to the surface of your cervix (Pap test).  For women ages 21-65, health care providers may recommend a pelvic exam and a Pap test every three years. For women ages 18-65, they may recommend the Pap test and pelvic exam, combined with testing for human papilloma virus (HPV), every five years. Some types of HPV increase your risk of cervical cancer. Testing for HPV may also be done on women of any age who have unclear Pap test results.  Other health care providers may not recommend any screening for nonpregnant women who are considered low risk for pelvic cancer and have no symptoms. Ask your health care provider if a screening pelvic exam is right for you.  If you have had past treatment for cervical cancer or a condition that could lead to cancer, you need Pap tests and screening for cancer for at least 20 years after your treatment. If Pap tests have been discontinued for you, your risk factors (such as having a new sexual partner) need to be reassessed to determine if you should start having screenings again. Some women have medical problems that increase the chance of getting cervical cancer. In these cases, your health care provider may recommend that you have screening and Pap tests more often.  If you have  a family history of uterine cancer or ovarian cancer, talk with your health care provider about genetic screening.  If you have vaginal bleeding after reaching menopause, tell your health care provider.  There are currently no reliable tests available to screen for ovarian cancer.  Lung Cancer Lung cancer screening is recommended for adults 56-45 years old who are at high risk for lung cancer because of a history of smoking. A yearly low-dose CT scan of the lungs is recommended if you:  Currently smoke.  Have a history of at least 30 pack-years of smoking and you currently smoke or have quit  within the past 15 years. A pack-year is smoking an average of one pack of cigarettes per day for one year.  Yearly screening should:  Continue until it has been 15 years since you quit.  Stop if you develop a health problem that would prevent you from having lung cancer treatment.  Colorectal Cancer  This type of cancer can be detected and can often be prevented.  Routine colorectal cancer screening usually begins at age 29 and continues through age 58.  If you have risk factors for colon cancer, your health care provider may recommend that you be screened at an earlier age.  If you have a family history of colorectal cancer, talk with your health care provider about genetic screening.  Your health care provider may also recommend using home test kits to check for hidden blood in your stool.  A small camera at the end of a tube can be used to examine your colon directly (sigmoidoscopy or colonoscopy). This is done to check for the earliest forms of colorectal cancer.  Direct examination of the colon should be repeated every 5-10 years until age 5. However, if early forms of precancerous polyps or small growths are found or if you have a family history or genetic risk for colorectal cancer, you may need to be screened more often.  Skin Cancer  Check your skin from head to toe regularly.  Monitor any moles. Be sure to tell your health care provider: ? About any new moles or changes in moles, especially if there is a change in a mole's shape or color. ? If you have a mole that is larger than the size of a pencil eraser.  If any of your family members has a history of skin cancer, especially at a young age, talk with your health care provider about genetic screening.  Always use sunscreen. Apply sunscreen liberally and repeatedly throughout the day.  Whenever you are outside, protect yourself by wearing long sleeves, pants, a wide-brimmed hat, and sunglasses.  What should I know  about osteoporosis? Osteoporosis is a condition in which bone destruction happens more quickly than new bone creation. After menopause, you may be at an increased risk for osteoporosis. To help prevent osteoporosis or the bone fractures that can happen because of osteoporosis, the following is recommended:  If you are 36-109 years old, get at least 1,000 mg of calcium and at least 600 mg of vitamin D per day.  If you are older than age 30 but younger than age 22, get at least 1,200 mg of calcium and at least 600 mg of vitamin D per day.  If you are older than age 38, get at least 1,200 mg of calcium and at least 800 mg of vitamin D per day.  Smoking and excessive alcohol intake increase the risk of osteoporosis. Eat foods that are rich in calcium and  vitamin D, and do weight-bearing exercises several times each week as directed by your health care provider. What should I know about how menopause affects my mental health? Depression may occur at any age, but it is more common as you become older. Common symptoms of depression include:  Low or sad mood.  Changes in sleep patterns.  Changes in appetite or eating patterns.  Feeling an overall lack of motivation or enjoyment of activities that you previously enjoyed.  Frequent crying spells.  Talk with your health care provider if you think that you are experiencing depression. What should I know about immunizations? It is important that you get and maintain your immunizations. These include:  Tetanus, diphtheria, and pertussis (Tdap) booster vaccine.  Influenza every year before the flu season begins.  Pneumonia vaccine.  Shingles vaccine.  Your health care provider may also recommend other immunizations. This information is not intended to replace advice given to you by your health care provider. Make sure you discuss any questions you have with your health care provider. Document Released: 11/22/2005 Document Revised: 04/19/2016  Document Reviewed: 07/04/2015 Elsevier Interactive Patient Education  2018 [employer].

## 2017-07-17 DIAGNOSIS — M65341 Trigger finger, right ring finger: Secondary | ICD-10-CM | POA: Diagnosis not present

## 2017-08-05 DIAGNOSIS — M65341 Trigger finger, right ring finger: Secondary | ICD-10-CM | POA: Diagnosis not present

## 2017-09-04 ENCOUNTER — Other Ambulatory Visit: Payer: Self-pay | Admitting: Family Medicine

## 2017-09-04 DIAGNOSIS — E785 Hyperlipidemia, unspecified: Secondary | ICD-10-CM

## 2017-09-05 ENCOUNTER — Other Ambulatory Visit: Payer: Self-pay | Admitting: Family Medicine

## 2017-09-05 DIAGNOSIS — F411 Generalized anxiety disorder: Secondary | ICD-10-CM

## 2017-09-09 DIAGNOSIS — M65341 Trigger finger, right ring finger: Secondary | ICD-10-CM | POA: Diagnosis not present

## 2017-09-26 ENCOUNTER — Other Ambulatory Visit: Payer: Self-pay | Admitting: Family Medicine

## 2017-09-26 DIAGNOSIS — Z96653 Presence of artificial knee joint, bilateral: Secondary | ICD-10-CM

## 2017-12-05 ENCOUNTER — Other Ambulatory Visit: Payer: Self-pay | Admitting: Family Medicine

## 2017-12-05 DIAGNOSIS — I10 Essential (primary) hypertension: Secondary | ICD-10-CM

## 2017-12-05 DIAGNOSIS — M1990 Unspecified osteoarthritis, unspecified site: Secondary | ICD-10-CM

## 2017-12-09 DIAGNOSIS — M65341 Trigger finger, right ring finger: Secondary | ICD-10-CM | POA: Diagnosis not present

## 2017-12-12 ENCOUNTER — Other Ambulatory Visit: Payer: Self-pay | Admitting: *Deleted

## 2017-12-12 DIAGNOSIS — F411 Generalized anxiety disorder: Secondary | ICD-10-CM

## 2017-12-12 MED ORDER — CITALOPRAM HYDROBROMIDE 10 MG PO TABS: 10.0000 mg | ORAL_TABLET | Freq: Every day | ORAL | 1 refills | Status: DC

## 2017-12-12 NOTE — Addendum Note (Signed)
Addended by: Kem Boroughs D on: 12/12/2017 08:29 AM   Modules accepted: Orders

## 2017-12-15 ENCOUNTER — Ambulatory Visit: Payer: PPO | Admitting: Family Medicine

## 2017-12-16 ENCOUNTER — Ambulatory Visit (INDEPENDENT_AMBULATORY_CARE_PROVIDER_SITE_OTHER): Payer: PPO | Admitting: Family Medicine

## 2017-12-16 ENCOUNTER — Encounter: Payer: Self-pay | Admitting: Family Medicine

## 2017-12-16 VITALS — BP 138/70 | HR 57 | Temp 97.9°F | Resp 14 | Ht 59.0 in | Wt 145.6 lb

## 2017-12-16 DIAGNOSIS — E785 Hyperlipidemia, unspecified: Secondary | ICD-10-CM

## 2017-12-16 DIAGNOSIS — I10 Essential (primary) hypertension: Secondary | ICD-10-CM

## 2017-12-16 LAB — COMPREHENSIVE METABOLIC PANEL
ALK PHOS: 65 U/L (ref 39–117)
ALT: 16 U/L (ref 0–35)
AST: 19 U/L (ref 0–37)
Albumin: 4 g/dL (ref 3.5–5.2)
BUN: 22 mg/dL (ref 6–23)
CO2: 31 meq/L (ref 19–32)
CREATININE: 0.69 mg/dL (ref 0.40–1.20)
Calcium: 9.7 mg/dL (ref 8.4–10.5)
Chloride: 103 mEq/L (ref 96–112)
GFR: 87.32 mL/min (ref >60.00)
GLUCOSE: 83 mg/dL (ref 70–99)
Potassium: 4.2 mEq/L (ref 3.5–5.1)
SODIUM: 138 meq/L (ref 135–145)
Total Bilirubin: 0.6 mg/dL (ref 0.2–1.2)
Total Protein: 7.2 g/dL (ref 6.0–8.3)

## 2017-12-16 LAB — LIPID PANEL
CHOL/HDL RATIO: 2
Cholesterol: 147 mg/dL (ref 0–200)
HDL: 59.1 mg/dL (ref >39.00)
LDL Cholesterol: 67 mg/dL (ref 0–99)
NonHDL: 88.04
Triglycerides: 107 mg/dL (ref 0.0–149.0)
VLDL: 21.4 mg/dL (ref 0.0–40.0)

## 2017-12-16 NOTE — Assessment & Plan Note (Signed)
Tolerating statin, encouraged heart healthy diet, avoid trans fats, minimize simple carbs and saturated fats. Increase exercise as tolerated 

## 2017-12-16 NOTE — Assessment & Plan Note (Signed)
Well controlled, no changes to meds. Encouraged heart healthy diet such as the DASH diet and exercise as tolerated.  °

## 2017-12-16 NOTE — Patient Instructions (Signed)

## 2017-12-16 NOTE — Progress Notes (Signed)
Subjective:  I acted as a Education administrator for [employer]. Yancey Flemings, [place]   Patient ID: Kimberly Edwards, female    DOB: 1941-07-12, 77 y.o.   MRN: [id number]  Chief Complaint  Patient presents with  . Follow-up    HPI  Patient is in today for 6 month follow up on hypertension, and hyperlipidemia.  Patient Care Team: Carollee Herter, Alferd Apa, DO as PCP - General (Family Medicine) Jari Pigg, MD as Consulting Physician (Dermatology) Elsie Saas, MD as Consulting Physician (Orthopedic Surgery) Frederik Pear, MD as Consulting Physician (Orthopedic Surgery) Calvert Cantor, MD as Consulting Physician (Ophthalmology)   Past Medical History:  Diagnosis Date  . Arthritis   . Depression   . Heart murmur   . Hyperlipidemia   . Hypertension   . Knee joint replacement status, left 2003  . PONV (postoperative nausea and vomiting)   . Right knee DJD     Past Surgical History:  Procedure Laterality Date  . BREAST BIOPSY  2000   marker put in  . CHOLECYSTECTOMY  1998  . JOINT REPLACEMENT    . REPLACEMENT TOTAL KNEE     left knee  . TONSILLECTOMY    . TOTAL KNEE ARTHROPLASTY  03/23/2012   Procedure: TOTAL KNEE ARTHROPLASTY;  Surgeon: Lorn Junes, MD;  Location: [facility];  Service: Orthopedics;  Laterality: Right;  right total knee arthroplasty    Family History  Problem Relation Age of Onset  . Hypertension Father   . Breast cancer Sister     Social History   Socioeconomic History  . Marital status: Widowed    Spouse name: Not on file  . Number of children: Not on file  . Years of education: Not on file  . Highest education level: Not on file  Social Needs  . Financial resource strain: Not on file  . Food insecurity - worry: Not on file  . Food insecurity - inability: Not on file  . Transportation needs - medical: Not on file  . Transportation needs - non-medical: Not on file  Occupational History  . Occupation: retired Theme park manager  Tobacco Use  . Smoking status: Current Every  Day Smoker    Packs/day: 0.10    Years: 50.00    Pack years: 5.00    Types: Cigarettes    Last attempt to quit: 03/23/2012    Years since quitting: 5.7  . Smokeless tobacco: Never Used  . Tobacco comment: social and weekends alcohol  Substance and Sexual Activity  . Alcohol use: Yes    Alcohol/week: 0.0 oz    Comment: Wine on weekends  . Drug use: No  . Sexual activity: Not Currently    Partners: Male    Birth control/protection: Post-menopausal  Other Topics Concern  . Not on file  Social History Narrative   Regular exercise: walk 2 times a week   Caffeine use: 2 cups of coffee daily          Outpatient Medications Prior to Visit  Medication Sig Dispense Refill  . amoxicillin (AMOXIL) 500 MG capsule TAKE 4 CAPSULES BY MOUTH BEFORE DENTAL PROCEDURES 30 capsule 0  . aspirin 81 MG tablet Take 81 mg by mouth daily.    . citalopram (CELEXA) 10 MG tablet Take 1 tablet (10 mg total) by mouth daily. 90 tablet 1  . diclofenac sodium (VOLTAREN) 1 % GEL     . diltiazem (CARDIZEM CD) 300 MG 24 hr capsule TAKE ONE CAPSULE BY MOUTH EVERY DAY 90 capsule 3  .  diltiazem (CARDIZEM CD) 300 MG 24 hr capsule TAKE ONE CAPSULE BY MOUTH DAILY 90 capsule 3  . fish oil-omega-3 fatty acids 1000 MG capsule Take 3 g by mouth daily.    [name] Kitchen glucosamine-chondroitin 500-400 MG tablet Take 1 tablet by mouth daily.    [name] Kitchen lisinopril (PRINIVIL,ZESTRIL) 30 MG tablet Take 1 tablet (30 mg total) by mouth daily. 90 tablet 3  . meloxicam (MOBIC) 15 MG tablet TAKE 1 TABLET (15 MG TOTAL) BY MOUTH DAILY. 90 tablet 3  . Multiple Vitamins-Minerals (MULTIVITAMIN WITH MINERALS) tablet Take 1 tablet by mouth daily.    Vladimir Faster Glycol-Propyl Glycol (SYSTANE) 0.4-0.3 % SOLN Apply 1 drop to eye 2 (two) times daily as needed.     . pravastatin (PRAVACHOL) 40 MG tablet TAKE 1 TABLET BY MOUTH EVERY DAY 90 tablet 1  . triamterene-hydrochlorothiazide (DYAZIDE) 37.5-25 MG capsule TAKE 1 EACH (1 CAPSULE TOTAL) BY MOUTH DAILY. 90  capsule 3   No facility-administered medications prior to visit.     Allergies  Allergen Reactions  . Oxycodone Nausea And Vomiting    ROS     Objective:    Physical Exam  Constitutional: She is oriented to person, place, and time. She appears well-developed and well-nourished.  HENT:  Head: Normocephalic and atraumatic.  Eyes: Conjunctivae and EOM are normal.  Neck: Normal range of motion. Neck supple. No JVD present. Carotid bruit is not present. No thyromegaly present.  Cardiovascular: Normal rate, regular rhythm and normal heart sounds.  No murmur heard. Pulmonary/Chest: Effort normal and breath sounds normal. No respiratory distress. She has no wheezes. She has no rales. She exhibits no tenderness.  Musculoskeletal: She exhibits no edema.  Neurological: She is alert and oriented to person, place, and time.  Psychiatric: She has a normal mood and affect.  Nursing note and vitals reviewed.   BP 138/70 (BP Location: Left Arm, Patient Position: Sitting, Cuff Size: Normal)   Pulse (!) 57   Temp 97.9 F (36.6 C) (Oral)   Resp 14   Ht 4\' 11"  (1.499 m)   Wt 145 lb 9.6 oz (66 kg)   SpO2 97%   BMI 29.41 kg/m  Wt Readings from Last 3 Encounters:  12/16/17 145 lb 9.6 oz (66 kg)  06/17/17 144 lb 6.4 oz (65.5 kg)  04/21/17 147 lb 8 oz (66.9 kg)   BP Readings from Last 3 Encounters:  12/16/17 138/70  06/17/17 (!) 142/78  04/21/17 (!) 142/78     Immunization History  Administered Date(s) Administered  . Influenza, High Dose Seasonal PF 06/27/2015, 06/18/2016, 06/17/2017  . Influenza,inj,Quad PF,6+ Mos 07/01/2013  . Influenza-Unspecified 06/14/2014  . Pneumococcal Conjugate-13 09/29/2014  . Pneumococcal Polysaccharide-23 10/19/2015    Health Maintenance  Topic Date Due  . TETANUS/TDAP  05/04/1958  . MAMMOGRAM  01/20/2018  . INFLUENZA VACCINE  Completed  . DEXA SCAN  Completed  . PNA vac Low Risk Adult  Completed    Lab Results  Component Value Date   WBC 7.7  06/17/2017   HGB 15.5 (H) 06/17/2017   HCT 47.0 (H) 06/17/2017   PLT 238.0 06/17/2017   GLUCOSE 96 06/17/2017   CHOL 155 06/17/2017   TRIG 89.0 06/17/2017   HDL 54.30 06/17/2017   LDLDIRECT 121.0 03/30/2015   LDLCALC 83 06/17/2017   ALT 15 06/17/2017   AST 20 06/17/2017   NA 138 06/17/2017   K 4.5 06/17/2017   CL 102 06/17/2017   CREATININE 0.73 06/17/2017   BUN 25 (H) 06/17/2017  CO2 30 06/17/2017   TSH 1.88 09/27/2014   INR 0.98 03/17/2012   HGBA1C 5.6 06/08/2012    Lab Results  Component Value Date   TSH 1.88 09/27/2014   Lab Results  Component Value Date   WBC 7.7 06/17/2017   HGB 15.5 (H) 06/17/2017   HCT 47.0 (H) 06/17/2017   MCV 90.6 06/17/2017   PLT 238.0 06/17/2017   Lab Results  Component Value Date   NA 138 06/17/2017   K 4.5 06/17/2017   CO2 30 06/17/2017   GLUCOSE 96 06/17/2017   BUN 25 (H) 06/17/2017   CREATININE 0.73 06/17/2017   BILITOT 0.6 06/17/2017   ALKPHOS 66 06/17/2017   AST 20 06/17/2017   ALT 15 06/17/2017   PROT 7.2 06/17/2017   ALBUMIN 4.2 06/17/2017   CALCIUM 10.0 06/17/2017   GFR 81.92 06/17/2017   Lab Results  Component Value Date   CHOL 155 06/17/2017   Lab Results  Component Value Date   HDL 54.30 06/17/2017   Lab Results  Component Value Date   LDLCALC 83 06/17/2017   Lab Results  Component Value Date   TRIG 89.0 06/17/2017   Lab Results  Component Value Date   CHOLHDL 3 06/17/2017   Lab Results  Component Value Date   HGBA1C 5.6 06/08/2012         Assessment & Plan:   Problem List Items Addressed This Visit      Unprioritized   HTN (hypertension)    Well controlled, no changes to meds. Encouraged heart healthy diet such as the DASH diet and exercise as tolerated.       Relevant Orders   Lipid panel   Comprehensive metabolic panel   Hyperlipidemia    Tolerating statin, encouraged heart healthy diet, avoid trans fats, minimize simple carbs and saturated fats. Increase exercise as tolerated         Other Visit Diagnoses    Hyperlipidemia LDL goal <100    -  Primary   Relevant Orders   Lipid panel   Comprehensive metabolic panel      I am having Kimberly Edwards maintain her multivitamin with minerals, aspirin, fish oil-omega-3 fatty acids, glucosamine-chondroitin, diltiazem, Polyethyl Glycol-Propyl Glycol, lisinopril, diltiazem, diclofenac sodium, pravastatin, amoxicillin, triamterene-hydrochlorothiazide, meloxicam, and citalopram.  No orders of the defined types were placed in this encounter.   CMA served as Education administrator during this visit. History, Physical and Plan performed by medical provider. Documentation and orders reviewed and attested to.  Ann Held, DO

## 2017-12-30 DIAGNOSIS — M720 Palmar fascial fibromatosis [Dupuytren]: Secondary | ICD-10-CM | POA: Diagnosis not present

## 2017-12-30 DIAGNOSIS — M65341 Trigger finger, right ring finger: Secondary | ICD-10-CM | POA: Diagnosis not present

## 2018-01-13 DIAGNOSIS — M65341 Trigger finger, right ring finger: Secondary | ICD-10-CM | POA: Diagnosis not present

## 2018-01-27 DIAGNOSIS — M65341 Trigger finger, right ring finger: Secondary | ICD-10-CM | POA: Diagnosis not present

## 2018-02-03 DIAGNOSIS — H524 Presbyopia: Secondary | ICD-10-CM | POA: Diagnosis not present

## 2018-02-03 DIAGNOSIS — H04123 Dry eye syndrome of bilateral lacrimal glands: Secondary | ICD-10-CM | POA: Diagnosis not present

## 2018-02-03 DIAGNOSIS — Z961 Presence of intraocular lens: Secondary | ICD-10-CM | POA: Diagnosis not present

## 2018-02-23 ENCOUNTER — Other Ambulatory Visit: Payer: Self-pay | Admitting: Family Medicine

## 2018-02-23 DIAGNOSIS — Z1231 Encounter for screening mammogram for malignant neoplasm of breast: Secondary | ICD-10-CM

## 2018-02-24 DIAGNOSIS — D2222 Melanocytic nevi of left ear and external auricular canal: Secondary | ICD-10-CM | POA: Diagnosis not present

## 2018-02-24 DIAGNOSIS — Z86018 Personal history of other benign neoplasm: Secondary | ICD-10-CM | POA: Diagnosis not present

## 2018-02-24 DIAGNOSIS — D485 Neoplasm of uncertain behavior of skin: Secondary | ICD-10-CM | POA: Diagnosis not present

## 2018-02-24 DIAGNOSIS — L821 Other seborrheic keratosis: Secondary | ICD-10-CM | POA: Diagnosis not present

## 2018-02-24 DIAGNOSIS — C4441 Basal cell carcinoma of skin of scalp and neck: Secondary | ICD-10-CM | POA: Diagnosis not present

## 2018-02-24 DIAGNOSIS — D225 Melanocytic nevi of trunk: Secondary | ICD-10-CM | POA: Diagnosis not present

## 2018-02-25 ENCOUNTER — Other Ambulatory Visit: Payer: Self-pay | Admitting: Family Medicine

## 2018-02-25 DIAGNOSIS — E785 Hyperlipidemia, unspecified: Secondary | ICD-10-CM

## 2018-03-01 ENCOUNTER — Other Ambulatory Visit: Payer: Self-pay | Admitting: Family Medicine

## 2018-03-01 DIAGNOSIS — I10 Essential (primary) hypertension: Secondary | ICD-10-CM

## 2018-03-05 DIAGNOSIS — M65341 Trigger finger, right ring finger: Secondary | ICD-10-CM | POA: Diagnosis not present

## 2018-03-19 DIAGNOSIS — L570 Actinic keratosis: Secondary | ICD-10-CM | POA: Diagnosis not present

## 2018-03-19 DIAGNOSIS — C4441 Basal cell carcinoma of skin of scalp and neck: Secondary | ICD-10-CM | POA: Diagnosis not present

## 2018-03-24 ENCOUNTER — Ambulatory Visit: Payer: PPO

## 2018-03-25 ENCOUNTER — Ambulatory Visit: Payer: PPO

## 2018-03-31 DIAGNOSIS — H04123 Dry eye syndrome of bilateral lacrimal glands: Secondary | ICD-10-CM | POA: Diagnosis not present

## 2018-04-08 ENCOUNTER — Ambulatory Visit
Admission: RE | Admit: 2018-04-08 | Discharge: 2018-04-08 | Disposition: A | Discharge status: Home / Self Care | Payer: PPO | Source: Ambulatory Visit | Attending: Family Medicine | Admitting: Family Medicine

## 2018-04-08 DIAGNOSIS — Z1231 Encounter for screening mammogram for malignant neoplasm of breast: Secondary | ICD-10-CM

## 2018-05-28 ENCOUNTER — Other Ambulatory Visit: Payer: Self-pay | Admitting: Family Medicine

## 2018-05-28 DIAGNOSIS — I10 Essential (primary) hypertension: Secondary | ICD-10-CM

## 2018-05-29 ENCOUNTER — Other Ambulatory Visit: Payer: Self-pay | Admitting: Family Medicine

## 2018-05-29 DIAGNOSIS — I10 Essential (primary) hypertension: Secondary | ICD-10-CM

## 2018-06-06 ENCOUNTER — Other Ambulatory Visit: Payer: Self-pay | Admitting: Family Medicine

## 2018-06-06 DIAGNOSIS — F411 Generalized anxiety disorder: Secondary | ICD-10-CM

## 2018-06-16 ENCOUNTER — Telehealth: Payer: Self-pay

## 2018-06-16 NOTE — Telephone Encounter (Signed)
Copied from [place] [id number]. Topic: Quick Communication - Appointment Cancellation >> Jun 16, 2018 10:22 AM Rutherford Nail, NT wrote: Patient called to cancel appointment scheduled for 06/18/18. Patient has not rescheduled their appointment.  Route to department's PEC pool.

## 2018-06-18 ENCOUNTER — Ambulatory Visit: Payer: PPO | Admitting: *Deleted

## 2018-06-23 ENCOUNTER — Encounter: Payer: Self-pay | Admitting: Family Medicine

## 2018-06-23 ENCOUNTER — Ambulatory Visit (INDEPENDENT_AMBULATORY_CARE_PROVIDER_SITE_OTHER): Payer: PPO | Admitting: Family Medicine

## 2018-06-23 VITALS — BP 128/78 | HR 68 | Temp 98.0°F | Resp 16 | Ht 59.0 in | Wt 147.0 lb

## 2018-06-23 DIAGNOSIS — I10 Essential (primary) hypertension: Secondary | ICD-10-CM | POA: Diagnosis not present

## 2018-06-23 DIAGNOSIS — E785 Hyperlipidemia, unspecified: Secondary | ICD-10-CM

## 2018-06-23 DIAGNOSIS — M1990 Unspecified osteoarthritis, unspecified site: Secondary | ICD-10-CM | POA: Diagnosis not present

## 2018-06-23 DIAGNOSIS — Z23 Encounter for immunization: Secondary | ICD-10-CM | POA: Diagnosis not present

## 2018-06-23 DIAGNOSIS — Z0000 Encounter for general adult medical examination without abnormal findings: Secondary | ICD-10-CM

## 2018-06-23 DIAGNOSIS — F411 Generalized anxiety disorder: Secondary | ICD-10-CM

## 2018-06-23 LAB — COMPREHENSIVE METABOLIC PANEL
ALT: 14 U/L (ref 0–35)
AST: 19 U/L (ref 0–37)
Albumin: 4.1 g/dL (ref 3.5–5.2)
Alkaline Phosphatase: 67 U/L (ref 39–117)
BILIRUBIN TOTAL: 0.6 mg/dL (ref 0.2–1.2)
BUN: 23 mg/dL (ref 6–23)
CO2: 31 mEq/L (ref 19–32)
Calcium: 9.8 mg/dL (ref 8.4–10.5)
Chloride: 103 mEq/L (ref 96–112)
Creatinine, Ser: 0.72 mg/dL (ref 0.40–1.20)
GFR: 83.02 mL/min (ref >60.00)
Glucose, Bld: 91 mg/dL (ref 70–99)
Potassium: 4.7 mEq/L (ref 3.5–5.1)
Sodium: 139 mEq/L (ref 135–145)
Total Protein: 7.1 g/dL (ref 6.0–8.3)

## 2018-06-23 LAB — CBC WITH DIFFERENTIAL/PLATELET
BASOS PCT: 0.7 % (ref 0.0–3.0)
Basophils Absolute: 0.1 10*3/uL (ref 0.0–0.1)
EOS PCT: 1.7 % (ref 0.0–5.0)
Eosinophils Absolute: 0.1 10*3/uL (ref 0.0–0.7)
HEMATOCRIT: 45.2 % (ref 36.0–46.0)
Hemoglobin: 15.4 g/dL — ABNORMAL HIGH (ref 12.0–15.0)
LYMPHS ABS: 2.2 10*3/uL (ref 0.7–4.0)
LYMPHS PCT: 27.5 % (ref 12.0–46.0)
MCHC: 34.1 g/dL (ref 30.0–36.0)
MCV: 88.2 fl (ref 78.0–100.0)
MONOS PCT: 6.3 % (ref 3.0–12.0)
Monocytes Absolute: 0.5 10*3/uL (ref 0.1–1.0)
NEUTROS ABS: 5 10*3/uL (ref 1.4–7.7)
NEUTROS PCT: 63.8 % (ref 43.0–77.0)
PLATELETS: 251 10*3/uL (ref 150.0–400.0)
RBC: 5.13 Mil/uL — ABNORMAL HIGH (ref 3.87–5.11)
RDW: 13.2 % (ref 11.5–15.5)
WBC: 7.8 10*3/uL (ref 4.0–10.5)

## 2018-06-23 LAB — LIPID PANEL
Cholesterol: 149 mg/dL (ref 0–200)
HDL: 59.7 mg/dL (ref >39.00)
LDL Cholesterol: 69 mg/dL (ref 0–99)
NonHDL: 89.62
Total CHOL/HDL Ratio: 3
Triglycerides: 104 mg/dL (ref 0.0–149.0)
VLDL: 20.8 mg/dL (ref 0.0–40.0)

## 2018-06-23 MED ORDER — DILTIAZEM HCL ER COATED BEADS 300 MG PO CP24: ORAL_CAPSULE | ORAL | 3 refills | Status: DC

## 2018-06-23 MED ORDER — LISINOPRIL 30 MG PO TABS: 30.0000 mg | ORAL_TABLET | Freq: Every day | ORAL | 3 refills | Status: DC

## 2018-06-23 MED ORDER — PRAVASTATIN SODIUM 40 MG PO TABS: 40.0000 mg | ORAL_TABLET | Freq: Every day | ORAL | 1 refills | Status: DC

## 2018-06-23 MED ORDER — TRIAMTERENE-HCTZ 37.5-25 MG PO CAPS: 1.0000 | ORAL_CAPSULE | Freq: Every day | ORAL | 3 refills | Status: DC

## 2018-06-23 MED ORDER — CITALOPRAM HYDROBROMIDE 10 MG PO TABS: 10.0000 mg | ORAL_TABLET | Freq: Every day | ORAL | 3 refills | Status: DC

## 2018-06-23 MED ORDER — MELOXICAM 15 MG PO TABS: 15.0000 mg | ORAL_TABLET | Freq: Every day | ORAL | 3 refills | Status: DC

## 2018-06-23 NOTE — Progress Notes (Signed)
Subjective:     Kimberly Edwards is a 77 y.o. female and is here for a comprehensive physical exam. The patient reports problems - worsening arthritis .  The otc meds are not working.  She is thinking about tryin cbd cream/ oil.  Many of her friends are taking it .    Social History   Socioeconomic History  . Marital status: Widowed    Spouse name: Not on file  . Number of children: Not on file  . Years of education: Not on file  . Highest education level: Not on file  Occupational History  . Occupation: retired Theme park manager  Social Needs  . Financial resource strain: Not on file  . Food insecurity:    Worry: Not on file    Inability: Not on file  . Transportation needs:    Medical: Not on file    Non-medical: Not on file  Tobacco Use  . Smoking status: Former Smoker    Packs/day: 0.10    Years: 50.00    Pack years: 5.00    Types: Cigarettes    Last attempt to quit: 03/23/2012    Years since quitting: 6.2  . Smokeless tobacco: Never Used  . Tobacco comment: social and weekends alcohol  Substance and Sexual Activity  . Alcohol use: Yes    Comment: Wine on weekends  . Drug use: No  . Sexual activity: Not Currently    Partners: Male    Birth control/protection: Post-menopausal  Lifestyle  . Physical activity:    Days per week: Not on file    Minutes per session: Not on file  . Stress: Not on file  Relationships  . Social connections:    Talks on phone: Not on file    Gets together: Not on file    Attends religious service: Not on file    Active member of club or organization: Not on file    Attends meetings of clubs or organizations: Not on file    Relationship status: Not on file  . Intimate partner violence:    Fear of current or ex partner: Not on file    Emotionally abused: Not on file    Physically abused: Not on file    Forced sexual activity: Not on file  Other Topics Concern  . Not on file  Social History Narrative   Regular exercise: walk 2 times a week    Caffeine use: 2 cups of coffee daily         Health Maintenance  Topic Date Due  . TETANUS/TDAP  05/04/1958  . INFLUENZA VACCINE  05/14/2018  . MAMMOGRAM  04/09/2019  . DEXA SCAN  Completed  . PNA vac Low Risk Adult  Completed    The following portions of the patient's history were reviewed and updated as appropriate:  She  has a past medical history of Arthritis, Depression, Heart murmur, Hyperlipidemia, Hypertension, Knee joint replacement status, left (2003), PONV (postoperative nausea and vomiting), and Right knee DJD. She does not have any pertinent problems on file. She  has a past surgical history that includes Cholecystectomy (1998); Replacement total knee; Breast biopsy (2000); Tonsillectomy; Total knee arthroplasty (03/23/2012); and Joint replacement. Her family history includes Breast cancer in her sister; Hypertension in her father. She  reports that she quit smoking about 6 years ago. Her smoking use included cigarettes. She has a 5.00 pack-year smoking history. She has never used smokeless tobacco. She reports that she drinks alcohol. She reports that she does not use  drugs. She has a current medication list which includes the following prescription(s): amoxicillin, aspirin, citalopram, diclofenac sodium, diltiazem, fish oil-omega-3 fatty acids, glucosamine-chondroitin, lisinopril, lisinopril, meloxicam, multivitamin with minerals, polyethyl glycol-propyl glycol, pravastatin, and triamterene-hydrochlorothiazide. Current Outpatient Medications on File Prior to Visit  Medication Sig Dispense Refill  . amoxicillin (AMOXIL) 500 MG capsule TAKE 4 CAPSULES BY MOUTH BEFORE DENTAL PROCEDURES 30 capsule 0  . aspirin 81 MG tablet Take 81 mg by mouth daily.    . diclofenac sodium (VOLTAREN) 1 % GEL     . fish oil-omega-3 fatty acids 1000 MG capsule Take 3 g by mouth daily.    [name] Kitchen glucosamine-chondroitin 500-400 MG tablet Take 1 tablet by mouth daily.    [name] Kitchen lisinopril (PRINIVIL,ZESTRIL)  30 MG tablet TAKE 1 TABLET BY MOUTH EVERY DAY 90 tablet 0  . Multiple Vitamins-Minerals (MULTIVITAMIN WITH MINERALS) tablet Take 1 tablet by mouth daily.    Vladimir Faster Glycol-Propyl Glycol (SYSTANE) 0.4-0.3 % SOLN Apply 1 drop to eye 2 (two) times daily as needed.      No current facility-administered medications on file prior to visit.    She is allergic to oxycodone..  Review of Systems Review of Systems  Constitutional: Negative for activity change, appetite change and fatigue.  HENT: Negative for hearing loss, congestion, tinnitus and ear discharge.  dentist q24m Eyes: Negative for visual disturbance (see optho q1y -- vision corrected to 20/20 with glasses).  Respiratory: Negative for cough, chest tightness and shortness of breath.   Cardiovascular: Negative for chest pain, palpitations and leg swelling.  Gastrointestinal: Negative for abdominal pain, diarrhea, constipation and abdominal distention.  Genitourinary: Negative for urgency, frequency, decreased urine volume and difficulty urinating.  Musculoskeletal: Negative for back pain, arthralgias and gait problem.  Skin: Negative for color change, pallor and rash.  Neurological: Negative for dizziness, light-headedness, numbness and headaches.  Hematological: Negative for adenopathy. Does not bruise/bleed easily.  Psychiatric/Behavioral: Negative for suicidal ideas, confusion, sleep disturbance, self-injury, dysphoric mood, decreased concentration and agitation.       Objective:    BP 128/78 (BP Location: Left Arm, Patient Position: Sitting, Cuff Size: Normal)   Pulse 68   Temp 98 F (36.7 C) (Oral)   Resp 16   Ht 4\' 11"  (5.956 m)   Wt 147 lb (66.7 kg)   SpO2 98%   BMI 29.69 kg/m  General appearance: alert, cooperative, appears stated age and no distress Head: Normocephalic, without obvious abnormality, atraumatic Eyes: negative findings: lids and lashes normal, conjunctivae and sclerae normal and pupils equal, round,  reactive to light and accomodation Ears: normal TM's and external ear canals both ears Nose: Nares normal. Septum midline. Mucosa normal. No drainage or sinus tenderness. Throat: lips, mucosa, and tongue normal; teeth and gums normal Neck: no adenopathy, no carotid bruit, no JVD, supple, symmetrical, trachea midline and thyroid not enlarged, symmetric, no tenderness/mass/nodules Back: symmetric, no curvature. ROM normal. No CVA tenderness. Lungs: clear to auscultation bilaterally Breasts: normal appearance, no masses or tenderness Heart: regular rate and rhythm, S1, S2 normal, no murmur, click, rub or gallop Abdomen: soft, non-tender; bowel sounds normal; no masses,  no organomegaly Pelvic: not indicated; post-menopausal, no abnormal Pap smears in past Extremities: extremities normal, atraumatic, no cyanosis or edema Pulses: 2+ and symmetric Skin: Skin color, texture, turgor normal. No rashes or lesions Lymph nodes: Cervical, supraclavicular, and axillary nodes normal. Neurologic: Alert and oriented X 3, normal strength and tone. Normal symmetric reflexes. Normal coordination and gait    Assessment:  Healthy female exam.      Plan:    ghm utd  Check labs  See After Visit Summary for Counseling Recommendations    1. Need for influenza vaccination  - Flu vaccine HIGH DOSE PF (Fluzone High dose)  2. Essential hypertension Well controlled, no changes to meds. Encouraged heart healthy diet such as the DASH diet and exercise as tolerated.  - triamterene-hydrochlorothiazide (DYAZIDE) 37.5-25 MG capsule; Take 1 each (1 capsule total) by mouth daily.  Dispense: 90 capsule; Refill: 3 - lisinopril (PRINIVIL,ZESTRIL) 30 MG tablet; Take 1 tablet (30 mg total) by mouth daily.  Dispense: 90 tablet; Refill: 3 - diltiazem (CARDIZEM CD) 300 MG 24 hr capsule; TAKE 1 CAPSULE BY MOUTH EVERY DAY  Dispense: 90 capsule; Refill: 3 - CBC with Differential/Platelet - Lipid panel - Comprehensive  metabolic panel  3. Hyperlipidemia Tolerating statin, encouraged heart healthy diet, avoid trans fats, minimize simple carbs and saturated fats. Increase exercise as tolerated - pravastatin (PRAVACHOL) 40 MG tablet; Take 1 tablet (40 mg total) by mouth daily.  Dispense: 90 tablet; Refill: 1 - CBC with Differential/Platelet - Lipid panel - Comprehensive metabolic panel  4. Arthritis Refill mobic Pt would like to hold off on rheum referral She is going to try cbd cream  - meloxicam (MOBIC) 15 MG tablet; Take 1 tablet (15 mg total) by mouth daily.  Dispense: 90 tablet; Refill: 3  5. Generalized anxiety disorder Stable Refill meds - citalopram (CELEXA) 10 MG tablet; Take 1 tablet (10 mg total) by mouth daily.  Dispense: 90 tablet; Refill: 3  6. Preventative health care See above

## 2018-06-23 NOTE — Patient Instructions (Signed)
Preventive Care 79 Years and Older, Female Preventive care refers to lifestyle choices and visits with your health care provider that can promote health and wellness. What does preventive care include?  A yearly physical exam. This is also called an annual well check.  Dental exams once or twice a year.  Routine eye exams. Ask your health care provider how often you should have your eyes checked.  Personal lifestyle choices, including: ? Daily care of your teeth and gums. ? Regular physical activity. ? Eating a healthy diet. ? Avoiding tobacco and drug use. ? Limiting alcohol use. ? Practicing safe sex. ? Taking low-dose aspirin every day. ? Taking vitamin and mineral supplements as recommended by your health care provider. What happens during an annual well check? The services and screenings done by your health care provider during your annual well check will depend on your age, overall health, lifestyle risk factors, and family history of disease. Counseling Your health care provider may ask you questions about your:  Alcohol use.  Tobacco use.  Drug use.  Emotional well-being.  Home and relationship well-being.  Sexual activity.  Eating habits.  History of falls.  Memory and ability to understand (cognition).  Work and work environment.  Reproductive health.  Screening You may have the following tests or measurements:  Height, weight, and BMI.  Blood pressure.  Lipid and cholesterol levels. These may be checked every 5 years, or more frequently if you are over 50 years old.  Skin check.  Lung cancer screening. You may have this screening every year starting at age 55 if you have a 30-pack-year history of smoking and currently smoke or have quit within the past 15 years.  Fecal occult blood test (FOBT) of the stool. You may have this test every year starting at age 50.  Flexible sigmoidoscopy or colonoscopy. You may have a sigmoidoscopy every 5 years or  a colonoscopy every 10 years starting at age 50.  Hepatitis C blood test.  Hepatitis B blood test.  Sexually transmitted disease (STD) testing.  Diabetes screening. This is done by checking your blood sugar (glucose) after you have not eaten for a while (fasting). You may have this done every 1-3 years.  Bone density scan. This is done to screen for osteoporosis. You may have this done starting at age 65.  Mammogram. This may be done every 1-2 years. Talk to your health care provider about how often you should have regular mammograms.  Talk with your health care provider about your test results, treatment options, and if necessary, the need for more tests. Vaccines Your health care provider may recommend certain vaccines, such as:  Influenza vaccine. This is recommended every year.  Tetanus, diphtheria, and acellular pertussis (Tdap, Td) vaccine. You may need a Td booster every 10 years.  Varicella vaccine. You may need this if you have not been vaccinated.  Zoster vaccine. You may need this after age 60.  Measles, mumps, and rubella (MMR) vaccine. You may need at least one dose of MMR if you were born in 1957 or later. You may also need a second dose.  Pneumococcal 13-valent conjugate (PCV13) vaccine. One dose is recommended after age 65.  Pneumococcal polysaccharide (PPSV23) vaccine. One dose is recommended after age 65.  Meningococcal vaccine. You may need this if you have certain conditions.  Hepatitis A vaccine. You may need this if you have certain conditions or if you travel or work in places where you may be exposed to hepatitis   A.  Hepatitis B vaccine. You may need this if you have certain conditions or if you travel or work in places where you may be exposed to hepatitis B.  Haemophilus influenzae type b (Hib) vaccine. You may need this if you have certain conditions.  Talk to your health care provider about which screenings and vaccines you need and how often you  need them. This information is not intended to replace advice given to you by your health care provider. Make sure you discuss any questions you have with your health care provider. Document Released: 10/27/2015 Document Revised: 06/19/2016 Document Reviewed: 08/01/2015 Elsevier Interactive Patient Education  [employer].

## 2018-10-14 ENCOUNTER — Other Ambulatory Visit: Payer: Self-pay | Admitting: Family Medicine

## 2018-10-14 DIAGNOSIS — Z96653 Presence of artificial knee joint, bilateral: Secondary | ICD-10-CM

## 2018-10-15 ENCOUNTER — Other Ambulatory Visit: Payer: Self-pay | Admitting: Family Medicine

## 2018-10-15 DIAGNOSIS — Z96653 Presence of artificial knee joint, bilateral: Secondary | ICD-10-CM

## 2018-10-15 MED ORDER — AMOXICILLIN 500 MG PO CAPS: ORAL_CAPSULE | ORAL | 0 refills | Status: DC

## 2018-12-22 ENCOUNTER — Encounter: Payer: Self-pay | Admitting: Family Medicine

## 2018-12-22 ENCOUNTER — Ambulatory Visit (INDEPENDENT_AMBULATORY_CARE_PROVIDER_SITE_OTHER): Payer: PPO | Admitting: Family Medicine

## 2018-12-22 VITALS — BP 138/82 | HR 63 | Temp 98.1°F | Resp 12 | Ht 59.0 in | Wt 150.0 lb

## 2018-12-22 DIAGNOSIS — F411 Generalized anxiety disorder: Secondary | ICD-10-CM

## 2018-12-22 DIAGNOSIS — R5383 Other fatigue: Secondary | ICD-10-CM

## 2018-12-22 DIAGNOSIS — E785 Hyperlipidemia, unspecified: Secondary | ICD-10-CM

## 2018-12-22 DIAGNOSIS — M1990 Unspecified osteoarthritis, unspecified site: Secondary | ICD-10-CM

## 2018-12-22 DIAGNOSIS — R300 Dysuria: Secondary | ICD-10-CM | POA: Diagnosis not present

## 2018-12-22 DIAGNOSIS — Z96653 Presence of artificial knee joint, bilateral: Secondary | ICD-10-CM

## 2018-12-22 DIAGNOSIS — E782 Mixed hyperlipidemia: Secondary | ICD-10-CM

## 2018-12-22 DIAGNOSIS — I10 Essential (primary) hypertension: Secondary | ICD-10-CM | POA: Diagnosis not present

## 2018-12-22 LAB — CBC WITH DIFFERENTIAL/PLATELET
Basophils Absolute: 0.1 10*3/uL (ref 0.0–0.1)
Basophils Relative: 0.6 % (ref 0.0–3.0)
Eosinophils Absolute: 0.1 10*3/uL (ref 0.0–0.7)
Eosinophils Relative: 1.4 % (ref 0.0–5.0)
HCT: 45.6 % (ref 36.0–46.0)
Hemoglobin: 15.5 g/dL — ABNORMAL HIGH (ref 12.0–15.0)
LYMPHS ABS: 2.2 10*3/uL (ref 0.7–4.0)
Lymphocytes Relative: 22.8 % (ref 12.0–46.0)
MCHC: 33.9 g/dL (ref 30.0–36.0)
MCV: 89.8 fl (ref 78.0–100.0)
Monocytes Absolute: 0.6 10*3/uL (ref 0.1–1.0)
Monocytes Relative: 6.5 % (ref 3.0–12.0)
Neutro Abs: 6.8 10*3/uL (ref 1.4–7.7)
Neutrophils Relative %: 68.7 % (ref 43.0–77.0)
Platelets: 293 10*3/uL (ref 150.0–400.0)
RBC: 5.08 Mil/uL (ref 3.87–5.11)
RDW: 13.1 % (ref 11.5–15.5)
WBC: 9.8 10*3/uL (ref 4.0–10.5)

## 2018-12-22 LAB — POC URINALSYSI DIPSTICK (AUTOMATED)
Bilirubin, UA: NEGATIVE
Blood, UA: NEGATIVE
Glucose, UA: NEGATIVE
Ketones, UA: NEGATIVE
NITRITE UA: NEGATIVE
Protein, UA: POSITIVE — AB
Spec Grav, UA: 1.015 (ref 1.010–1.025)
UROBILINOGEN UA: 0.2 U/dL
pH, UA: 6 (ref 5.0–8.0)

## 2018-12-22 LAB — COMPREHENSIVE METABOLIC PANEL
ALT: 14 U/L (ref 0–35)
AST: 19 U/L (ref 0–37)
Albumin: 4.2 g/dL (ref 3.5–5.2)
Alkaline Phosphatase: 68 U/L (ref 39–117)
BILIRUBIN TOTAL: 0.7 mg/dL (ref 0.2–1.2)
BUN: 25 mg/dL — ABNORMAL HIGH (ref 6–23)
CO2: 30 mEq/L (ref 19–32)
Calcium: 9.9 mg/dL (ref 8.4–10.5)
Chloride: 102 mEq/L (ref 96–112)
Creatinine, Ser: 0.72 mg/dL (ref 0.40–1.20)
GFR: 78.01 mL/min (ref >60.00)
Glucose, Bld: 90 mg/dL (ref 70–99)
Potassium: 4.5 mEq/L (ref 3.5–5.1)
Sodium: 140 mEq/L (ref 135–145)
Total Protein: 7.1 g/dL (ref 6.0–8.3)

## 2018-12-22 LAB — LIPID PANEL
Cholesterol: 172 mg/dL (ref 0–200)
HDL: 61.6 mg/dL (ref >39.00)
LDL Cholesterol: 80 mg/dL (ref 0–99)
NonHDL: 109.97
Total CHOL/HDL Ratio: 3
Triglycerides: 149 mg/dL (ref 0.0–149.0)
VLDL: 29.8 mg/dL (ref 0.0–40.0)

## 2018-12-22 MED ORDER — LISINOPRIL 30 MG PO TABS: 30.0000 mg | ORAL_TABLET | Freq: Every day | ORAL | 1 refills | Status: DC

## 2018-12-22 MED ORDER — TRIAMTERENE-HCTZ 37.5-25 MG PO CAPS: 1.0000 | ORAL_CAPSULE | Freq: Every day | ORAL | 3 refills | Status: DC

## 2018-12-22 MED ORDER — CITALOPRAM HYDROBROMIDE 10 MG PO TABS: 10.0000 mg | ORAL_TABLET | Freq: Every day | ORAL | 3 refills | Status: DC

## 2018-12-22 MED ORDER — MELOXICAM 15 MG PO TABS: 15.0000 mg | ORAL_TABLET | Freq: Every day | ORAL | 3 refills | Status: DC

## 2018-12-22 MED ORDER — DILTIAZEM HCL ER COATED BEADS 300 MG PO CP24: ORAL_CAPSULE | ORAL | 3 refills | Status: DC

## 2018-12-22 MED ORDER — AMOXICILLIN 500 MG PO CAPS: ORAL_CAPSULE | ORAL | 0 refills | Status: DC

## 2018-12-22 MED ORDER — PRAVASTATIN SODIUM 40 MG PO TABS: 40.0000 mg | ORAL_TABLET | Freq: Every day | ORAL | 1 refills | Status: DC

## 2018-12-22 NOTE — Assessment & Plan Note (Signed)
Check labs and urine  

## 2018-12-22 NOTE — Assessment & Plan Note (Signed)
Well controlled, no changes to meds. Encouraged heart healthy diet such as the DASH diet and exercise as tolerated.  °

## 2018-12-22 NOTE — Patient Instructions (Signed)

## 2018-12-22 NOTE — Progress Notes (Signed)
Patient ID: Kimberly Edwards, female    DOB: 10/31/1940  Age: 78 y.o. MRN: [id number]    Subjective:  Subjective  HPI Kimberly Edwards presents for f/u bp and cholesterol   Kimberly Edwards also needs a refill on her abx for dental procedure   Review of Systems  Constitutional: Negative for appetite change, diaphoresis, fatigue and unexpected weight change.  Eyes: Negative for pain, redness and visual disturbance.  Respiratory: Negative for cough, chest tightness, shortness of breath and wheezing.   Cardiovascular: Negative for chest pain, palpitations and leg swelling.  Endocrine: Negative for cold intolerance, heat intolerance, polydipsia, polyphagia and polyuria.  Genitourinary: Negative for difficulty urinating, dysuria and frequency.  Neurological: Negative for dizziness, light-headedness, numbness and headaches.    History Past Medical History:  Diagnosis Date  . Arthritis   . Depression   . Heart murmur   . Hyperlipidemia   . Hypertension   . Knee joint replacement status, left 2003  . PONV (postoperative nausea and vomiting)   . Right knee DJD     Kimberly Edwards has a past surgical history that includes Cholecystectomy (1998); Replacement total knee; Breast biopsy (2000); Tonsillectomy; Total knee arthroplasty (03/23/2012); and Joint replacement.   Her family history includes Breast cancer in her sister; Hypertension in her father.Kimberly Edwards reports that Kimberly Edwards quit smoking about 6 years ago. Her smoking use included cigarettes. Kimberly Edwards has a 5.00 pack-year smoking history. Kimberly Edwards has never used smokeless tobacco. Kimberly Edwards reports current alcohol use. Kimberly Edwards reports that Kimberly Edwards does not use drugs.  Current Outpatient Medications on File Prior to Visit  Medication Sig Dispense Refill  . aspirin 81 MG tablet Take 81 mg by mouth daily.    . fish oil-omega-3 fatty acids 1000 MG capsule Take 3 g by mouth daily.    [name] Kitchen glucosamine-chondroitin 500-400 MG tablet Take 1 tablet by mouth daily.    . Multiple Vitamins-Minerals  (MULTIVITAMIN WITH MINERALS) tablet Take 1 tablet by mouth daily.    Kimberly Edwards Glycol-Propyl Glycol (SYSTANE) 0.4-0.3 % SOLN Apply 1 drop to eye 2 (two) times daily as needed.      No current facility-administered medications on file prior to visit.      Objective:  Objective  Physical Exam Vitals signs and nursing note reviewed.  Constitutional:      Appearance: Kimberly Edwards is well-developed.  HENT:     Head: Normocephalic and atraumatic.  Eyes:     Conjunctiva/sclera: Conjunctivae normal.  Neck:     Musculoskeletal: Normal range of motion and neck supple.     Thyroid: No thyromegaly.     Vascular: No carotid bruit or JVD.  Cardiovascular:     Rate and Rhythm: Normal rate and regular rhythm.     Heart sounds: Normal heart sounds. No murmur.  Pulmonary:     Effort: Pulmonary effort is normal. No respiratory distress.     Breath sounds: Normal breath sounds. No wheezing or rales.  Chest:     Chest wall: No tenderness.  Neurological:     Mental Status: Kimberly Edwards is alert and oriented to person, place, and time.    BP 138/82 (BP Location: Left Arm, Patient Position: Sitting, Cuff Size: Normal)   Pulse 63   Temp 98.1 F (36.7 C)   Resp 12   Ht 4\' 11"  (1.499 m)   Wt 150 lb (68 kg)   SpO2 96%   BMI 30.30 kg/m  Wt Readings from Last 3 Encounters:  12/22/18 150 lb (68 kg)  06/23/18 147 lb (66.7 kg)  12/16/17 145 lb 9.6 oz (66 kg)     Lab Results  Component Value Date   WBC 7.8 06/23/2018   HGB 15.4 (H) 06/23/2018   HCT 45.2 06/23/2018   PLT 251.0 06/23/2018   GLUCOSE 91 06/23/2018   CHOL 149 06/23/2018   TRIG 104.0 06/23/2018   HDL 59.70 06/23/2018   LDLDIRECT 121.0 03/30/2015   LDLCALC 69 06/23/2018   ALT 14 06/23/2018   AST 19 06/23/2018   NA 139 06/23/2018   K 4.7 06/23/2018   CL 103 06/23/2018   CREATININE 0.72 06/23/2018   BUN 23 06/23/2018   CO2 31 06/23/2018   TSH 1.88 09/27/2014   INR 0.98 03/17/2012   HGBA1C 5.6 06/08/2012    Mm 3d Screen Breast  Bilateral  Result Date: 04/08/2018 CLINICAL DATA:  Screening. EXAM: DIGITAL SCREENING BILATERAL MAMMOGRAM WITH TOMO AND CAD COMPARISON:  Previous exam(s). ACR Breast Density Category b: There are scattered areas of fibroglandular density. FINDINGS: There are no findings suspicious for malignancy. Images were processed with CAD. IMPRESSION: No mammographic evidence of malignancy. A result letter of this screening mammogram will be mailed directly to the patient. RECOMMENDATION: Screening mammogram in one year. (Code:SM-B-01Y) BI-RADS CATEGORY  1: Negative. Electronically Signed   By: Abelardo Diesel M.D.   On: 04/08/2018 14:20     Assessment & Plan:  Plan  I have discontinued Kimberly Edwards's diclofenac sodium and lisinopril. I have also changed her lisinopril. Additionally, I am having her maintain her multivitamin with minerals, aspirin, fish oil-omega-3 fatty acids, glucosamine-chondroitin, Polyethyl Glycol-Propyl Glycol, amoxicillin, triamterene-hydrochlorothiazide, pravastatin, meloxicam, diltiazem, and citalopram.  Meds ordered this encounter  Medications  . amoxicillin (AMOXIL) 500 MG capsule    Sig: TAKE 4 CAPSULES BY MOUTH BEFORE DENTAL PROCEDURES    Dispense:  30 capsule    Refill:  0  . triamterene-hydrochlorothiazide (DYAZIDE) 37.5-25 MG capsule    Sig: Take 1 each (1 capsule total) by mouth daily.    Dispense:  90 capsule    Refill:  3  . pravastatin (PRAVACHOL) 40 MG tablet    Sig: Take 1 tablet (40 mg total) by mouth daily.    Dispense:  90 tablet    Refill:  1  . meloxicam (MOBIC) 15 MG tablet    Sig: Take 1 tablet (15 mg total) by mouth daily.    Dispense:  90 tablet    Refill:  3  . lisinopril (PRINIVIL,ZESTRIL) 30 MG tablet    Sig: Take 1 tablet (30 mg total) by mouth daily.    Dispense:  90 tablet    Refill:  1  . diltiazem (CARDIZEM CD) 300 MG 24 hr capsule    Sig: TAKE 1 CAPSULE BY MOUTH EVERY DAY    Dispense:  90 capsule    Refill:  3  . citalopram (CELEXA) 10  MG tablet    Sig: Take 1 tablet (10 mg total) by mouth daily.    Dispense:  90 tablet    Refill:  3    Problem List Items Addressed This Visit      Unprioritized   Arthritis    Pt using cbc oil with good results       Relevant Medications   meloxicam (MOBIC) 15 MG tablet   Fatigue    Check labs and urine       Relevant Orders   CBC with Differential/Platelet   POCT Urinalysis Dipstick (Automated)   Generalized anxiety disorder    Stable con't meds  Relevant Medications   citalopram (CELEXA) 10 MG tablet   HTN (hypertension) - Primary    Well controlled, no changes to meds. Encouraged heart healthy diet such as the DASH diet and exercise as tolerated.       Relevant Medications   triamterene-hydrochlorothiazide (DYAZIDE) 37.5-25 MG capsule   pravastatin (PRAVACHOL) 40 MG tablet   lisinopril (PRINIVIL,ZESTRIL) 30 MG tablet   diltiazem (CARDIZEM CD) 300 MG 24 hr capsule   Other Relevant Orders   Lipid panel   Comprehensive metabolic panel   Hyperlipidemia    Encouraged heart healthy diet, increase exercise, avoid trans fats, consider a krill oil cap daily      Relevant Medications   triamterene-hydrochlorothiazide (DYAZIDE) 37.5-25 MG capsule   pravastatin (PRAVACHOL) 40 MG tablet   lisinopril (PRINIVIL,ZESTRIL) 30 MG tablet   diltiazem (CARDIZEM CD) 300 MG 24 hr capsule   Other Relevant Orders   Lipid panel   Comprehensive metabolic panel    Other Visit Diagnoses    Status post total bilateral knee replacement       Relevant Medications   amoxicillin (AMOXIL) 500 MG capsule      Follow-up: Return in about 6 months (around 06/24/2019), or if symptoms worsen or fail to improve, for hypertension, hyperlipidemia, annual exam, fasting.  Ann Held, DO

## 2018-12-22 NOTE — Assessment & Plan Note (Signed)
Encouraged heart healthy diet, increase exercise, avoid trans fats, consider a krill oil cap daily 

## 2018-12-22 NOTE — Assessment & Plan Note (Signed)
Stable con't meds 

## 2018-12-22 NOTE — Assessment & Plan Note (Signed)
Pt using cbc oil with good results

## 2018-12-23 LAB — URINE CULTURE
MICRO NUMBER:: 299543
SPECIMEN QUALITY:: ADEQUATE

## 2019-03-24 ENCOUNTER — Other Ambulatory Visit: Payer: Self-pay | Admitting: Family Medicine

## 2019-03-24 DIAGNOSIS — Z96653 Presence of artificial knee joint, bilateral: Secondary | ICD-10-CM

## 2019-03-24 MED ORDER — AMOXICILLIN 500 MG PO CAPS: ORAL_CAPSULE | ORAL | 0 refills | Status: DC

## 2019-03-25 DIAGNOSIS — D225 Melanocytic nevi of trunk: Secondary | ICD-10-CM | POA: Diagnosis not present

## 2019-03-25 DIAGNOSIS — C44319 Basal cell carcinoma of skin of other parts of face: Secondary | ICD-10-CM | POA: Diagnosis not present

## 2019-03-25 DIAGNOSIS — L219 Seborrheic dermatitis, unspecified: Secondary | ICD-10-CM | POA: Diagnosis not present

## 2019-03-25 DIAGNOSIS — D2222 Melanocytic nevi of left ear and external auricular canal: Secondary | ICD-10-CM | POA: Diagnosis not present

## 2019-03-25 DIAGNOSIS — L821 Other seborrheic keratosis: Secondary | ICD-10-CM | POA: Diagnosis not present

## 2019-03-25 DIAGNOSIS — Z86018 Personal history of other benign neoplasm: Secondary | ICD-10-CM | POA: Diagnosis not present

## 2019-03-25 DIAGNOSIS — D485 Neoplasm of uncertain behavior of skin: Secondary | ICD-10-CM | POA: Diagnosis not present

## 2019-03-25 DIAGNOSIS — C4441 Basal cell carcinoma of skin of scalp and neck: Secondary | ICD-10-CM | POA: Diagnosis not present

## 2019-04-29 DIAGNOSIS — C4441 Basal cell carcinoma of skin of scalp and neck: Secondary | ICD-10-CM | POA: Diagnosis not present

## 2019-05-27 ENCOUNTER — Other Ambulatory Visit: Payer: Self-pay | Admitting: Family Medicine

## 2019-05-27 DIAGNOSIS — Z1231 Encounter for screening mammogram for malignant neoplasm of breast: Secondary | ICD-10-CM

## 2019-06-22 ENCOUNTER — Other Ambulatory Visit: Payer: Self-pay

## 2019-06-22 ENCOUNTER — Ambulatory Visit (INDEPENDENT_AMBULATORY_CARE_PROVIDER_SITE_OTHER): Payer: PPO | Admitting: Family Medicine

## 2019-06-22 ENCOUNTER — Encounter: Payer: Self-pay | Admitting: Family Medicine

## 2019-06-22 VITALS — BP 128/80 | HR 62 | Temp 97.6°F | Resp 18 | Ht 59.0 in | Wt 151.5 lb

## 2019-06-22 DIAGNOSIS — H04123 Dry eye syndrome of bilateral lacrimal glands: Secondary | ICD-10-CM | POA: Diagnosis not present

## 2019-06-22 DIAGNOSIS — M1990 Unspecified osteoarthritis, unspecified site: Secondary | ICD-10-CM

## 2019-06-22 DIAGNOSIS — Z23 Encounter for immunization: Secondary | ICD-10-CM | POA: Diagnosis not present

## 2019-06-22 DIAGNOSIS — H524 Presbyopia: Secondary | ICD-10-CM | POA: Diagnosis not present

## 2019-06-22 DIAGNOSIS — E782 Mixed hyperlipidemia: Secondary | ICD-10-CM

## 2019-06-22 DIAGNOSIS — Z961 Presence of intraocular lens: Secondary | ICD-10-CM | POA: Diagnosis not present

## 2019-06-22 DIAGNOSIS — F411 Generalized anxiety disorder: Secondary | ICD-10-CM | POA: Diagnosis not present

## 2019-06-22 DIAGNOSIS — I10 Essential (primary) hypertension: Secondary | ICD-10-CM | POA: Diagnosis not present

## 2019-06-22 LAB — COMPREHENSIVE METABOLIC PANEL
ALT: 12 U/L (ref 0–35)
AST: 17 U/L (ref 0–37)
Albumin: 4.1 g/dL (ref 3.5–5.2)
Alkaline Phosphatase: 63 U/L (ref 39–117)
BUN: 25 mg/dL — ABNORMAL HIGH (ref 6–23)
CO2: 26 mEq/L (ref 19–32)
Calcium: 9.6 mg/dL (ref 8.4–10.5)
Chloride: 106 mEq/L (ref 96–112)
Creatinine, Ser: 0.68 mg/dL (ref 0.40–1.20)
GFR: 83.23 mL/min (ref >60.00)
Glucose, Bld: 90 mg/dL (ref 70–99)
Potassium: 4.1 mEq/L (ref 3.5–5.1)
Sodium: 140 mEq/L (ref 135–145)
Total Bilirubin: 0.6 mg/dL (ref 0.2–1.2)
Total Protein: 6.9 g/dL (ref 6.0–8.3)

## 2019-06-22 LAB — LIPID PANEL
Cholesterol: 162 mg/dL (ref 0–200)
HDL: 55 mg/dL (ref >39.00)
LDL Cholesterol: 87 mg/dL (ref 0–99)
NonHDL: 107.39
Total CHOL/HDL Ratio: 3
Triglycerides: 100 mg/dL (ref 0.0–149.0)
VLDL: 20 mg/dL (ref 0.0–40.0)

## 2019-06-22 MED ORDER — MELOXICAM 15 MG PO TABS: 15.0000 mg | ORAL_TABLET | Freq: Every day | ORAL | 3 refills | Status: DC

## 2019-06-22 MED ORDER — CITALOPRAM HYDROBROMIDE 10 MG PO TABS: 10.0000 mg | ORAL_TABLET | Freq: Every day | ORAL | 3 refills | Status: DC

## 2019-06-22 MED ORDER — TRIAMTERENE-HCTZ 37.5-25 MG PO CAPS: 1.0000 | ORAL_CAPSULE | Freq: Every day | ORAL | 3 refills | Status: DC

## 2019-06-22 MED ORDER — DILTIAZEM HCL ER COATED BEADS 300 MG PO CP24: ORAL_CAPSULE | ORAL | 3 refills | Status: DC

## 2019-06-22 MED ORDER — PRAVASTATIN SODIUM 40 MG PO TABS: 40.0000 mg | ORAL_TABLET | Freq: Every day | ORAL | 1 refills | Status: DC

## 2019-06-22 MED ORDER — LISINOPRIL 30 MG PO TABS: 30.0000 mg | ORAL_TABLET | Freq: Every day | ORAL | 1 refills | Status: DC

## 2019-06-22 NOTE — Assessment & Plan Note (Signed)
Stable con't celexa  

## 2019-06-22 NOTE — Patient Instructions (Signed)

## 2019-06-22 NOTE — Assessment & Plan Note (Signed)
Well controlled, no changes to meds. Encouraged heart healthy diet such as the DASH diet and exercise as tolerated.  °

## 2019-06-22 NOTE — Assessment & Plan Note (Signed)
Encouraged heart healthy diet, increase exercise, avoid trans fats, consider a krill oil cap daily 

## 2019-06-22 NOTE — Assessment & Plan Note (Signed)
Refill meloxicam Stable

## 2019-06-22 NOTE — Progress Notes (Signed)
Patient ID: Kimberly Edwards, female    DOB: [birth year]  Age: 78 y.o. MRN: [id number]    Subjective:  Subjective  HPI Kimberly Edwards presents for f/u bp and chol. She would also like her flu shot No complaints.   Review of Systems  Constitutional: Negative for appetite change, diaphoresis, fatigue and unexpected weight change.  Eyes: Negative for pain, redness and visual disturbance.  Respiratory: Negative for cough, chest tightness, shortness of breath and wheezing.   Cardiovascular: Negative for chest pain, palpitations and leg swelling.  Endocrine: Negative for cold intolerance, heat intolerance, polydipsia, polyphagia and polyuria.  Genitourinary: Negative for difficulty urinating, dysuria and frequency.  Neurological: Negative for dizziness, light-headedness, numbness and headaches.    History Past Medical History:  Diagnosis Date  . Arthritis   . Depression   . Heart murmur   . Hyperlipidemia   . Hypertension   . Knee joint replacement status, left 2003  . PONV (postoperative nausea and vomiting)   . Right knee DJD     She has a past surgical history that includes Cholecystectomy (1998); Replacement total knee; Breast biopsy (2000); Tonsillectomy; Total knee arthroplasty (03/23/2012); and Joint replacement.   Her family history includes Breast cancer in her sister; Hypertension in her father.She reports that she quit smoking about 7 years ago. Her smoking use included cigarettes. She has a 5.00 pack-year smoking history. She has never used smokeless tobacco. She reports current alcohol use. She reports that she does not use drugs.  Current Outpatient Medications on File Prior to Visit  Medication Sig Dispense Refill  . aspirin 81 MG tablet Take 81 mg by mouth daily.    . fish oil-omega-3 fatty acids 1000 MG capsule Take 3 g by mouth daily.    [name] Kitchen glucosamine-chondroitin 500-400 MG tablet Take 1 tablet by mouth daily.    . Multiple Vitamins-Minerals (MULTIVITAMIN WITH  MINERALS) tablet Take 1 tablet by mouth daily.    Vladimir Faster Glycol-Propyl Glycol (SYSTANE) 0.4-0.3 % SOLN Apply 1 drop to eye 2 (two) times daily as needed.     [name] Kitchen amoxicillin (AMOXIL) 500 MG capsule TAKE 4 CAPSULES BY MOUTH BEFORE DENTAL PROCEDURES (Patient not taking: Reported on 06/22/2019) 30 capsule 0   No current facility-administered medications on file prior to visit.      Objective:  Objective  Physical Exam Vitals signs and nursing note reviewed.  Constitutional:      Appearance: She is well-developed.  HENT:     Head: Normocephalic and atraumatic.  Eyes:     Conjunctiva/sclera: Conjunctivae normal.  Neck:     Musculoskeletal: Normal range of motion and neck supple.     Thyroid: No thyromegaly.     Vascular: No carotid bruit or JVD.  Cardiovascular:     Rate and Rhythm: Normal rate and regular rhythm.     Heart sounds: Normal heart sounds. No murmur.  Pulmonary:     Effort: Pulmonary effort is normal. No respiratory distress.     Breath sounds: Normal breath sounds. No wheezing or rales.  Chest:     Chest wall: No tenderness.  Neurological:     Mental Status: She is alert and oriented to person, place, and time.    BP 128/80 (BP Location: Right Arm, Patient Position: Sitting, Cuff Size: Normal)   Pulse 62   Temp 97.6 F (36.4 C) (Temporal)   Resp 18   Ht 4\' 11"  (1.499 m)   Wt 151 lb 8 oz (68.7 kg)   SpO2 97%  BMI 30.60 kg/m  Wt Readings from Last 3 Encounters:  06/22/19 151 lb 8 oz (68.7 kg)  12/22/18 150 lb (68 kg)  06/23/18 147 lb (66.7 kg)     Lab Results  Component Value Date   WBC 9.8 12/22/2018   HGB 15.5 (H) 12/22/2018   HCT 45.6 12/22/2018   PLT 293.0 12/22/2018   GLUCOSE 90 12/22/2018   CHOL 172 12/22/2018   TRIG 149.0 12/22/2018   HDL 61.60 12/22/2018   LDLDIRECT 121.0 03/30/2015   LDLCALC 80 12/22/2018   ALT 14 12/22/2018   AST 19 12/22/2018   NA 140 12/22/2018   K 4.5 12/22/2018   CL 102 12/22/2018   CREATININE 0.72 12/22/2018    BUN 25 (H) 12/22/2018   CO2 30 12/22/2018   TSH 1.88 09/27/2014   INR 0.98 03/17/2012   HGBA1C 5.6 06/08/2012    Mm 3d Screen Breast Bilateral  Result Date: 04/08/2018 CLINICAL DATA:  Screening. EXAM: DIGITAL SCREENING BILATERAL MAMMOGRAM WITH TOMO AND CAD COMPARISON:  Previous exam(s). ACR Breast Density Category b: There are scattered areas of fibroglandular density. FINDINGS: There are no findings suspicious for malignancy. Images were processed with CAD. IMPRESSION: No mammographic evidence of malignancy. A result letter of this screening mammogram will be mailed directly to the patient. RECOMMENDATION: Screening mammogram in one year. (Code:SM-B-01Y) BI-RADS CATEGORY  1: Negative. Electronically Signed   By: Abelardo Diesel M.D.   On: 04/08/2018 14:20     Assessment & Plan:  Plan  I have changed Kimberly Edwards's lisinopril. I am also having her maintain her multivitamin with minerals, aspirin, fish oil-omega-3 fatty acids, glucosamine-chondroitin, Polyethyl Glycol-Propyl Glycol, amoxicillin, triamterene-hydrochlorothiazide, pravastatin, meloxicam, diltiazem, and citalopram.  Meds ordered this encounter  Medications  . triamterene-hydrochlorothiazide (DYAZIDE) 37.5-25 MG capsule    Sig: Take 1 each (1 capsule total) by mouth daily.    Dispense:  90 capsule    Refill:  3  . pravastatin (PRAVACHOL) 40 MG tablet    Sig: Take 1 tablet (40 mg total) by mouth daily.    Dispense:  90 tablet    Refill:  1  . meloxicam (MOBIC) 15 MG tablet    Sig: Take 1 tablet (15 mg total) by mouth daily.    Dispense:  90 tablet    Refill:  3  . lisinopril (ZESTRIL) 30 MG tablet    Sig: Take 1 tablet (30 mg total) by mouth daily.    Dispense:  90 tablet    Refill:  1  . diltiazem (CARDIZEM CD) 300 MG 24 hr capsule    Sig: TAKE 1 CAPSULE BY MOUTH EVERY DAY    Dispense:  90 capsule    Refill:  3  . citalopram (CELEXA) 10 MG tablet    Sig: Take 1 tablet (10 mg total) by mouth daily.    Dispense:  90  tablet    Refill:  3    Problem List Items Addressed This Visit      Unprioritized   Arthritis    Refill meloxicam Stable       Relevant Medications   meloxicam (MOBIC) 15 MG tablet   Generalized anxiety disorder    Stable con't celexa       Relevant Medications   citalopram (CELEXA) 10 MG tablet   HTN (hypertension)    Well controlled, no changes to meds. Encouraged heart healthy diet such as the DASH diet and exercise as tolerated.        Relevant Medications   triamterene-hydrochlorothiazide (  DYAZIDE) 37.5-25 MG capsule   pravastatin (PRAVACHOL) 40 MG tablet   lisinopril (ZESTRIL) 30 MG tablet   diltiazem (CARDIZEM CD) 300 MG 24 hr capsule   Other Relevant Orders   Lipid panel   Comprehensive metabolic panel   Hyperlipidemia    Encouraged heart healthy diet, increase exercise, avoid trans fats, consider a krill oil cap daily       Relevant Medications   triamterene-hydrochlorothiazide (DYAZIDE) 37.5-25 MG capsule   pravastatin (PRAVACHOL) 40 MG tablet   lisinopril (ZESTRIL) 30 MG tablet   diltiazem (CARDIZEM CD) 300 MG 24 hr capsule   Other Relevant Orders   Lipid panel   Comprehensive metabolic panel    Other Visit Diagnoses    Need for influenza vaccination    -  Primary   Relevant Orders   Flu Vaccine QUAD High Dose(Fluad)      Follow-up: Return in about 6 months (around 12/20/2019), or if symptoms worsen or fail to improve, for annual exam, fasting.  Ann Held, DO

## 2019-07-01 ENCOUNTER — Other Ambulatory Visit: Payer: Self-pay

## 2019-07-01 ENCOUNTER — Ambulatory Visit
Admission: RE | Admit: 2019-07-01 | Discharge: 2019-07-01 | Disposition: A | Discharge status: Home / Self Care | Payer: PPO | Source: Ambulatory Visit | Attending: Family Medicine | Admitting: Family Medicine

## 2019-07-01 DIAGNOSIS — Z1231 Encounter for screening mammogram for malignant neoplasm of breast: Secondary | ICD-10-CM | POA: Diagnosis not present

## 2019-08-27 DIAGNOSIS — L570 Actinic keratosis: Secondary | ICD-10-CM | POA: Diagnosis not present

## 2019-08-27 DIAGNOSIS — Z23 Encounter for immunization: Secondary | ICD-10-CM | POA: Diagnosis not present

## 2019-08-27 DIAGNOSIS — Z85828 Personal history of other malignant neoplasm of skin: Secondary | ICD-10-CM | POA: Diagnosis not present

## 2019-10-18 ENCOUNTER — Encounter: Payer: Self-pay | Admitting: Family Medicine

## 2019-11-06 ENCOUNTER — Ambulatory Visit: Payer: PPO | Attending: Internal Medicine

## 2019-11-06 DIAGNOSIS — Z23 Encounter for immunization: Secondary | ICD-10-CM | POA: Insufficient documentation

## 2019-11-06 NOTE — Progress Notes (Signed)
   Covid-19 Vaccination Clinic  Name:  Kimberly Edwards    MRN: [id number] DOB: 1941/03/21  11/06/2019  [name] was observed post Covid-19 immunization for 15 minutes without incidence. She was provided with Vaccine Information Sheet and instruction to access the V-Safe system.   [name] was instructed to call 911 with any severe reactions post vaccine: [name] Kitchen Difficulty breathing  . Swelling of your face and throat  . A fast heartbeat  . A bad rash all over your body  . Dizziness and weakness    Immunizations Administered    Name Date Dose VIS Date Route   Pfizer COVID-19 Vaccine 11/06/2019 12:53 PM 0.3 mL 09/24/2019 Intramuscular   Manufacturer: [place]   Lot: [id number]   [name]: [id number]

## 2019-11-22 ENCOUNTER — Telehealth: Payer: Self-pay | Admitting: Family Medicine

## 2019-11-22 NOTE — Chronic Care Management (AMB) (Signed)
Chronic Care Management   Note  11/22/2019 Name: Cassara Nida MRN: [id number] DOB: [birth year]  Kimberlyann Hollar is a 79 y.o. year old female who is a primary care patient of Ann Held, DO. I reached out to [employer] by phone today in response to a referral sent by Ms. Chelcy Broz's PCP, Carollee Herter, Alferd Apa, DO.   Ms. Sherman was given information about Chronic Care Management services today including:  1. CCM service includes personalized support from designated clinical staff supervised by her physician, including individualized plan of care and coordination with other care providers 2. 24/7 contact phone numbers for assistance for urgent and routine care needs. 3. Service will only be billed when office clinical staff spend 20 minutes or more in a month to coordinate care. 4. Only one practitioner may furnish and bill the service in a calendar month. 5. The patient may stop CCM services at any time (effective at the end of the month) by phone call to the office staff. 6. The patient will be responsible for cost sharing (co-pay) of up to 20% of the service fee (after annual deductible is met).  Patient agreed to services and verbal consent obtained.   Follow up plan:   Raynicia Dukes [facility]

## 2019-11-24 ENCOUNTER — Other Ambulatory Visit: Payer: Self-pay

## 2019-11-24 ENCOUNTER — Ambulatory Visit: Payer: PPO | Admitting: Pharmacist

## 2019-11-24 DIAGNOSIS — F411 Generalized anxiety disorder: Secondary | ICD-10-CM

## 2019-11-24 DIAGNOSIS — M1990 Unspecified osteoarthritis, unspecified site: Secondary | ICD-10-CM

## 2019-11-24 DIAGNOSIS — E782 Mixed hyperlipidemia: Secondary | ICD-10-CM

## 2019-11-24 DIAGNOSIS — I10 Essential (primary) hypertension: Secondary | ICD-10-CM

## 2019-11-24 NOTE — Chronic Care Management (AMB) (Signed)
Chronic Care Management Pharmacy  Name: Kimberly Edwards  MRN: [id number] DOB: Nov 07, 1940   Chief Complaint/ HPI  Kimberly Edwards,  79 y.o. , female presents for their Initial CCM visit with the clinical pharmacist via telephone.  PCP : Ann Held, DO  Their chronic conditions include: HTN, HLD, GAD, Osteoarthritis  Office Visits: 06/22/19: Office visit w/ Dr. 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 Etter Sjogren for labwork  Medications: Outpatient Encounter Medications as of 11/24/2019  Medication Sig Note  . aspirin 81 MG tablet Take 81 mg by mouth daily.   . citalopram (CELEXA) 10 MG tablet Take 1 tablet (10 mg total) by mouth daily.   [name] Kitchen diltiazem (CARDIZEM CD) 300 MG 24 hr capsule TAKE 1 CAPSULE BY MOUTH EVERY Kimberly Edwards   . fish oil-omega-3 fatty acids 1000 MG capsule Take 2 g by mouth daily.    [name] Kitchen GLUCOSAMINE-CHONDROITIN PO Take 1 tablet by mouth daily. 1500mg  of glucosamine   . lisinopril (ZESTRIL) 30 MG tablet Take 1 tablet (30 mg total) by mouth daily.   . meloxicam (MOBIC) 15 MG tablet Take 1 tablet (15 mg total) by mouth daily.   . Multiple Vitamins-Minerals (MULTIVITAMIN WITH MINERALS) tablet Take 2 tablets by mouth daily. Vitafusion gummie   . Polyethyl Glycol-Propyl Glycol (SYSTANE) 0.4-0.3 % SOLN Apply 1 drop to eye daily.    . pravastatin (PRAVACHOL) 40 MG tablet Take 1 tablet (40 mg total) by mouth daily.   [name] Kitchen triamterene-hydrochlorothiazide (DYAZIDE) 37.5-25 MG capsule Take 1 each (1 capsule total) by mouth daily.   [name] Kitchen amoxicillin (AMOXIL) 500 MG capsule TAKE 4 CAPSULES BY MOUTH BEFORE DENTAL PROCEDURES (Patient not taking: Reported on 06/22/2019) 11/24/2019: Goes to dentist frequently due to gum problems. Last visit 2 months ago. Scheduling appt soon.   No facility-administered encounter medications on file as of  11/24/2019.     Current Diagnosis/Assessment:  Goals Addressed            This Visit's Progress   . Blood pressure goal <140/90      . Check blood pressure 2 to 3 times per week      . Pharmacy Care Plan       Current Barriers:  . Chronic Disease Management support, education, and care coordination needs related to HTN, HLD, GAD, Osteoarthritis  Pharmacist Clinical Goal(s):  . Blood Pressure Goal <140/90  Interventions: . Comprehensive medication review performed. . Check blood pressure 2-3 times per week  Patient Self Care Activities:  . Patient verbalizes understanding of plan to do as stated above, Self administers medications as prescribed, Calls pharmacy for medication refills, and Calls provider office for new concerns or questions  Initial goal documentation       Social Hx: Daughter and son. 5 grandchildren. 4 girls, 1 son. Husband passed 10 years ago. Lives near her daughter. Son lives in [place].  Uses a pill box 2 weeks at a time.   2 Vitafusions multivitamins Glucosamine 1500mg  ([employer]) CBD Oil 500mg  (H[employer] Extract Full Sprectrum) - Takes 50mg  (1 drop) AM and 50mg  (1 drop) PM  Mammogram in 07/02/2019. No malignancy Last DEXA 2013 Normal  Hypertension   CMP     Component Value Date/Time   NA 140 06/22/2019 1006   K 4.1 06/22/2019 1006   CL 106 06/22/2019 1006  CO2 26 06/22/2019 1006   GLUCOSE 90 06/22/2019 1006   BUN 25 (H) 06/22/2019 1006   CREATININE 0.68 06/22/2019 1006   CALCIUM 9.6 06/22/2019 1006   PROT 6.9 06/22/2019 1006   ALBUMIN 4.1 06/22/2019 1006   AST 17 06/22/2019 1006   ALT 12 06/22/2019 1006   ALKPHOS 63 06/22/2019 1006   BILITOT 0.6 06/22/2019 1006   GFRNONAA 59 (L) 03/25/2012 0500   GFRAA 69 (L) 03/25/2012 0500    BP today is: unable to assess due to phone visit  Office blood pressures are  BP Readings from Last 3 Encounters:  06/22/19 128/80  12/22/18 138/82  06/23/18 128/78    Patient has  failed these meds in the past: None noted Patient is currently controlled on the following medications: diltiazem 300mg  daily, lisinopril 30mg  daily, triamterene-hctz 37.5-25mg  daily  Does not check BP at home. Lost 40lbs over the last 3 years and BP improved so she really doesn't check anymore. Reports she has a very healthy diet.  Has a BP cuff, but has not used in a while   Patient checks BP at home infrequently   We discussed importance of BP control   Plan -Get new BP cuff and check BP 2-3 times per week  -Continue current medications     and  Hyperlipidemia   Lipid Panel     Component Value Date/Time   CHOL 162 06/22/2019 1006   TRIG 100.0 06/22/2019 1006   HDL 55.00 06/22/2019 1006   CHOLHDL 3 06/22/2019 1006   VLDL 20.0 06/22/2019 1006   [place] 87 06/22/2019 1006   LDLDIRECT 121.0 03/30/2015 1014   Patient has failed these meds in past: None noted Patient is currently controlled on the following medications: pravastatin 40mg  daily   Plan  Continue current medications  Generalized Anxiety Disorder    Patient has failed these meds in past: possibly benzo for acute anxiety when first experiencing grief.  Patient is currently controlled on the following medications: citalopram 10mg  daily  She was in a bad place when her husband passed. She feels very steady now and is not interested in further therapy. "I feel like I'm in a very good place right now"   Plan  Continue current medications   Osteoarthritis    Patient has failed these meds in past: celebrex (GI issues w/o pain relief) Patient is currently controlled on the following medications: meloxicam 15mg  daily, glucosamine 1500mg  daily, CBD Oil 50mg  BID  Minimal to no pain. Feels meloxicam and CBD oil are working well.  Plan  Continue current medications   Getting second COVID vaccine on Saturday  Verbal consent obtained for [facility] enhanced pharmacy services (medication  synchronization, adherence packaging, delivery coordination). A medication sync plan was created to allow patient to get all medications delivered once every 30 to 90 days per patient preference. Patient understands they have freedom to choose pharmacy and clinical pharmacist will coordinate care between all prescribers and [facility].

## 2019-11-25 ENCOUNTER — Other Ambulatory Visit: Payer: Self-pay

## 2019-11-25 DIAGNOSIS — E782 Mixed hyperlipidemia: Secondary | ICD-10-CM

## 2019-11-25 DIAGNOSIS — I10 Essential (primary) hypertension: Secondary | ICD-10-CM

## 2019-11-25 NOTE — Progress Notes (Signed)
CCM

## 2019-11-26 NOTE — Patient Instructions (Signed)
Visit Information  Goals Addressed            This Visit's Progress   . Blood pressure goal <140/90      . Check blood pressure 2 to 3 times per week      . Pharmacy Care Plan       Current Barriers:  . Chronic Disease Management support, education, and care coordination needs related to HTN, HLD, GAD, Osteoarthritis  Pharmacist Clinical Goal(s):  . Blood Pressure Goal <140/90  Interventions: . Comprehensive medication review performed. . Check blood pressure 2-3 times per week  Patient Self Care Activities:  . Patient verbalizes understanding of plan to do as stated above, Self administers medications as prescribed, Calls pharmacy for medication refills, and Calls provider office for new concerns or questions  Initial goal documentation        Kimberly Edwards was given information about Chronic Care Management services today including:  1. CCM service includes personalized support from designated clinical staff supervised by her physician, including individualized plan of care and coordination with other care providers 2. 24/7 contact phone numbers for assistance for urgent and routine care needs. 3. Service will only be billed when office clinical staff spend 20 minutes or more in a month to coordinate care. 4. Only one practitioner may furnish and bill the service in a calendar month. 5. The patient may stop CCM services at any time (effective at the end of the month) by phone call to the office staff. 6. The patient will be responsible for cost sharing (co-pay) of up to 20% of the service fee (after annual deductible is met).  Patient agreed to services and verbal consent obtained.   Print copy of patient instructions provided.  The pharmacy team will reach out to the patient again over the next 30 days.    Verbal consent obtained for [facility] enhanced pharmacy services (medication synchronization, adherence packaging, delivery coordination). A medication sync plan  was created to allow patient to get all medications delivered once every 30 to 90 days per patient preference. Patient understands they have freedom to choose pharmacy and clinical pharmacist will coordinate care between all prescribers and [facility].    De Blanch, PharmD Clinical Pharmacist [facility] [phone]   Blood Pressure Record Sheet To take your blood pressure, you will need a blood pressure machine. You can buy a blood pressure machine (blood pressure monitor) at your clinic, drug store, or online. When choosing one, consider:  An automatic monitor that has an arm cuff.  A cuff that wraps snugly around your upper arm. You should be able to fit only one finger between your arm and the cuff.  A device that stores blood pressure reading results.  Do not choose a monitor that measures your blood pressure from your wrist or finger. Follow your health care provider's instructions for how to take your blood pressure. To use this form:  Get one reading in the morning (a.m.) before you take any medicines.  Get one reading in the evening (p.m.) before supper.  Take at least 2 readings with each blood pressure check. This makes sure the results are correct. Wait 1-2 minutes between measurements.  Write down the results in the spaces on this form.  Repeat this once a week, or as told by your health care provider.  Make a follow-up appointment with your health care provider to discuss the results. Blood pressure log Date: _______________________  a.m. _____________________(1st reading)  _____________________(2nd reading)  p.m. _____________________(1st reading) _____________________(2nd reading) Date: _______________________  a.m. _____________________(1st reading) _____________________(2nd reading)  p.m. _____________________(1st reading) _____________________(2nd reading) Date: _______________________  a.m.  _____________________(1st reading) _____________________(2nd reading)  p.m. _____________________(1st reading) _____________________(2nd reading) Date: _______________________  a.m. _____________________(1st reading) _____________________(2nd reading)  p.m. _____________________(1st reading) _____________________(2nd reading) Date: _______________________  a.m. _____________________(1st reading) _____________________(2nd reading)  p.m. _____________________(1st reading) _____________________(2nd reading) This information is not intended to replace advice given to you by your health care provider. Make sure you discuss any questions you have with your health care provider. Document Revised: 11/28/2017 Document Reviewed: 09/30/2017 Elsevier Patient Education  [place].   How to Take Your Blood Pressure You can take your blood pressure at home with a machine. You may need to check your blood pressure at home:  To check if you have high blood pressure (hypertension).  To check your blood pressure over time.  To make sure your blood pressure medicine is working. Supplies needed: You will need a blood pressure machine, or monitor. You can buy one at a drugstore or online. When choosing one:  Choose one with an arm cuff.  Choose one that wraps around your upper arm. Only one finger should fit between your arm and the cuff.  Do not choose one that measures your blood pressure from your wrist or finger. Your doctor can suggest a monitor. How to prepare Avoid these things for 30 minutes before checking your blood pressure:  Drinking caffeine.  Drinking alcohol.  Eating.  Smoking.  Exercising. Five minutes before checking your blood pressure:  Pee.  Sit in a dining chair. Avoid sitting in a soft couch or armchair.  Be quiet. Do not talk. How to take your blood pressure Follow the instructions that came with your machine. If you have a digital blood pressure  monitor, these may be the instructions: 1. Sit up straight. 2. Place your feet on the floor. Do not cross your ankles or legs. 3. Rest your left arm at the level of your heart. You may rest it on a table, desk, or chair. 4. Pull up your shirt sleeve. 5. Wrap the blood pressure cuff around the upper part of your left arm. The cuff should be 1 inch (2.5 cm) above your elbow. It is best to wrap the cuff around bare skin. 6. Fit the cuff snugly around your arm. You should be able to place only one finger between the cuff and your arm. 7. Put the cord inside the groove of your elbow. 8. Press the power button. 9. Sit quietly while the cuff fills with air and loses air. 10. Write down the numbers on the screen. 11. Wait 2-3 minutes and then repeat steps 1-10. What do the numbers mean? Two numbers make up your blood pressure. The first number is called systolic pressure. The second is called diastolic pressure. An example of a blood pressure reading is "120 over 80" (or 120/80). If you are an adult and do not have a medical condition, use this guide to find out if your blood pressure is normal: Normal  First number: below 120.  Second number: below 80. Elevated  First number: 120-129.  Second number: below 80. Hypertension stage 1  First number: 130-139.  Second number: 80-89. Hypertension stage 2  First number: 140 or above.  Second number: 38 or above. Your blood pressure is above normal even if only the top or bottom number is above normal. Follow these instructions at home:  Check your blood  pressure as often as your doctor tells you to.  Take your monitor to your next doctor's appointment. Your doctor will: ? Make sure you are using it correctly. ? Make sure it is working right.  Make sure you understand what your blood pressure numbers should be.  Tell your doctor if your medicines are causing side effects. Contact a doctor if:  Your blood pressure keeps being  high. Get help right away if:  Your first blood pressure number is higher than 180.  Your second blood pressure number is higher than 120. This information is not intended to replace advice given to you by your health care provider. Make sure you discuss any questions you have with your health care provider. Document Revised: 09/12/2017 Document Reviewed: 03/08/2016 Elsevier Patient Education  2020 [employer].

## 2019-11-27 ENCOUNTER — Ambulatory Visit: Payer: PPO | Attending: Internal Medicine

## 2019-11-27 DIAGNOSIS — Z23 Encounter for immunization: Secondary | ICD-10-CM

## 2019-11-27 NOTE — Progress Notes (Signed)
   Covid-19 Vaccination Clinic  Name:  Kimberly Edwards    MRN: [id number] DOB: Apr 28, 1941  11/27/2019  [name] was observed post Covid-19 immunization for 15 minutes without incidence. She was provided with Vaccine Information Sheet and instruction to access the V-Safe system.   [name] was instructed to call 911 with any severe reactions post vaccine: [name] Kitchen Difficulty breathing  . Swelling of your face and throat  . A fast heartbeat  . A bad rash all over your body  . Dizziness and weakness    Immunizations Administered    Name Date Dose VIS Date Route   Pfizer COVID-19 Vaccine 11/27/2019 11:07 AM 0.3 mL 09/24/2019 Intramuscular   Manufacturer: [place]   Lot: [id number]   [place]: [id number]

## 2019-11-30 ENCOUNTER — Other Ambulatory Visit: Payer: Self-pay

## 2019-11-30 DIAGNOSIS — I10 Essential (primary) hypertension: Secondary | ICD-10-CM

## 2019-11-30 DIAGNOSIS — E782 Mixed hyperlipidemia: Secondary | ICD-10-CM

## 2019-11-30 DIAGNOSIS — M1990 Unspecified osteoarthritis, unspecified site: Secondary | ICD-10-CM

## 2019-11-30 DIAGNOSIS — F411 Generalized anxiety disorder: Secondary | ICD-10-CM

## 2019-11-30 MED ORDER — TRIAMTERENE-HCTZ 37.5-25 MG PO CAPS: 1.0000 | ORAL_CAPSULE | Freq: Every day | ORAL | 1 refills | Status: DC

## 2019-11-30 MED ORDER — MELOXICAM 15 MG PO TABS: 15.0000 mg | ORAL_TABLET | Freq: Every day | ORAL | 1 refills | Status: DC

## 2019-11-30 MED ORDER — PRAVASTATIN SODIUM 40 MG PO TABS: 40.0000 mg | ORAL_TABLET | Freq: Every day | ORAL | 1 refills | Status: DC

## 2019-11-30 MED ORDER — DILTIAZEM HCL ER COATED BEADS 300 MG PO CP24: ORAL_CAPSULE | ORAL | 1 refills | Status: DC

## 2019-11-30 MED ORDER — LISINOPRIL 30 MG PO TABS: 30.0000 mg | ORAL_TABLET | Freq: Every day | ORAL | 1 refills | Status: DC

## 2019-11-30 MED ORDER — CITALOPRAM HYDROBROMIDE 10 MG PO TABS: 10.0000 mg | ORAL_TABLET | Freq: Every day | ORAL | 3 refills | Status: DC

## 2019-12-14 DIAGNOSIS — M19071 Primary osteoarthritis, right ankle and foot: Secondary | ICD-10-CM | POA: Diagnosis not present

## 2019-12-17 ENCOUNTER — Other Ambulatory Visit: Payer: Self-pay | Admitting: Family Medicine

## 2019-12-17 DIAGNOSIS — Z96653 Presence of artificial knee joint, bilateral: Secondary | ICD-10-CM

## 2019-12-17 MED ORDER — AMOXICILLIN 500 MG PO CAPS: ORAL_CAPSULE | ORAL | 0 refills | Status: DC

## 2019-12-17 NOTE — Telephone Encounter (Signed)
Please advise 

## 2019-12-20 ENCOUNTER — Other Ambulatory Visit: Payer: Self-pay

## 2019-12-21 ENCOUNTER — Ambulatory Visit (INDEPENDENT_AMBULATORY_CARE_PROVIDER_SITE_OTHER): Payer: PPO | Admitting: Family Medicine

## 2019-12-21 ENCOUNTER — Other Ambulatory Visit: Payer: Self-pay

## 2019-12-21 ENCOUNTER — Encounter: Payer: Self-pay | Admitting: Family Medicine

## 2019-12-21 ENCOUNTER — Other Ambulatory Visit: Payer: Self-pay | Admitting: Family Medicine

## 2019-12-21 VITALS — BP 124/88 | HR 68 | Temp 97.0°F | Resp 18 | Ht 59.0 in | Wt 148.0 lb

## 2019-12-21 DIAGNOSIS — E785 Hyperlipidemia, unspecified: Secondary | ICD-10-CM

## 2019-12-21 DIAGNOSIS — M159 Polyosteoarthritis, unspecified: Secondary | ICD-10-CM | POA: Diagnosis not present

## 2019-12-21 DIAGNOSIS — I10 Essential (primary) hypertension: Secondary | ICD-10-CM

## 2019-12-21 DIAGNOSIS — Z0000 Encounter for general adult medical examination without abnormal findings: Secondary | ICD-10-CM | POA: Diagnosis not present

## 2019-12-21 LAB — LIPID PANEL
Cholesterol: 164 mg/dL (ref 0–200)
HDL: 51.1 mg/dL (ref >39.00)
LDL Cholesterol: 85 mg/dL (ref 0–99)
NonHDL: 112.46
Total CHOL/HDL Ratio: 3
Triglycerides: 137 mg/dL (ref 0.0–149.0)
VLDL: 27.4 mg/dL (ref 0.0–40.0)

## 2019-12-21 LAB — COMPREHENSIVE METABOLIC PANEL
ALT: 12 U/L (ref 0–35)
AST: 17 U/L (ref 0–37)
Albumin: 3.9 g/dL (ref 3.5–5.2)
Alkaline Phosphatase: 63 U/L (ref 39–117)
BUN: 31 mg/dL — ABNORMAL HIGH (ref 6–23)
CO2: 27 mEq/L (ref 19–32)
Calcium: 9.5 mg/dL (ref 8.4–10.5)
Chloride: 105 mEq/L (ref 96–112)
Creatinine, Ser: 0.78 mg/dL (ref 0.40–1.20)
GFR: 70.95 mL/min (ref >60.00)
Glucose, Bld: 92 mg/dL (ref 70–99)
Potassium: 4.2 mEq/L (ref 3.5–5.1)
Sodium: 138 mEq/L (ref 135–145)
Total Bilirubin: 0.6 mg/dL (ref 0.2–1.2)
Total Protein: 6.8 g/dL (ref 6.0–8.3)

## 2019-12-21 NOTE — Progress Notes (Signed)
Subjective:     Kimberly Edwards is a 79 y.o. female and is here for a comprehensive physical exam. The patient reports no problems.  Social History   Socioeconomic History  . Marital status: Widowed    Spouse name: Not on file  . Number of children: Not on file  . Years of education: Not on file  . Highest education level: Not on file  Occupational History  . Occupation: retired Theme park manager  Tobacco Use  . Smoking status: Former Smoker    Packs/day: 0.10    Years: 50.00    Pack years: 5.00    Types: Cigarettes    Quit date: 03/23/2012    Years since quitting: 7.7  . Smokeless tobacco: Never Used  . Tobacco comment: social and weekends alcohol  Substance and Sexual Activity  . Alcohol use: Yes    Comment: Wine on weekends  . Drug use: No  . Sexual activity: Not Currently    Partners: Male    Birth control/protection: Post-menopausal  Other Topics Concern  . Not on file  Social History Narrative   Regular exercise: walk 2 times a week   Caffeine use: 2 cups of coffee daily         Social Determinants of Health   Financial Resource Strain:   . Difficulty of Paying Living Expenses: Not on file  Food Insecurity:   . Worried About Charity fundraiser in the Last Year: Not on file  . Ran Out of Food in the Last Year: Not on file  Transportation Needs:   . Lack of Transportation (Medical): Not on file  . Lack of Transportation (Non-Medical): Not on file  Physical Activity:   . Days of Exercise per Week: Not on file  . Minutes of Exercise per Session: Not on file  Stress:   . Feeling of Stress : Not on file  Social Connections:   . Frequency of Communication with Friends and Family: Not on file  . Frequency of Social Gatherings with Friends and Family: Not on file  . Attends Religious Services: Not on file  . Active Member of Clubs or Organizations: Not on file  . Attends Archivist Meetings: Not on file  . Marital Status: Not on file  Intimate Partner  Violence:   . Fear of Current or Ex-Partner: Not on file  . Emotionally Abused: Not on file  . Physically Abused: Not on file  . Sexually Abused: Not on file   Health Maintenance  Topic Date Due  . TETANUS/TDAP  05/04/1958  . MAMMOGRAM  06/30/2020  . INFLUENZA VACCINE  Completed  . DEXA SCAN  Completed  . PNA vac Low Risk Adult  Completed    The following portions of the patient's history were reviewed and updated as appropriate:  She  has a past medical history of Arthritis, Depression, Heart murmur, Hyperlipidemia, Hypertension, Knee joint replacement status, left (2003), PONV (postoperative nausea and vomiting), and Right knee DJD. She does not have any pertinent problems on file. She  has a past surgical history that includes Cholecystectomy (1998); Replacement total knee; Breast biopsy (2000); Tonsillectomy; Total knee arthroplasty (03/23/2012); and Joint replacement. Her family history includes Breast cancer in her sister; Hypertension in her father. She  reports that she quit smoking about 7 years ago. Her smoking use included cigarettes. She has a 5.00 pack-year smoking history. She has never used smokeless tobacco. She reports current alcohol use. She reports that she does not use drugs. She has  a current medication list which includes the following prescription(s): amoxicillin, aspirin, citalopram, diltiazem, fish oil-omega-3 fatty acids, glucosamine-chondroitin, lisinopril, meloxicam, multivitamin with minerals, polyethyl glycol-propyl glycol, pravastatin, and triamterene-hydrochlorothiazide. Current Outpatient Medications on File Prior to Visit  Medication Sig Dispense Refill  . amoxicillin (AMOXIL) 500 MG capsule TAKE 4 CAPSULES BY MOUTH BEFORE DENTAL PROCEDURES 30 capsule 0  . aspirin 81 MG tablet Take 81 mg by mouth daily.    . citalopram (CELEXA) 10 MG tablet Take 1 tablet (10 mg total) by mouth daily. 90 tablet 3  . diltiazem (CARDIZEM CD) 300 MG 24 hr capsule TAKE 1  CAPSULE BY MOUTH EVERY DAY 90 capsule 1  . fish oil-omega-3 fatty acids 1000 MG capsule Take 2 g by mouth daily.     [name] Kitchen GLUCOSAMINE-CHONDROITIN PO Take 1 tablet by mouth daily. 1500mg  of glucosamine    . lisinopril (ZESTRIL) 30 MG tablet Take 1 tablet (30 mg total) by mouth daily. 90 tablet 1  . meloxicam (MOBIC) 15 MG tablet Take 1 tablet (15 mg total) by mouth daily. 90 tablet 1  . Multiple Vitamins-Minerals (MULTIVITAMIN WITH MINERALS) tablet Take 2 tablets by mouth daily. Vitafusion gummie    . Polyethyl Glycol-Propyl Glycol (SYSTANE) 0.4-0.3 % SOLN Apply 1 drop to eye daily.     . pravastatin (PRAVACHOL) 40 MG tablet Take 1 tablet (40 mg total) by mouth daily. 90 tablet 1  . triamterene-hydrochlorothiazide (DYAZIDE) 37.5-25 MG capsule Take 1 each (1 capsule total) by mouth daily. 90 capsule 1   No current facility-administered medications on file prior to visit.   She is allergic to oxycodone..  Review of Systems  Review of Systems  Constitutional: Negative for activity change, appetite change and fatigue.  HENT: Negative for hearing loss, congestion, tinnitus and ear discharge.   Eyes: Negative for visual disturbance (see optho q1y -- vision corrected to 20/20 with glasses).  Respiratory: Negative for cough, chest tightness and shortness of breath.   Cardiovascular: Negative for chest pain, palpitations and leg swelling.  Gastrointestinal: Negative for abdominal pain, diarrhea, constipation and abdominal distention.  Genitourinary: Negative for urgency, frequency, decreased urine volume and difficulty urinating.  Musculoskeletal: Negative for back pain, arthralgias and gait problem.  Skin: Negative for color change, pallor and rash.  Neurological: Negative for dizziness, light-headedness, numbness and headaches.  Hematological: Negative for adenopathy. Does not bruise/bleed easily.  Psychiatric/Behavioral: Negative for suicidal ideas, confusion, sleep disturbance, self-injury,  dysphoric mood, decreased concentration and agitation.  Pt is able to read and write and can do all ADLs No risk for falling No abuse/ violence in home     Objective:    BP 124/88 (BP Location: Left Arm, Patient Position: Sitting, Cuff Size: Normal)   Pulse 68   Temp (!) 97 F (36.1 C) (Temporal)   Resp 18   Ht 4\' 11"  (1.499 m)   Wt 148 lb (67.1 kg)   SpO2 98%   BMI 29.89 kg/m  General appearance: alert, cooperative, appears stated age and no distress Head: Normocephalic, without obvious abnormality, atraumatic Eyes: conjunctivae/corneas clear. PERRL, EOM's intact. Fundi benign. Ears: normal TM's and external ear canals both ears Neck: no adenopathy, no carotid bruit, no JVD, supple, symmetrical, trachea midline and thyroid not enlarged, symmetric, no tenderness/mass/nodules Back: symmetric, no curvature. ROM normal. No CVA tenderness. Lungs: clear to auscultation bilaterally Breasts: deferred Heart: regular rate and rhythm, S1, S2 normal, no murmur, click, rub or gallop Abdomen: soft, non-tender; bowel sounds normal; no masses,  no organomegaly Pelvic: not  indicated; post-menopausal, no abnormal Pap smears in past Extremities: extremities normal, atraumatic, no cyanosis or edema Pulses: 2+ and symmetric Skin: Skin color, texture, turgor normal. No rashes or lesions Lymph nodes: Cervical, supraclavicular, and axillary nodes normal. Neurologic: Alert and oriented X 3, normal strength and tone. Normal symmetric reflexes. Normal coordination and gait    Assessment:    Healthy female exam.      Plan:    ghm utd Check labs  See After Visit Summary for Counseling Recommendations    1. Hyperlipidemia, unspecified hyperlipidemia type Encouraged heart healthy diet, increase exercise, avoid trans fats, consider a krill oil cap daily - Lipid panel - Comprehensive metabolic panel  2. Essential hypertension Well controlled, no changes to meds. Encouraged heart healthy diet such  as the DASH diet and exercise as tolerated.  - Lipid panel - Comprehensive metabolic panel  3. Osteoarthritis of multiple joints, unspecified osteoarthritis type Per ortho  Using cbd oil and cream

## 2019-12-21 NOTE — Patient Instructions (Addendum)
Preventive Care 81 Years and Older, Female Preventive care refers to lifestyle choices and visits with your health care provider that can promote health and wellness. This includes:  A yearly physical exam. This is also called an annual well check.  Regular dental and eye exams.  Immunizations.  Screening for certain conditions.  Healthy lifestyle choices, such as diet and exercise. What can I expect for my preventive care visit? Physical exam Your health care provider will check:  Height and weight. These may be used to calculate body mass index (BMI), which is a measurement that tells if you are at a healthy weight.  Heart rate and blood pressure.  Your skin for abnormal spots. Counseling Your health care provider may ask you questions about:  Alcohol, tobacco, and drug use.  Emotional well-being.  Home and relationship well-being.  Sexual activity.  Eating habits.  History of falls.  Memory and ability to understand (cognition).  Work and work Statistician.  Pregnancy and menstrual history. What immunizations do I need?  Influenza (flu) vaccine  This is recommended every year. Tetanus, diphtheria, and pertussis (Tdap) vaccine  You may need a Td booster every 10 years. Varicella (chickenpox) vaccine  You may need this vaccine if you have not already been vaccinated. Zoster (shingles) vaccine  You may need this after age 81. Pneumococcal conjugate (PCV13) vaccine  One dose is recommended after age 81. Pneumococcal polysaccharide (PPSV23) vaccine  One dose is recommended after age 81. Measles, mumps, and rubella (MMR) vaccine  You may need at least one dose of MMR if you were born in 1957 or later. You may also need a second dose. Meningococcal conjugate (MenACWY) vaccine  You may need this if you have certain conditions. Hepatitis A vaccine  You may need this if you have certain conditions or if you travel or work in places where you may be exposed  to hepatitis A. Hepatitis B vaccine  You may need this if you have certain conditions or if you travel or work in places where you may be exposed to hepatitis B. Haemophilus influenzae type b (Hib) vaccine  You may need this if you have certain conditions. You may receive vaccines as individual doses or as more than one vaccine together in one shot (combination vaccines). Talk with your health care provider about the risks and benefits of combination vaccines. What tests do I need? Blood tests  Lipid and cholesterol levels. These may be checked every 5 years, or more frequently depending on your overall health.  Hepatitis C test.  Hepatitis B test. Screening  Lung cancer screening. You may have this screening every year starting at age 81 if you have a 30-pack-year history of smoking and currently smoke or have quit within the past 15 years.  Colorectal cancer screening. All adults should have this screening starting at age 36 and continuing until age 15. Your health care provider may recommend screening at age 23 if you are at increased risk. You will have tests every 1-10 years, depending on your results and the type of screening test.  Diabetes screening. This is done by checking your blood sugar (glucose) after you have not eaten for a while (fasting). You may have this done every 1-3 years.  Mammogram. This may be done every 1-2 years. Talk with your health care provider about how often you should have regular mammograms.  BRCA-related cancer screening. This may be done if you have a family history of breast, ovarian, tubal, or peritoneal cancers.  Other tests  Sexually transmitted disease (STD) testing.  Bone density scan. This is done to screen for osteoporosis. You may have this done starting at age 81. Follow these instructions at home: Eating and drinking  Eat a diet that includes fresh fruits and vegetables, whole grains, lean protein, and low-fat dairy products. Limit  your intake of foods with high amounts of sugar, saturated fats, and salt.  Take vitamin and mineral supplements as recommended by your health care provider.  Do not drink alcohol if your health care provider tells you not to drink.  If you drink alcohol: ? Limit how much you have to 0-1 drink a day. ? Be aware of how much alcohol is in your drink. In the [place] one drink equals one 12 oz bottle of beer (355 mL), one 5 oz glass of wine (148 mL), or one 1 oz glass of hard liquor (44 mL). Lifestyle  Take daily care of your teeth and gums.  Stay active. Exercise for at least 30 minutes on 5 or more days each week.  Do not use any products that contain nicotine or tobacco, such as cigarettes, e-cigarettes, and chewing tobacco. If you need help quitting, ask your health care provider.  If you are sexually active, practice safe sex. Use a condom or other form of protection in order to prevent STIs (sexually transmitted infections).  Talk with your health care provider about taking a low-dose aspirin or statin. What's next?  Go to your health care provider once a year for a well check visit.  Ask your health care provider how often you should have your eyes and teeth checked.  Stay up to date on all vaccines. This information is not intended to replace advice given to you by your health care provider. Make sure you discuss any questions you have with your health care provider. Document Revised: 09/24/2018 Document Reviewed: 09/24/2018 Elsevier Patient Education  2020 Elsevier Inc.  Preventive Care 81 Years and Older, Female Preventive care refers to lifestyle choices and visits with your health care provider that can promote health and wellness. This includes:  A yearly physical exam. This is also called an annual well check.  Regular dental and eye exams.  Immunizations.  Screening for certain conditions.  Healthy lifestyle choices, such as diet and exercise. What can I expect  for my preventive care visit? Physical exam Your health care provider will check:  Height and weight. These may be used to calculate body mass index (BMI), which is a measurement that tells if you are at a healthy weight.  Heart rate and blood pressure.  Your skin for abnormal spots. Counseling Your health care provider may ask you questions about:  Alcohol, tobacco, and drug use.  Emotional well-being.  Home and relationship well-being.  Sexual activity.  Eating habits.  History of falls.  Memory and ability to understand (cognition).  Work and work environment.  Pregnancy and menstrual history. What immunizations do I need?  Influenza (flu) vaccine  This is recommended every year. Tetanus, diphtheria, and pertussis (Tdap) vaccine  You may need a Td booster every 10 years. Varicella (chickenpox) vaccine  You may need this vaccine if you have not already been vaccinated. Zoster (shingles) vaccine  You may need this after age 60. Pneumococcal conjugate (PCV13) vaccine  One dose is recommended after age 65. Pneumococcal polysaccharide (PPSV23) vaccine  One dose is recommended after age 65. Measles, mumps, and rubella (MMR) vaccine  You may need at least one dose   of MMR if you were born in 1957 or later. You may also need a second dose. Meningococcal conjugate (MenACWY) vaccine  You may need this if you have certain conditions. Hepatitis A vaccine  You may need this if you have certain conditions or if you travel or work in places where you may be exposed to hepatitis A. Hepatitis B vaccine  You may need this if you have certain conditions or if you travel or work in places where you may be exposed to hepatitis B. Haemophilus influenzae type b (Hib) vaccine  You may need this if you have certain conditions. You may receive vaccines as individual doses or as more than one vaccine together in one shot (combination vaccines). Talk with your health care  provider about the risks and benefits of combination vaccines. What tests do I need? Blood tests  Lipid and cholesterol levels. These may be checked every 5 years, or more frequently depending on your overall health.  Hepatitis C test.  Hepatitis B test. Screening  Lung cancer screening. You may have this screening every year starting at age 55 if you have a 30-pack-year history of smoking and currently smoke or have quit within the past 15 years.  Colorectal cancer screening. All adults should have this screening starting at age 50 and continuing until age 75. Your health care provider may recommend screening at age 45 if you are at increased risk. You will have tests every 1-10 years, depending on your results and the type of screening test.  Diabetes screening. This is done by checking your blood sugar (glucose) after you have not eaten for a while (fasting). You may have this done every 1-3 years.  Mammogram. This may be done every 1-2 years. Talk with your health care provider about how often you should have regular mammograms.  BRCA-related cancer screening. This may be done if you have a family history of breast, ovarian, tubal, or peritoneal cancers. Other tests  Sexually transmitted disease (STD) testing.  Bone density scan. This is done to screen for osteoporosis. You may have this done starting at age 81. Follow these instructions at home: Eating and drinking  Eat a diet that includes fresh fruits and vegetables, whole grains, lean protein, and low-fat dairy products. Limit your intake of foods with high amounts of sugar, saturated fats, and salt.  Take vitamin and mineral supplements as recommended by your health care provider.  Do not drink alcohol if your health care provider tells you not to drink.  If you drink alcohol: ? Limit how much you have to 0-1 drink a day. ? Be aware of how much alcohol is in your drink. In the [place] one drink equals one 12 oz bottle of  beer (355 mL), one 5 oz glass of wine (148 mL), or one 1 oz glass of hard liquor (44 mL). Lifestyle  Take daily care of your teeth and gums.  Stay active. Exercise for at least 30 minutes on 5 or more days each week.  Do not use any products that contain nicotine or tobacco, such as cigarettes, e-cigarettes, and chewing tobacco. If you need help quitting, ask your health care provider.  If you are sexually active, practice safe sex. Use a condom or other form of protection in order to prevent STIs (sexually transmitted infections).  Talk with your health care provider about taking a low-dose aspirin or statin. What's next?  Go to your health care provider once a year for a well check visit.    Ask your health care provider how often you should have your eyes and teeth checked.  Stay up to date on all vaccines. This information is not intended to replace advice given to you by your health care provider. Make sure you discuss any questions you have with your health care provider. Document Revised: 09/24/2018 Document Reviewed: 09/24/2018 Elsevier Patient Education  2020 Elsevier Inc.  

## 2019-12-21 NOTE — Assessment & Plan Note (Signed)
F/u ortho Using cbd oil and cream

## 2019-12-21 NOTE — Assessment & Plan Note (Signed)
Well controlled, no changes to meds. Encouraged heart healthy diet such as the DASH diet and exercise as tolerated.  °

## 2019-12-21 NOTE — Assessment & Plan Note (Signed)
Encouraged heart healthy diet, increase exercise, avoid trans fats, consider a krill oil cap daily 

## 2019-12-22 ENCOUNTER — Telehealth: Payer: Self-pay | Admitting: Pharmacist

## 2019-12-22 NOTE — Chronic Care Management (AMB) (Signed)
Called patient to assess HTN and satisfaction with [facility].   She reports she has not been checking her BP at home noting her BP was at goal at office visit yesterday. She states she is very pleased with [facility] so far.  Reviewed that her lab work was within normal range from yesterday.  Reviewed pharmacy profile compared to office chart to assure no gaps in therapy.  Encouraged patient to call me if she experiences any problems or has any questions regarding her medications or the pharmacy. Patient verbalized understanding and confirmed her 6 month follow up appt with me on 05/24/20.

## 2019-12-23 ENCOUNTER — Ambulatory Visit: Payer: PPO

## 2019-12-23 ENCOUNTER — Other Ambulatory Visit: Payer: Self-pay

## 2019-12-23 ENCOUNTER — Ambulatory Visit: Payer: PPO | Admitting: Podiatry

## 2019-12-23 ENCOUNTER — Encounter: Payer: Self-pay | Admitting: Podiatry

## 2019-12-23 VITALS — Temp 97.9°F

## 2019-12-23 DIAGNOSIS — L600 Ingrowing nail: Secondary | ICD-10-CM

## 2019-12-23 DIAGNOSIS — M778 Other enthesopathies, not elsewhere classified: Secondary | ICD-10-CM | POA: Diagnosis not present

## 2019-12-23 MED ORDER — NEOMYCIN-POLYMYXIN-HC 1 % OT SOLN: OTIC | 1 refills | Status: DC

## 2019-12-23 NOTE — Progress Notes (Signed)
Subjective:  Patient ID: Kimberly Edwards, female    DOB: 1941/02/02,  MRN: [id number] HPI Chief Complaint  Patient presents with  . Foot Pain    Lateral foot right - aching x 2 months, fell at Christmas, but foot wasn't hurt then, tried CBD cream, rest, Ortho xrayed (brought today) and referred here  . Toe Pain    Hallux right - medial border, tender x few weeks  . New Patient (Initial Visit)    79 y.o. female presents with the above complaint.   ROS: Denies fever chills nausea vomiting muscle aches pains calf pain back pain chest pain shortness of breath.  Past Medical History:  Diagnosis Date  . Arthritis   . Depression   . Heart murmur   . Hyperlipidemia   . Hypertension   . Knee joint replacement status, left 2003  . PONV (postoperative nausea and vomiting)   . Right knee DJD    Past Surgical History:  Procedure Laterality Date  . BREAST BIOPSY  2000   marker put in  . CHOLECYSTECTOMY  1998  . JOINT REPLACEMENT    . REPLACEMENT TOTAL KNEE     left knee  . TONSILLECTOMY    . TOTAL KNEE ARTHROPLASTY  03/23/2012   Procedure: TOTAL KNEE ARTHROPLASTY;  Surgeon: Lorn Junes, MD;  Location: [facility];  Service: Orthopedics;  Laterality: Right;  right total knee arthroplasty    Current Outpatient Medications:  .  amoxicillin (AMOXIL) 500 MG capsule, TAKE 4 CAPSULES BY MOUTH BEFORE DENTAL PROCEDURES, Disp: 30 capsule, Rfl: 0 .  aspirin 81 MG tablet, Take 81 mg by mouth daily., Disp: , Rfl:  .  citalopram (CELEXA) 10 MG tablet, Take 1 tablet (10 mg total) by mouth daily., Disp: 90 tablet, Rfl: 3 .  diltiazem (CARDIZEM CD) 300 MG 24 hr capsule, TAKE 1 CAPSULE BY MOUTH EVERY DAY, Disp: 90 capsule, Rfl: 1 .  fish oil-omega-3 fatty acids 1000 MG capsule, Take 2 g by mouth daily. , Disp: , Rfl:  .  GLUCOSAMINE-CHONDROITIN PO, Take 1 tablet by mouth daily. 1500mg  of glucosamine, Disp: , Rfl:  .  lisinopril (ZESTRIL) 30 MG tablet, Take 1 tablet (30 mg total) by mouth daily., Disp: 90  tablet, Rfl: 1 .  meloxicam (MOBIC) 15 MG tablet, Take 1 tablet (15 mg total) by mouth daily., Disp: 90 tablet, Rfl: 1 .  Multiple Vitamins-Minerals (MULTIVITAMIN WITH MINERALS) tablet, Take 2 tablets by mouth daily. Vitafusion gummie, Disp: , Rfl:  .  NEOMYCIN-POLYMYXIN-HYDROCORTISONE (CORTISPORIN) 1 % SOLN OTIC solution, Apply 1-2 drops to toe BID after soaking, Disp: 10 mL, Rfl: 1 .  Polyethyl Glycol-Propyl Glycol (SYSTANE) 0.4-0.3 % SOLN, Apply 1 drop to eye daily. , Disp: , Rfl:  .  pravastatin (PRAVACHOL) 40 MG tablet, Take 1 tablet (40 mg total) by mouth daily., Disp: 90 tablet, Rfl: 1 .  triamterene-hydrochlorothiazide (DYAZIDE) 37.5-25 MG capsule, Take 1 each (1 capsule total) by mouth daily., Disp: 90 capsule, Rfl: 1  Allergies  Allergen Reactions  . Oxycodone Nausea And Vomiting   Review of Systems Objective:   Vitals:   12/23/19 1358  Temp: 97.9 F (36.6 C)    General: Well developed, nourished, in no acute distress, alert and oriented x3   Dermatological: Skin is warm, dry and supple bilateral. Nails x 10 are well maintained; remaining integument appears unremarkable at this time. There are no open sores, no preulcerative lesions, no rash or signs of infection present.  Sharp ingrown toenail tibial border of  the hallux right.  No erythema cellulitis drainage or odor.  Painful on palpation incurvated nail margin.  Vascular: Dorsalis Pedis artery and Posterior Tibial artery pedal pulses are 2/4 bilateral with immedate capillary fill time. Pedal hair growth present. No varicosities and no lower extremity edema present bilateral.   Neruologic: Grossly intact via light touch bilateral. Vibratory intact via tuning fork bilateral. Protective threshold with Semmes Wienstein monofilament intact to all pedal sites bilateral. Patellar and Achilles deep tendon reflexes 2+ bilateral. No Babinski or clonus noted bilateral.   Musculoskeletal: No gross boney pedal deformities bilateral. No  pain, crepitus, or limitation noted with foot and ankle range of motion bilateral. Muscular strength 5/5 in all groups tested bilateral.  Pain on palpation at a severely arthritic fourth fifth TMT joint.  Frontal plane range of motion exquisitely painful as well as direct palpation.  Gait: Unassisted, Nonantalgic.    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  No fractures are identified.  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  Painful ingrown toenail tibial border hallux right.  Plan: Discussed etiology pathology conservative surgical therapies at this point performed chemical matrixectomy to the tibial border of the hallux right she tolerated procedure well after local anesthetic was administered.  She was given both oral and written home-going instruction for the care and soaking of the toe as well as a prescription for Cortisporin Otic to be applied twice daily after soaking.  Also injected the painful joint area today obviously not able to get into the joint would place the 20 mg of Kenalog on the dorsal lateral aspect of the joint endoleak alleviated her symptoms immediately.  Follow-up with her in 2 weeks     Shyrl Obi T. [place], [place]

## 2019-12-23 NOTE — Patient Instructions (Signed)

## 2020-01-06 ENCOUNTER — Encounter: Payer: Self-pay | Admitting: Podiatry

## 2020-01-06 ENCOUNTER — Ambulatory Visit (INDEPENDENT_AMBULATORY_CARE_PROVIDER_SITE_OTHER): Payer: PPO | Admitting: Podiatry

## 2020-01-06 ENCOUNTER — Other Ambulatory Visit: Payer: Self-pay

## 2020-01-06 DIAGNOSIS — L600 Ingrowing nail: Secondary | ICD-10-CM

## 2020-01-06 DIAGNOSIS — Z9889 Other specified postprocedural states: Secondary | ICD-10-CM

## 2020-01-06 NOTE — Progress Notes (Signed)
She presents today for follow-up of her matrixectomy of her right hallux she states that she stopped soaking yesterday.  States the toenail feels great is very happy with the arthritis in her right foot after the injection she says it feels wonderful and I can actually move my foot again.  Objective: Vital signs are stable she is alert and oriented x3.  Pulses are palpable.  Matrixectomy site is completely healed there is no erythema edema cellulitis drainage or odor there is no reproducible pain on palpation of the foot or range of motion of the foot.  Assessment: Well-healing surgical toe hallux right resolving pain from osteoarthritis midfoot right.  Plan: Follow-up with me on an as-needed basis.

## 2020-01-07 ENCOUNTER — Ambulatory Visit: Payer: PPO | Admitting: Podiatry

## 2020-02-09 ENCOUNTER — Ambulatory Visit: Payer: Self-pay | Admitting: Pharmacist

## 2020-02-09 NOTE — Chronic Care Management (AMB) (Signed)
Reviewed chart for medication changes ahead of medication coordination call.  Below are the OVs, Consults, or hospital visits since last care coordination call/Pharmacist visit.  01/06/20: Podiatry visit w/ Dr. Milinda Pointer - No med changes noted  12/23/19: Podiatry visit w/ Dr. Milinda Pointer - Prescription for cortisporin otic to be applied twice daily after soaking.   BP Readings from Last 3 Encounters:  12/21/19 124/88  06/22/19 128/80  12/22/18 138/82    Lab Results  Component Value Date   HGBA1C 5.6 06/08/2012     Patient obtains medications through Adherence Packaging  90 Days   Patient is due for next adherence delivery on: 02/11/2020. Called patient and reviewed medications and coordinated delivery.  This delivery to include: Citalopram 10mg  daily-Before Breakfast  Meloxicam 15mg  daily-Before Breakfast  Triamterene 37.5mg  daily-Before Breakfast  Lisinopril 30mg  daily-Before Breakfast  Diltiazem Cd 300mg  daily-Before Breakfast  Pravastatin 40mg  daily-Before Breakfast  Confirmed delivery date of 02/11/2020, advised patient that pharmacy will contact them the morning of delivery.

## 2020-03-29 DIAGNOSIS — L578 Other skin changes due to chronic exposure to nonionizing radiation: Secondary | ICD-10-CM | POA: Diagnosis not present

## 2020-03-29 DIAGNOSIS — L821 Other seborrheic keratosis: Secondary | ICD-10-CM | POA: Diagnosis not present

## 2020-03-29 DIAGNOSIS — Z85828 Personal history of other malignant neoplasm of skin: Secondary | ICD-10-CM | POA: Diagnosis not present

## 2020-03-29 DIAGNOSIS — D2271 Melanocytic nevi of right lower limb, including hip: Secondary | ICD-10-CM | POA: Diagnosis not present

## 2020-03-29 DIAGNOSIS — Z86018 Personal history of other benign neoplasm: Secondary | ICD-10-CM | POA: Diagnosis not present

## 2020-03-29 DIAGNOSIS — L739 Follicular disorder, unspecified: Secondary | ICD-10-CM | POA: Diagnosis not present

## 2020-03-29 DIAGNOSIS — D225 Melanocytic nevi of trunk: Secondary | ICD-10-CM | POA: Diagnosis not present

## 2020-03-29 DIAGNOSIS — D2222 Melanocytic nevi of left ear and external auricular canal: Secondary | ICD-10-CM | POA: Diagnosis not present

## 2020-05-08 ENCOUNTER — Ambulatory Visit: Payer: Self-pay | Admitting: Pharmacist

## 2020-05-08 ENCOUNTER — Telehealth: Payer: Self-pay

## 2020-05-08 ENCOUNTER — Telehealth: Payer: Self-pay | Admitting: Pharmacist

## 2020-05-08 DIAGNOSIS — I10 Essential (primary) hypertension: Secondary | ICD-10-CM

## 2020-05-08 DIAGNOSIS — E782 Mixed hyperlipidemia: Secondary | ICD-10-CM

## 2020-05-08 MED ORDER — DILTIAZEM HCL ER COATED BEADS 300 MG PO CP24: ORAL_CAPSULE | ORAL | 1 refills | Status: DC

## 2020-05-08 MED ORDER — LISINOPRIL 30 MG PO TABS: 30.0000 mg | ORAL_TABLET | Freq: Every day | ORAL | 1 refills | Status: DC

## 2020-05-08 MED ORDER — PRAVASTATIN SODIUM 40 MG PO TABS: 40.0000 mg | ORAL_TABLET | Freq: Every day | ORAL | 1 refills | Status: DC

## 2020-05-08 NOTE — Chronic Care Management (AMB) (Signed)
Reviewed chart for medication changes ahead of medication coordination call.  No OVs, Consults, or hospital visits since last care coordination call/Pharmacist visit.  No medication changes indicated OR if recent visit, treatment plan here.  BP Readings from Last 3 Encounters:  12/21/19 124/88  06/22/19 128/80  12/22/18 138/82    Lab Results  Component Value Date   HGBA1C 5.6 06/08/2012     Patient obtains medications through Adherence Packaging  90 Days   Last adherence delivery included:  Citalopram 10mg  daily-Before Breakfast  Meloxicam 15mg  daily-Before Breakfast  Triamterene 37.5mg  daily-Before Breakfast  Lisinopril 30mg  daily-Before Breakfast  Diltiazem Cd 300mg  daily-Before Breakfast  Pravastatin 40mg  daily-Before Breakfast  Patient is due for next adherence delivery on: 05/08/20. Called patient and reviewed medications and coordinated delivery.  This delivery to include: Citalopram 10mg  daily-Before Breakfast   Meloxicam 15mg  daily-Before Breakfast   Triamterene 37.5mg  daily-Before Breakfast   Lisinopril 30mg  daily-Before Breakfast   Diltiazem Cd 300mg  daily-Before Breakfast   Pravastatin 40mg  daily-Before Breakfast    Patient needs refills for lisinopril, diltiazem, and pravastatin.  Confirmed delivery date of 05/08/20, advised patient that pharmacy will contact them the morning of delivery.

## 2020-05-08 NOTE — Telephone Encounter (Signed)
-----   Message from [place], [facility] sent at 05/08/2020 11:33 AM EDT ----- Regarding: Refills to [facility] Hey!   Would you be willing to send in 78 DS with appropriate refills for the following medications to [facility]?   Lisinopril 30mg  Diltiazem 300mg  Pravastatin 40mg   Thanks for your time and help,   De Blanch, PharmD Clinical Pharmacist [facility] at [facility] [phone]

## 2020-05-08 NOTE — Telephone Encounter (Signed)
Medications have been sent to pharmacy 

## 2020-05-24 ENCOUNTER — Ambulatory Visit: Payer: PPO | Admitting: Pharmacist

## 2020-05-24 DIAGNOSIS — E782 Mixed hyperlipidemia: Secondary | ICD-10-CM

## 2020-05-24 DIAGNOSIS — I10 Essential (primary) hypertension: Secondary | ICD-10-CM

## 2020-05-24 NOTE — Patient Instructions (Signed)
Visit Information  Goals Addressed            This Visit's Progress   . Chronic Care Management Pharmacy Care Plan       CARE PLAN ENTRY (see longitudinal plan of care for additional care plan information)  Current Barriers:  . Chronic Disease Management support, education, and care coordination needs related to HTN, HLD, GAD, Osteoarthritis   Hypertension BP Readings from Last 3 Encounters:  12/21/19 124/88  06/22/19 128/80  12/22/18 138/82   . Pharmacist Clinical Goal(s): o Over the next 180 days, patient will work with PharmD and providers to maintain BP goal <140/90 . Current regimen:   Diltiazem 300mg  daily  Lisinopril 30mg  daily  Triamterene-hctz 37.5-25mg  daily . Patient self care activities - Over the next 180 days, patient will: o Check BP 3-4 times per week, document, and provide at future appointments o Ensure daily salt intake < 2300 mg/Zayden Maffei  Hyperlipidemia Lab Results  Component Value Date/Time   LDLCALC 85 12/21/2019 09:38 AM   LDLDIRECT 121.0 03/30/2015 10:14 AM   . Pharmacist Clinical Goal(s): o Over the next 180 days, patient will work with PharmD and providers to maintain LDL goal < 100 . Current regimen:  o Pravastatin 40mg  daily . Patient self care activities - Over the next 180 days, patient will: o Maintain cholesterol medication regimen.   Osteopenia/Osteoporosis Screening . Pharmacist Clinical Goal(s) o Over the next 90 days, patient will work with PharmD and providers to maintain bone strength . Current regimen:  o None . Interventions: o Discussed benefit of repeat DEXA Scan . Patient self care activities - Over the next 90 days, patient will: o Repeat DEXA Scan  Health Maintenance  . Pharmacist Clinical Goal(s) o Over the next 90 days, patient will work with PharmD and providers to complete health maintenance screenings/vaccinations . Interventions: o Consider completing Shingrix Vaccine Series . Patient self care activities -  Over the next 90 days, patient will: o Consider repeating Shingrix vaccine series   Medication management . Pharmacist Clinical Goal(s): o Over the next 180 days, patient will work with PharmD and providers to maintain optimal medication adherence . Current pharmacy: [facility] . Interventions o Comprehensive medication review performed. o Utilize [facility] pharmacy for medication synchronization, packaging and delivery . Patient self care activities - Over the next 180 days, patient will: o Focus on medication adherence by filling and taking medications appropriately  o Take medications as prescribed o Report any questions or concerns to PharmD and/or provider(s)  Please see past updates related to this goal by clicking on the "Past Updates" button in the selected goal         The patient verbalized understanding of instructions provided today and agreed to receive a mailed copy of patient instruction and/or educational materials.  Telephone follow up appointment with pharmacy team member scheduled for: 11/21/2020  De Blanch, PharmD Clinical Pharmacist [facility] at [facility] [phone]

## 2020-05-24 NOTE — Chronic Care Management (AMB) (Signed)
Chronic Care Management Pharmacy  Name: Kimberly Edwards  MRN: [id number] DOB: 1941/09/17   Chief Complaint/ HPI  Kimberly Edwards,  79 y.o. , female presents for their Follow-Up CCM visit with the clinical pharmacist via telephone.  PCP : Ann Held, DO  Their chronic conditions include: HTN, HLD, GAD, Osteoarthritis  Office Visits: None since last CCM visit on 12/22/19.   Consult Visit: 01/06/20: Podiatry visit w/ Dr. Milinda Pointer - Matrixectomy completely healed. F/U PRN.  12/23/19: Podiatry visit w/ Dr. Milinda Pointer - Chemical matrixectomy. Kenalog injection. RTC 2 weeks.   Medications: Outpatient Encounter Medications as of 05/24/2020  Medication Sig  . amoxicillin (AMOXIL) 500 MG capsule TAKE 4 CAPSULES BY MOUTH BEFORE DENTAL PROCEDURES  . aspirin 81 MG tablet Take 81 mg by mouth daily.  . citalopram (CELEXA) 10 MG tablet Take 1 tablet (10 mg total) by mouth daily.  [name] Kitchen diltiazem (CARDIZEM CD) 300 MG 24 hr capsule TAKE 1 CAPSULE BY MOUTH EVERY Dezaree Tracey  . fish oil-omega-3 fatty acids 1000 MG capsule Take 2 g by mouth daily.   [name] Kitchen GLUCOSAMINE-CHONDROITIN PO Take 1 tablet by mouth daily. 1500mg  of glucosamine  . lisinopril (ZESTRIL) 30 MG tablet Take 1 tablet (30 mg total) by mouth daily.  . meloxicam (MOBIC) 15 MG tablet Take 1 tablet (15 mg total) by mouth daily.  . Multiple Vitamins-Minerals (MULTIVITAMIN WITH MINERALS) tablet Take 2 tablets by mouth daily. Vitafusion gummie  . NEOMYCIN-POLYMYXIN-HYDROCORTISONE (CORTISPORIN) 1 % SOLN OTIC solution Apply 1-2 drops to toe BID after soaking  . Polyethyl Glycol-Propyl Glycol (SYSTANE) 0.4-0.3 % SOLN Apply 1 drop to eye daily.   . pravastatin (PRAVACHOL) 40 MG tablet Take 1 tablet (40 mg total) by mouth daily.  [name] Kitchen triamterene-hydrochlorothiazide (DYAZIDE) 37.5-25 MG capsule Take 1 each (1 capsule total) by mouth daily.   No facility-administered encounter medications on file as of 05/24/2020.   SDOH Screenings   Alcohol Screen:   . Last  Alcohol Screening Score (AUDIT):   Depression (PHQ2-9):   . PHQ-2 Score:   Financial Resource Strain: Low Risk   . Difficulty of Paying Living Expenses: Not very hard  Food Insecurity:   . Worried About Charity fundraiser in the Last Year:   . [place] in the Last Year:   Housing:   . Last Housing Risk Score:   Physical Activity:   . Days of Exercise per Week:   . Minutes of Exercise per Session:   Social Connections:   . Frequency of Communication with Friends and Family:   . Frequency of Social Gatherings with Friends and Family:   . Attends Religious Services:   . Active Member of Clubs or Organizations:   . Attends Archivist Meetings:   [name] Kitchen Marital Status:   Stress:   . Feeling of Stress :   Tobacco Use: Medium Risk  . Smoking Tobacco Use: Former Smoker  . Smokeless Tobacco Use: Never Used  Transportation Needs:   . Film/video editor (Medical):   [name] Kitchen Lack of Transportation (Non-Medical):      Current Diagnosis/Assessment:  Goals Addressed            This Visit's Progress   . Chronic Care Management Pharmacy Care Plan       CARE PLAN ENTRY (see longitudinal plan of care for additional care plan information)  Current Barriers:  . Chronic Disease Management support, education, and care coordination needs related to HTN, HLD, GAD, Osteoarthritis   Hypertension BP Readings  from Last 3 Encounters:  12/21/19 124/88  06/22/19 128/80  12/22/18 138/82   . Pharmacist Clinical Goal(s): o Over the next 180 days, patient will work with PharmD and providers to maintain BP goal <140/90 . Current regimen:   Diltiazem 300mg  daily  Lisinopril 30mg  daily  Triamterene-hctz 37.5-25mg  daily . Patient self care activities - Over the next 180 days, patient will: o Check BP 3-4 times per week, document, and provide at future appointments o Ensure daily salt intake < 2300 mg/Holland Kotter  Hyperlipidemia Lab Results  Component Value Date/Time   LDLCALC 85  12/21/2019 09:38 AM   LDLDIRECT 121.0 03/30/2015 10:14 AM   . Pharmacist Clinical Goal(s): o Over the next 180 days, patient will work with PharmD and providers to maintain LDL goal < 100 . Current regimen:  o Pravastatin 40mg  daily . Patient self care activities - Over the next 180 days, patient will: o Maintain cholesterol medication regimen.   Osteopenia/Osteoporosis Screening . Pharmacist Clinical Goal(s) o Over the next 90 days, patient will work with PharmD and providers to maintain bone strength . Current regimen:  o None . Interventions: o Discussed benefit of repeat DEXA Scan . Patient self care activities - Over the next 90 days, patient will: o Repeat DEXA Scan  Health Maintenance  . Pharmacist Clinical Goal(s) o Over the next 90 days, patient will work with PharmD and providers to complete health maintenance screenings/vaccinations . Interventions: o Consider completing Shingrix Vaccine Series . Patient self care activities - Over the next 90 days, patient will: o Consider repeating Shingrix vaccine series   Medication management . Pharmacist Clinical Goal(s): o Over the next 180 days, patient will work with PharmD and providers to maintain optimal medication adherence . Current pharmacy: [facility] . Interventions o Comprehensive medication review performed. o Utilize [facility] pharmacy for medication synchronization, packaging and delivery . Patient self care activities - Over the next 180 days, patient will: o Focus on medication adherence by filling and taking medications appropriately  o Take medications as prescribed o Report any questions or concerns to PharmD and/or provider(s)  Please see past updates related to this goal by clicking on the "Past Updates" button in the selected goal        Social Hx: Daughter and son. 5 grandchildren. 4 girls, 1 son. Husband passed 10 years ago. Lives near her daughter. Son lives in [place].  Uses a pill box 2 weeks  at a time.   2 Vitafusions multivitamins Glucosamine 1500mg  ([employer]) CBD Oil 500mg  (H[employer] Extract Full Sprectrum) - Takes 50mg  (1 drop) AM and 50mg  (1 drop) PM  Mammogram in 07/02/2019. No malignancy Last DEXA 2013 Normal  Hypertension   BP Goal <140/90  CMP Latest Ref Rng & Units 12/21/2019 06/22/2019 12/22/2018  Glucose 70 - 99 mg/dL 92 90 90  BUN 6 - 23 mg/dL 31(H) 25(H) 25(H)  Creatinine 0.40 - 1.20 mg/dL 0.78 0.68 0.72  Sodium 135 - 145 mEq/L 138 140 140  Potassium 3.5 - 5.1 mEq/L 4.2 4.1 4.5  Chloride 96 - 112 mEq/L 105 106 102  CO2 19 - 32 mEq/L 27 26 30   Calcium 8.4 - 10.5 mg/dL 9.5 9.6 9.9  Total Protein 6.0 - 8.3 g/dL 6.8 6.9 7.1  Total Bilirubin 0.2 - 1.2 mg/dL 0.6 0.6 0.7  Alkaline Phos 39 - 117 U/L 63 63 68  AST 0 - 37 U/L 17 17 19   ALT 0 - 35 U/L 12 12 14    Kidney Function  Lab Results  Component Value Date/Time   CREATININE 0.78 12/21/2019 09:38 AM   CREATININE 0.68 06/22/2019 10:06 AM   GFR 70.95 12/21/2019 09:38 AM   GFRNONAA 59 (L) 03/25/2012 05:00 AM   GFRAA 69 (L) 03/25/2012 05:00 AM   K 4.2 12/21/2019 09:38 AM   K 4.1 06/22/2019 10:06 AM   Office blood pressures are  BP Readings from Last 3 Encounters:  12/21/19 124/88  06/22/19 128/80  12/22/18 138/82    Patient has failed these meds in the past: None noted Patient is currently controlled on the following medications:   Diltiazem 300mg  daily  Lisinopril 30mg  daily  Triamterene-hctz 37.5-25mg  daily  Does not check BP at home. Lost 40lbs over the last 3 years and BP improved so she really doesn't check anymore. Reports she has a very healthy diet.  Has a BP cuff, but has not used in a while   Patient checks BP at home 3-5x per week   We discussed importance of BP control   Update 05/24/20 States her BP fluctuates, but she isn't sure why.  Feels like she had a lot of company during July, but now things are back to normal.  States she has been checking every other Doni Widmer, but  can't report any specific readings.  States her BP is in the 140s-150s/70s-80s. Denies headaches, chest pains. Would occasionally would get dizzy when her BP was elevated, but she states this is when she had a lot of company during beginning of July.  She reports she gained 5lbs in July and this has stressed her out. She is starting her weight watchers program back.   Plan  -Continue current medications   Hyperlipidemia   LDL goal <100  Lipid Panel     Component Value Date/Time   CHOL 164 12/21/2019 0938   TRIG 137.0 12/21/2019 0938   HDL 51.10 12/21/2019 0938   CHOLHDL 3 12/21/2019 0938   VLDL 27.4 12/21/2019 0938   LDLCALC 85 12/21/2019 0938   LDLDIRECT 121.0 03/30/2015 1014   Patient has failed these meds in past: None noted Patient is currently controlled on the following medications:   Pravastatin 40mg  daily  Adherent to therapy as evidenced by [facility] adherence packaging.  Plan -Continue current medications  Osteopenia / Osteoporosis Screening   Last DEXA Scan: 12/31/2011  T-Score femoral neck: -0.6  T-Score forearm radius: -0.6  No results found for: VD25OH   Patient is not a candidate for pharmacologic treatment  Patient has failed these meds in past: None noted  Patient is currently controlled on the following medications:  . None  We discussed:  Patient may benefit from repeat DEXA Scan  Plan -Consider repeat DEXA Scan during visit w/ Dr. Etter Sjogren on 06/23/20  Vaccines   Reviewed and discussed patient's vaccination history.    Immunization History  Administered Date(s) Administered  . Fluad Quad(high Dose 65+) 06/22/2019  . Influenza, High Dose Seasonal PF 06/22/2014, 06/27/2015, 06/18/2016, 06/17/2017, 06/23/2018  . Influenza,inj,Quad PF,6+ Mos 07/01/2013  . Influenza-Unspecified 06/14/2014  . PFIZER SARS-COV-2 Vaccination 11/06/2019, 11/27/2019  . Pneumococcal Conjugate-13 09/29/2014  . Pneumococcal Polysaccharide-23 10/19/2015     Plan -Recommended patient receive Shingrix vaccine in pharmacy.   [facility] Reviewed patient's [facility] medication and Epic medication profile assuring there are no discrepancies or gaps in therapy. Confirmed all fill dates appropriate and verified with patient that there is a sufficient quantity of all prescribed medications at home. Informed patient to call me any time if needing medications before scheduled deliveries.  The anticipated medication sync date is 08/07/20 .

## 2020-05-24 NOTE — Chronic Care Management (AMB) (Signed)
Error

## 2020-06-16 DIAGNOSIS — R58 Hemorrhage, not elsewhere classified: Secondary | ICD-10-CM | POA: Diagnosis not present

## 2020-06-16 DIAGNOSIS — L739 Follicular disorder, unspecified: Secondary | ICD-10-CM | POA: Diagnosis not present

## 2020-06-23 ENCOUNTER — Encounter: Payer: Self-pay | Admitting: Family Medicine

## 2020-06-23 ENCOUNTER — Ambulatory Visit (INDEPENDENT_AMBULATORY_CARE_PROVIDER_SITE_OTHER): Payer: PPO | Admitting: Family Medicine

## 2020-06-23 ENCOUNTER — Other Ambulatory Visit: Payer: Self-pay

## 2020-06-23 ENCOUNTER — Ambulatory Visit: Payer: PPO | Attending: Internal Medicine

## 2020-06-23 ENCOUNTER — Ambulatory Visit: Payer: PPO | Admitting: Family Medicine

## 2020-06-23 VITALS — BP 124/70 | HR 67 | Temp 98.1°F | Resp 18 | Ht 59.0 in | Wt 153.0 lb

## 2020-06-23 DIAGNOSIS — Z96653 Presence of artificial knee joint, bilateral: Secondary | ICD-10-CM | POA: Diagnosis not present

## 2020-06-23 DIAGNOSIS — H9193 Unspecified hearing loss, bilateral: Secondary | ICD-10-CM | POA: Insufficient documentation

## 2020-06-23 DIAGNOSIS — E2839 Other primary ovarian failure: Secondary | ICD-10-CM

## 2020-06-23 DIAGNOSIS — M1990 Unspecified osteoarthritis, unspecified site: Secondary | ICD-10-CM | POA: Diagnosis not present

## 2020-06-23 DIAGNOSIS — Z1231 Encounter for screening mammogram for malignant neoplasm of breast: Secondary | ICD-10-CM

## 2020-06-23 DIAGNOSIS — Z23 Encounter for immunization: Secondary | ICD-10-CM

## 2020-06-23 DIAGNOSIS — E782 Mixed hyperlipidemia: Secondary | ICD-10-CM

## 2020-06-23 DIAGNOSIS — I10 Essential (primary) hypertension: Secondary | ICD-10-CM

## 2020-06-23 MED ORDER — MELOXICAM 15 MG PO TABS: 15.0000 mg | ORAL_TABLET | Freq: Every day | ORAL | 1 refills | Status: DC

## 2020-06-23 MED ORDER — AMOXICILLIN 500 MG PO CAPS: ORAL_CAPSULE | ORAL | 0 refills | Status: DC

## 2020-06-23 MED ORDER — PRAVASTATIN SODIUM 40 MG PO TABS: 40.0000 mg | ORAL_TABLET | Freq: Every day | ORAL | 1 refills | Status: DC

## 2020-06-23 MED ORDER — LISINOPRIL 30 MG PO TABS: 30.0000 mg | ORAL_TABLET | Freq: Every day | ORAL | 1 refills | Status: DC

## 2020-06-23 MED ORDER — TRIAMTERENE-HCTZ 37.5-25 MG PO CAPS: 1.0000 | ORAL_CAPSULE | Freq: Every day | ORAL | 1 refills | Status: DC

## 2020-06-23 MED ORDER — DILTIAZEM HCL ER COATED BEADS 300 MG PO CP24: ORAL_CAPSULE | ORAL | 1 refills | Status: DC

## 2020-06-23 NOTE — Progress Notes (Signed)
Patient ID: Kimberly Edwards, female    DOB: March 26, 1941  Age: 79 y.o. MRN: [id number]    Subjective:  Subjective  HPI Kimberly Edwards presents for f/u bp and cholesterol   Pt c/o hearing loss and is asking for a referral otherwise no new complaints   Review of Systems  Constitutional: Negative for appetite change, diaphoresis, fatigue and unexpected weight change.  Eyes: Negative for pain, redness and visual disturbance.  Respiratory: Negative for cough, chest tightness, shortness of breath and wheezing.   Cardiovascular: Negative for chest pain, palpitations and leg swelling.  Endocrine: Negative for cold intolerance, heat intolerance, polydipsia, polyphagia and polyuria.  Genitourinary: Negative for difficulty urinating, dysuria and frequency.  Neurological: Negative for dizziness, light-headedness, numbness and headaches.    History Past Medical History:  Diagnosis Date  . Arthritis   . Depression   . Heart murmur   . Hyperlipidemia   . Hypertension   . Knee joint replacement status, left 2003  . PONV (postoperative nausea and vomiting)   . Right knee DJD     She has a past surgical history that includes Cholecystectomy (1998); Replacement total knee; Breast biopsy (2000); Tonsillectomy; Total knee arthroplasty (03/23/2012); and Joint replacement.   Her family history includes Breast cancer in her sister; Hypertension in her father.She reports that she quit smoking about 8 years ago. Her smoking use included cigarettes. She has a 5.00 pack-year smoking history. She has never used smokeless tobacco. She reports current alcohol use. She reports that she does not use drugs.  Current Outpatient Medications on File Prior to Visit  Medication Sig Dispense Refill  . aspirin 81 MG tablet Take 81 mg by mouth daily.    . citalopram (CELEXA) 10 MG tablet Take 1 tablet (10 mg total) by mouth daily. 90 tablet 3  . fish oil-omega-3 fatty acids 1000 MG capsule Take 2 g by mouth daily.     [name] Kitchen  GLUCOSAMINE-CHONDROITIN PO Take 1 tablet by mouth daily. 1500mg  of glucosamine    . Multiple Vitamins-Minerals (MULTIVITAMIN WITH MINERALS) tablet Take 2 tablets by mouth daily. Vitafusion gummie    . NEOMYCIN-POLYMYXIN-HYDROCORTISONE (CORTISPORIN) 1 % SOLN OTIC solution Apply 1-2 drops to toe BID after soaking 10 mL 1  . Polyethyl Glycol-Propyl Glycol (SYSTANE) 0.4-0.3 % SOLN Apply 1 drop to eye daily.      No current facility-administered medications on file prior to visit.     Objective:  Objective  Physical Exam Vitals and nursing note reviewed.  Constitutional:      Appearance: She is well-developed.  HENT:     Head: Normocephalic and atraumatic.  Eyes:     Conjunctiva/sclera: Conjunctivae normal.  Neck:     Thyroid: No thyromegaly.     Vascular: No carotid bruit or JVD.  Cardiovascular:     Rate and Rhythm: Normal rate and regular rhythm.     Heart sounds: Normal heart sounds. No murmur heard.   Pulmonary:     Effort: Pulmonary effort is normal. No respiratory distress.     Breath sounds: Normal breath sounds. No wheezing or rales.  Chest:     Chest wall: No tenderness.  Musculoskeletal:     Cervical back: Normal range of motion and neck supple.  Neurological:     Mental Status: She is alert and oriented to person, place, and time.    BP 124/70 (BP Location: Left Arm, Patient Position: Sitting, Cuff Size: Normal)   Pulse 67   Temp 98.1 F (36.7 C) (Oral)  Resp 18   Ht 4\' 11"  (1.499 m)   Wt 153 lb (69.4 kg)   SpO2 97%   BMI 30.90 kg/m  Wt Readings from Last 3 Encounters:  06/23/20 153 lb (69.4 kg)  12/21/19 148 lb (67.1 kg)  06/22/19 151 lb 8 oz (68.7 kg)     Lab Results  Component Value Date   WBC 9.8 12/22/2018   HGB 15.5 (H) 12/22/2018   HCT 45.6 12/22/2018   PLT 293.0 12/22/2018   GLUCOSE 92 12/21/2019   CHOL 164 12/21/2019   TRIG 137.0 12/21/2019   HDL 51.10 12/21/2019   LDLDIRECT 121.0 03/30/2015   LDLCALC 85 12/21/2019   ALT 12 12/21/2019     AST 17 12/21/2019   NA 138 12/21/2019   K 4.2 12/21/2019   CL 105 12/21/2019   CREATININE 0.78 12/21/2019   BUN 31 (H) 12/21/2019   CO2 27 12/21/2019   TSH 1.88 09/27/2014   INR 0.98 03/17/2012   HGBA1C 5.6 06/08/2012    MM 3D SCREEN BREAST BILATERAL  Result Date: 07/02/2019 CLINICAL DATA:  Screening. EXAM: DIGITAL SCREENING BILATERAL MAMMOGRAM WITH TOMO AND CAD COMPARISON:  Previous exam(s). ACR Breast Density Category b: There are scattered areas of fibroglandular density. FINDINGS: There are no findings suspicious for malignancy. Images were processed with CAD. IMPRESSION: No mammographic evidence of malignancy. A result letter of this screening mammogram will be mailed directly to the patient. RECOMMENDATION: Screening mammogram in one year. (Code:SM-B-01Y) BI-RADS CATEGORY  1: Negative. Electronically Signed   By: Lajean Manes M.D.   On: 07/02/2019 11:19     Assessment & Plan:  Plan  I am having Levonia Arcand maintain her multivitamin with minerals, aspirin, fish oil-omega-3 fatty acids, GLUCOSAMINE-CHONDROITIN PO, Polyethyl Glycol-Propyl Glycol, citalopram, NEOMYCIN-POLYMYXIN-HYDROCORTISONE, amoxicillin, diltiazem, lisinopril, meloxicam, pravastatin, and triamterene-hydrochlorothiazide.  Meds ordered this encounter  Medications  . amoxicillin (AMOXIL) 500 MG capsule    Sig: TAKE 4 CAPSULES BY MOUTH BEFORE DENTAL PROCEDURES    Dispense:  30 capsule    Refill:  0  . diltiazem (CARDIZEM CD) 300 MG 24 hr capsule    Sig: TAKE 1 CAPSULE BY MOUTH EVERY DAY    Dispense:  90 capsule    Refill:  1  . lisinopril (ZESTRIL) 30 MG tablet    Sig: Take 1 tablet (30 mg total) by mouth daily.    Dispense:  90 tablet    Refill:  1  . meloxicam (MOBIC) 15 MG tablet    Sig: Take 1 tablet (15 mg total) by mouth daily.    Dispense:  90 tablet    Refill:  1  . pravastatin (PRAVACHOL) 40 MG tablet    Sig: Take 1 tablet (40 mg total) by mouth daily.    Dispense:  90 tablet    Refill:  1   . triamterene-hydrochlorothiazide (DYAZIDE) 37.5-25 MG capsule    Sig: Take 1 each (1 capsule total) by mouth daily.    Dispense:  90 capsule    Refill:  1    Problem List Items Addressed This Visit      Unprioritized   Arthritis   Relevant Medications   meloxicam (MOBIC) 15 MG tablet   Bilateral hearing loss - Primary   Relevant Orders   Ambulatory referral to Audiology   Estrogen deficiency   Relevant Orders   DG Bone Density   HTN (hypertension)    Well controlled, no changes to meds. Encouraged heart healthy diet such as the DASH diet and exercise as  tolerated.       Relevant Medications   diltiazem (CARDIZEM CD) 300 MG 24 hr capsule   lisinopril (ZESTRIL) 30 MG tablet   pravastatin (PRAVACHOL) 40 MG tablet   triamterene-hydrochlorothiazide (DYAZIDE) 37.5-25 MG capsule   Other Relevant Orders   Lipid panel   Comprehensive metabolic panel   Hyperlipidemia    Encouraged heart healthy diet, increase exercise, avoid trans fats, consider a krill oil cap daily      Relevant Medications   diltiazem (CARDIZEM CD) 300 MG 24 hr capsule   lisinopril (ZESTRIL) 30 MG tablet   pravastatin (PRAVACHOL) 40 MG tablet   triamterene-hydrochlorothiazide (DYAZIDE) 37.5-25 MG capsule   Other Relevant Orders   Lipid panel   Comprehensive metabolic panel    Other Visit Diagnoses    Status post total bilateral knee replacement       Relevant Medications   amoxicillin (AMOXIL) 500 MG capsule   Encounter for screening mammogram for malignant neoplasm of breast       Relevant Orders   MM DIAG BREAST TOMO BILATERAL      Follow-up: Return in about 6 months (around 12/21/2020), or if symptoms worsen or fail to improve, for annual exam, fasting.  Ann Held, DO

## 2020-06-23 NOTE — Assessment & Plan Note (Signed)
Well controlled, no changes to meds. Encouraged heart healthy diet such as the DASH diet and exercise as tolerated.  °

## 2020-06-23 NOTE — Patient Instructions (Signed)
DASH Eating Plan DASH stands for "Dietary Approaches to Stop Hypertension." The DASH eating plan is a healthy eating plan that has been shown to reduce high blood pressure (hypertension). It may also reduce your risk for type 2 diabetes, heart disease, and stroke. The DASH eating plan may also help with weight loss. What are tips for following this plan?  General guidelines  Avoid eating more than 2,300 mg (milligrams) of salt (sodium) a day. If you have hypertension, you may need to reduce your sodium intake to 1,500 mg a day.  Limit alcohol intake to no more than 1 drink a day for nonpregnant women and 2 drinks a day for men. One drink equals 12 oz of beer, 5 oz of wine, or 1 oz of hard liquor.  Work with your health care provider to maintain a healthy body weight or to lose weight. Ask what an ideal weight is for you.  Get at least 30 minutes of exercise that causes your heart to beat faster (aerobic exercise) most days of the week. Activities may include walking, swimming, or biking.  Work with your health care provider or diet and nutrition specialist (dietitian) to adjust your eating plan to your individual calorie needs. Reading food labels   Check food labels for the amount of sodium per serving. Choose foods with less than 5 percent of the Daily Value of sodium. Generally, foods with less than 300 mg of sodium per serving fit into this eating plan.  To find whole grains, look for the word "whole" as the first word in the ingredient list. Shopping  Buy products labeled as "low-sodium" or "no salt added."  Buy fresh foods. Avoid canned foods and premade or frozen meals. Cooking  Avoid adding salt when cooking. Use salt-free seasonings or herbs instead of table salt or sea salt. Check with your health care provider or pharmacist before using salt substitutes.  Do not fry foods. Cook foods using healthy methods such as baking, boiling, grilling, and broiling instead.  Cook with  heart-healthy oils, such as olive, canola, soybean, or sunflower oil. Meal planning  Eat a balanced diet that includes: ? 5 or more servings of fruits and vegetables each day. At each meal, try to fill half of your plate with fruits and vegetables. ? Up to 6-8 servings of whole grains each day. ? Less than 6 oz of lean meat, poultry, or fish each day. A 3-oz serving of meat is about the same size as a deck of cards. One egg equals 1 oz. ? 2 servings of low-fat dairy each day. ? A serving of nuts, seeds, or beans 5 times each week. ? Heart-healthy fats. Healthy fats called Omega-3 fatty acids are found in foods such as flaxseeds and coldwater fish, like sardines, salmon, and mackerel.  Limit how much you eat of the following: ? Canned or prepackaged foods. ? Food that is high in trans fat, such as fried foods. ? Food that is high in saturated fat, such as fatty meat. ? Sweets, desserts, sugary drinks, and other foods with added sugar. ? Full-fat dairy products.  Do not salt foods before eating.  Try to eat at least 2 vegetarian meals each week.  Eat more home-cooked food and less restaurant, buffet, and fast food.  When eating at a restaurant, ask that your food be prepared with less salt or no salt, if possible. What foods are recommended? The items listed may not be a complete list. Talk with your dietitian about   what dietary choices are best for you. Grains Whole-grain or whole-wheat bread. Whole-grain or whole-wheat pasta. Brown rice. Oatmeal. Quinoa. Bulgur. Whole-grain and low-sodium cereals. Pita bread. Low-fat, low-sodium crackers. Whole-wheat flour tortillas. Vegetables Fresh or frozen vegetables (raw, steamed, roasted, or grilled). Low-sodium or reduced-sodium tomato and vegetable juice. Low-sodium or reduced-sodium tomato sauce and tomato paste. Low-sodium or reduced-sodium canned vegetables. Fruits All fresh, dried, or frozen fruit. Canned fruit in natural juice (without  added sugar). Meat and other protein foods Skinless chicken or turkey. Ground chicken or turkey. Pork with fat trimmed off. Fish and seafood. Egg whites. Dried beans, peas, or lentils. Unsalted nuts, nut butters, and seeds. Unsalted canned beans. Lean cuts of beef with fat trimmed off. Low-sodium, lean deli meat. Dairy Low-fat (1%) or fat-free (skim) milk. Fat-free, low-fat, or reduced-fat cheeses. Nonfat, low-sodium ricotta or cottage cheese. Low-fat or nonfat yogurt. Low-fat, low-sodium cheese. Fats and oils Soft margarine without trans fats. Vegetable oil. Low-fat, reduced-fat, or light mayonnaise and salad dressings (reduced-sodium). Canola, safflower, olive, soybean, and sunflower oils. Avocado. Seasoning and other foods Herbs. Spices. Seasoning mixes without salt. Unsalted popcorn and pretzels. Fat-free sweets. What foods are not recommended? The items listed may not be a complete list. Talk with your dietitian about what dietary choices are best for you. Grains Baked goods made with fat, such as croissants, muffins, or some breads. Dry pasta or rice meal packs. Vegetables Creamed or fried vegetables. Vegetables in a cheese sauce. Regular canned vegetables (not low-sodium or reduced-sodium). Regular canned tomato sauce and paste (not low-sodium or reduced-sodium). Regular tomato and vegetable juice (not low-sodium or reduced-sodium). Pickles. Olives. Fruits Canned fruit in a light or heavy syrup. Fried fruit. Fruit in cream or butter sauce. Meat and other protein foods Fatty cuts of meat. Ribs. Fried meat. Bacon. Sausage. Bologna and other processed lunch meats. Salami. Fatback. Hotdogs. Bratwurst. Salted nuts and seeds. Canned beans with added salt. Canned or smoked fish. Whole eggs or egg yolks. Chicken or turkey with skin. Dairy Whole or 2% milk, cream, and half-and-half. Whole or full-fat cream cheese. Whole-fat or sweetened yogurt. Full-fat cheese. Nondairy creamers. Whipped toppings.  Processed cheese and cheese spreads. Fats and oils Butter. Stick margarine. Lard. Shortening. Ghee. Bacon fat. Tropical oils, such as coconut, palm kernel, or palm oil. Seasoning and other foods Salted popcorn and pretzels. Onion salt, garlic salt, seasoned salt, table salt, and sea salt. Worcestershire sauce. Tartar sauce. Barbecue sauce. Teriyaki sauce. Soy sauce, including reduced-sodium. Steak sauce. Canned and packaged gravies. Fish sauce. Oyster sauce. Cocktail sauce. Horseradish that you find on the shelf. Ketchup. Mustard. Meat flavorings and tenderizers. Bouillon cubes. Hot sauce and Tabasco sauce. Premade or packaged marinades. Premade or packaged taco seasonings. Relishes. Regular salad dressings. Where to find more information:  National Heart, Lung, and Blood Institute: www.nhlbi.nih.gov  American Heart Association: www.heart.org Summary  The DASH eating plan is a healthy eating plan that has been shown to reduce high blood pressure (hypertension). It may also reduce your risk for type 2 diabetes, heart disease, and stroke.  With the DASH eating plan, you should limit salt (sodium) intake to 2,300 mg a day. If you have hypertension, you may need to reduce your sodium intake to 1,500 mg a day.  When on the DASH eating plan, aim to eat more fresh fruits and vegetables, whole grains, lean proteins, low-fat dairy, and heart-healthy fats.  Work with your health care provider or diet and nutrition specialist (dietitian) to adjust your eating plan to your   individual calorie needs. This information is not intended to replace advice given to you by your health care provider. Make sure you discuss any questions you have with your health care provider. Document Revised: 09/12/2017 Document Reviewed: 09/23/2016 Elsevier Patient Education  2020 Elsevier Inc.  

## 2020-06-23 NOTE — Assessment & Plan Note (Signed)
Encouraged heart healthy diet, increase exercise, avoid trans fats, consider a krill oil cap daily 

## 2020-06-23 NOTE — Progress Notes (Signed)
   Covid-19 Vaccination Clinic  Name:  Kimberly Edwards    MRN: [id number] DOB: [birth year]  06/23/2020  [name] was observed post Covid-19 immunization for 15 minutes without incident. She was provided with Vaccine Information Sheet and instruction to access the V-Safe system.   [name] was instructed to call [phone] with any severe reactions post vaccine: [name] Kitchen Difficulty breathing  . Swelling of face and throat  . A fast heartbeat  . A bad rash all over body  . Dizziness and weakness

## 2020-06-24 LAB — COMPREHENSIVE METABOLIC PANEL
AG Ratio: 1.5 (calc) (ref 1.0–2.5)
ALT: 11 U/L (ref 6–29)
AST: 18 U/L (ref 10–35)
Albumin: 4.2 g/dL (ref 3.6–5.1)
Alkaline phosphatase (APISO): 62 U/L (ref 37–153)
BUN/Creatinine Ratio: 37 (calc) — ABNORMAL HIGH (ref 6–22)
BUN: 31 mg/dL — ABNORMAL HIGH (ref 7–25)
CO2: 28 mmol/L (ref 20–32)
Calcium: 9.8 mg/dL (ref 8.6–10.4)
Chloride: 105 mmol/L (ref 98–110)
Creat: 0.83 mg/dL (ref 0.60–0.88)
Globulin: 2.8 g/dL (calc) (ref 1.9–3.7)
Glucose, Bld: 88 mg/dL (ref 65–99)
Potassium: 4.7 mmol/L (ref 3.5–5.3)
Sodium: 140 mmol/L (ref 135–146)
Total Bilirubin: 0.6 mg/dL (ref 0.2–1.2)
Total Protein: 7 g/dL (ref 6.1–8.1)

## 2020-06-24 LAB — LIPID PANEL
Cholesterol: 173 mg/dL (ref <200)
HDL: 58 mg/dL (ref >=50)
LDL Cholesterol (Calc): 92 mg/dL (calc)
Non-HDL Cholesterol (Calc): 115 mg/dL (calc) (ref <130)
Total CHOL/HDL Ratio: 3 (calc) (ref <5.0)
Triglycerides: 135 mg/dL (ref <150)

## 2020-06-26 ENCOUNTER — Other Ambulatory Visit: Payer: Self-pay | Admitting: Family Medicine

## 2020-06-26 DIAGNOSIS — Z1231 Encounter for screening mammogram for malignant neoplasm of breast: Secondary | ICD-10-CM

## 2020-06-29 ENCOUNTER — Telehealth: Payer: Self-pay | Admitting: Pharmacist

## 2020-06-29 DIAGNOSIS — Z961 Presence of intraocular lens: Secondary | ICD-10-CM | POA: Diagnosis not present

## 2020-06-29 DIAGNOSIS — H04123 Dry eye syndrome of bilateral lacrimal glands: Secondary | ICD-10-CM | POA: Diagnosis not present

## 2020-06-29 NOTE — Progress Notes (Addendum)
Chronic Care Management Pharmacy Assistant   Name: Kimberly Edwards  MRN: [id number] DOB: 1941-05-24  Reason for Encounter: Medication Review  Patient Questions:  1.  Have you seen any other providers since your last visit? No  2.  Any changes in your medicines or health? No  PCP : Kimberly Held, DO   Their chronic conditions include: HTN, HLD, GAD, Osteoarthritis  Allergies:   Allergies  Allergen Reactions  . Oxycodone Nausea And Vomiting    Medications: Outpatient Encounter Medications as of 06/29/2020  Medication Sig  . amoxicillin (AMOXIL) 500 MG capsule TAKE 4 CAPSULES BY MOUTH BEFORE DENTAL PROCEDURES  . aspirin 81 MG tablet Take 81 mg by mouth daily.  . citalopram (CELEXA) 10 MG tablet Take 1 tablet (10 mg total) by mouth daily.  [name] Kitchen diltiazem (CARDIZEM CD) 300 MG 24 hr capsule TAKE 1 CAPSULE BY MOUTH EVERY DAY  . fish oil-omega-3 fatty acids 1000 MG capsule Take 2 g by mouth daily.   [name] Kitchen GLUCOSAMINE-CHONDROITIN PO Take 1 tablet by mouth daily. 1500mg  of glucosamine  . lisinopril (ZESTRIL) 30 MG tablet Take 1 tablet (30 mg total) by mouth daily.  . meloxicam (MOBIC) 15 MG tablet Take 1 tablet (15 mg total) by mouth daily.  . Multiple Vitamins-Minerals (MULTIVITAMIN WITH MINERALS) tablet Take 2 tablets by mouth daily. Vitafusion gummie  . NEOMYCIN-POLYMYXIN-HYDROCORTISONE (CORTISPORIN) 1 % SOLN OTIC solution Apply 1-2 drops to toe BID after soaking  . Polyethyl Glycol-Propyl Glycol (SYSTANE) 0.4-0.3 % SOLN Apply 1 drop to eye daily.   . pravastatin (PRAVACHOL) 40 MG tablet Take 1 tablet (40 mg total) by mouth daily.  [name] Kitchen triamterene-hydrochlorothiazide (DYAZIDE) 37.5-25 MG capsule Take 1 each (1 capsule total) by mouth daily.   No facility-administered encounter medications on file as of 06/29/2020.    Current Diagnosis: Patient Active Problem List   Diagnosis Date Noted  . Bilateral hearing loss 06/23/2020  . Estrogen deficiency 06/23/2020  . Fatigue 12/22/2018    . Generalized anxiety disorder 12/22/2018  . Obesity (BMI 30-39.9) 07/01/2013  . Dysuria 12/10/2012  . Nausea and vomiting 03/25/2012  . UTI (lower urinary tract infection) E Coli 03/25/2012  . Hypertension   . Arthritis   . Knee joint replacement status, left   . Right knee DJD   . HTN (hypertension) 12/09/2011  . Hyperlipidemia 12/09/2011  . Osteoarthritis 05/31/2011    Goals Addressed   None    Reviewed chart for medication changes ahead of medication coordination call.  Office visit: 06-23-20 (PCP)  Patient is presented with Kimberly Edwards for a  f/u bp and cholesterol   Pt c/o hearing loss and is asking for a referral otherwise no new complaints.  Provider placed orders for bone density and referral to Audiology.  Patient received third shot of Covid vaccination. No medication changes noted.  No  Consults or hospital visits since last CCM visit on 05-24-2020.  No medication changes indicated OR if recent visit, treatment plan here.  BP Readings from Last 3 Encounters:  06/23/20 124/70  12/21/19 124/88  06/22/19 128/80    Lab Results  Component Value Date   HGBA1C 5.6 06/08/2012     Patient obtains medications through Adherence Packaging  90 Days   Last adherence delivery included:  Citalopram 10mg  daily-Before Breakfast Meloxicam 15mg  daily-Before Breakfast Triamterene 37.5mg  daily-Before Breakfast Lisinopril 30mg  daily-Before Breakfast Diltiazem Cd 300mg  daily-Before Breakfast       Pravastatin 40mg  daily-Before Breakfast   Patient is due for next adherence  delivery on: 08-06-2020 Called patient and reviewed medications and coordinated delivery.  This delivery on 08-06-2020 to include: Citalopram 10mg  daily-Before Breakfast Meloxicam 15mg  daily-Before Breakfast Triamterene 37.5mg  daily-Before Breakfast Lisinopril 30mg  daily-Before Breakfast Diltiazem Cd 300mg  daily-Before Breakfast       Pravastatin 40mg  daily-Before  Breakfast   Confirmed delivery date of 08/06/2020, advised patient that pharmacy will contact them the morning of delivery.  Follow-Up:  Coordination of Enhanced Pharmacy Services   Kimberly Edwards, [place] [facility] [place] [phone]  Reviewed by: De Blanch, PharmD Clinical Pharmacist [place] [facility] [phone]

## 2020-07-08 IMAGING — MG MM DIGITAL SCREENING BILAT W/ TOMO W/ CAD
6 of 10 series · 6 of 30 positions shown · non-contrast
Comparison: Previous exam(s).

CLINICAL DATA: Screening.

EXAM:
DIGITAL SCREENING BILATERAL MAMMOGRAM WITH TOMO AND CAD

[L CC synth-2D (1 of 2)]
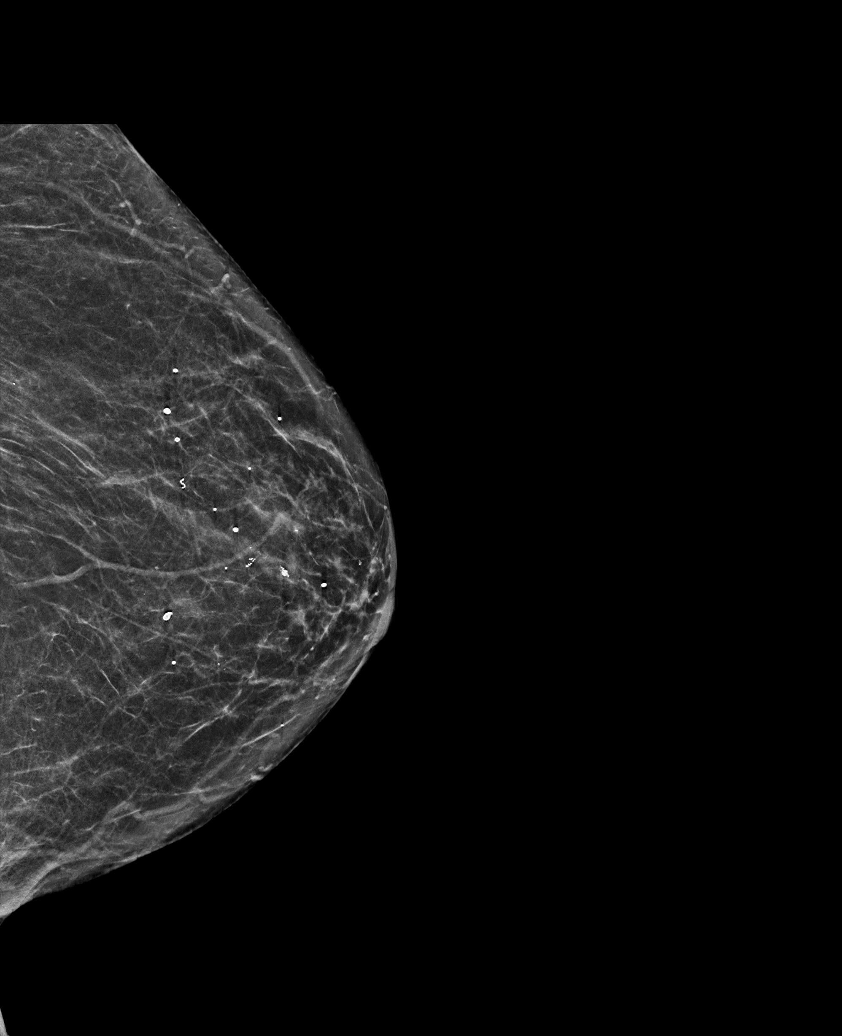

[R CC synth-2D]
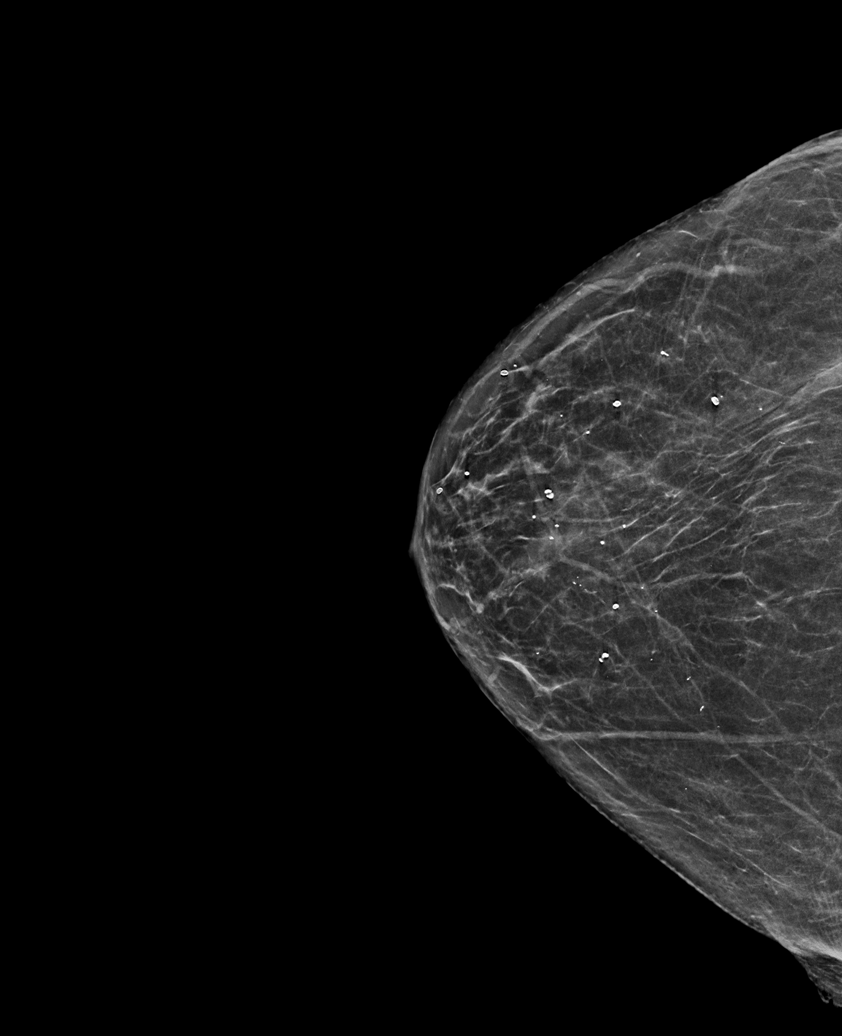

[L CC synth-2D (2 of 2)]
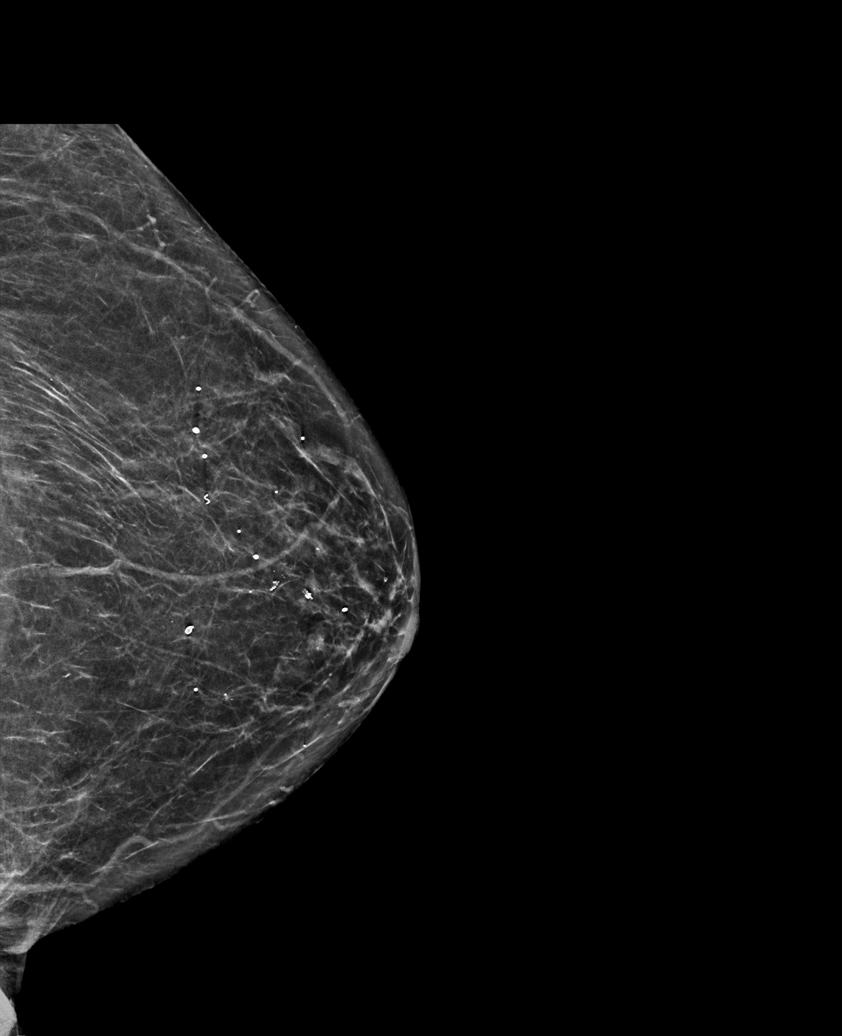

[L MLO synth-2D]
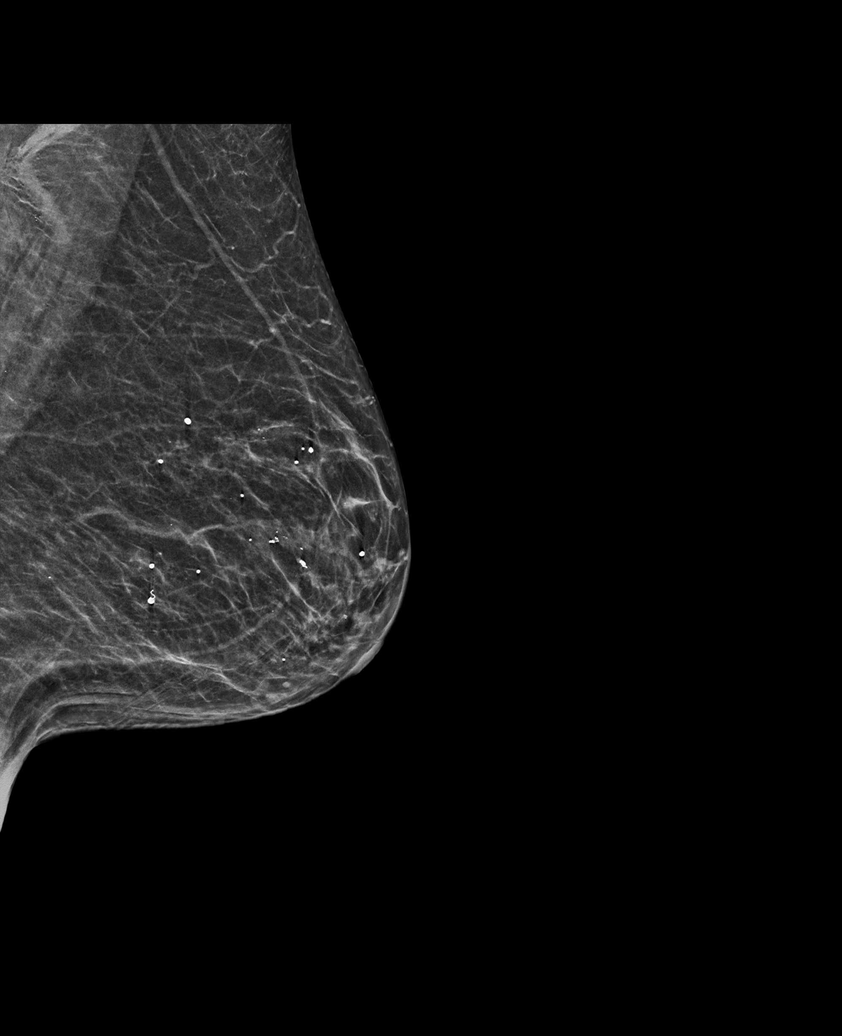

[R MLO synth-2D]
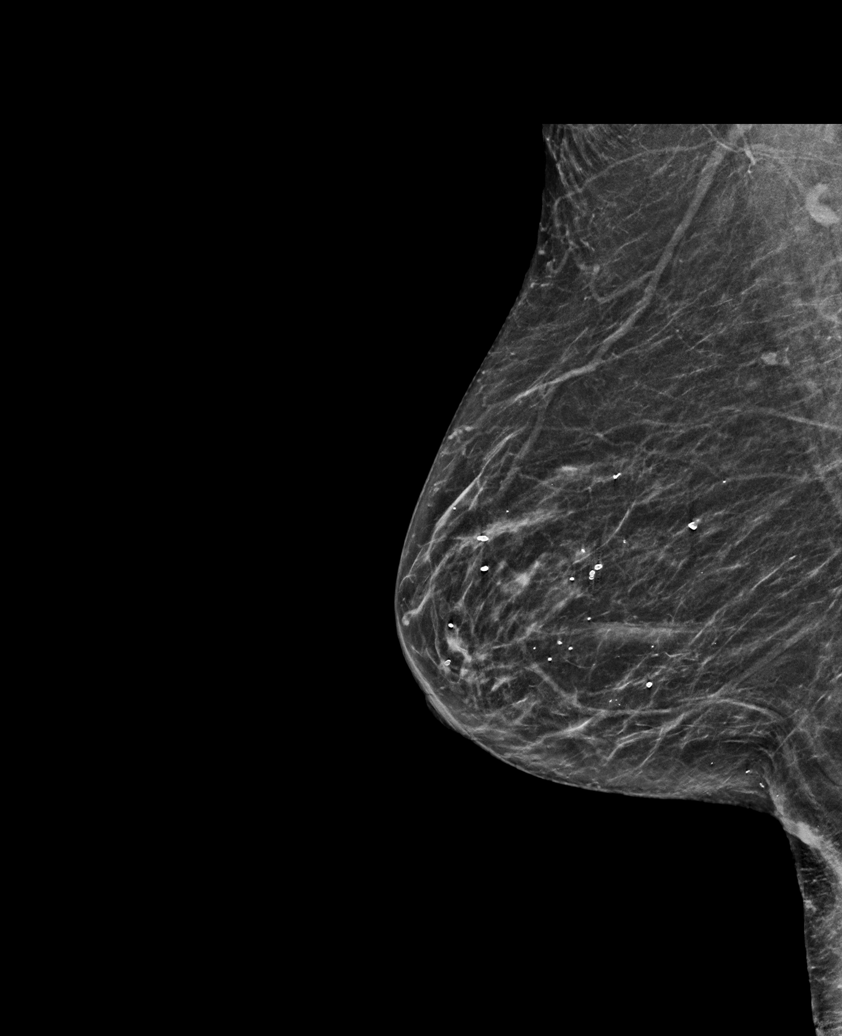

[R MLO tomo · tomo slice 29/57.0]
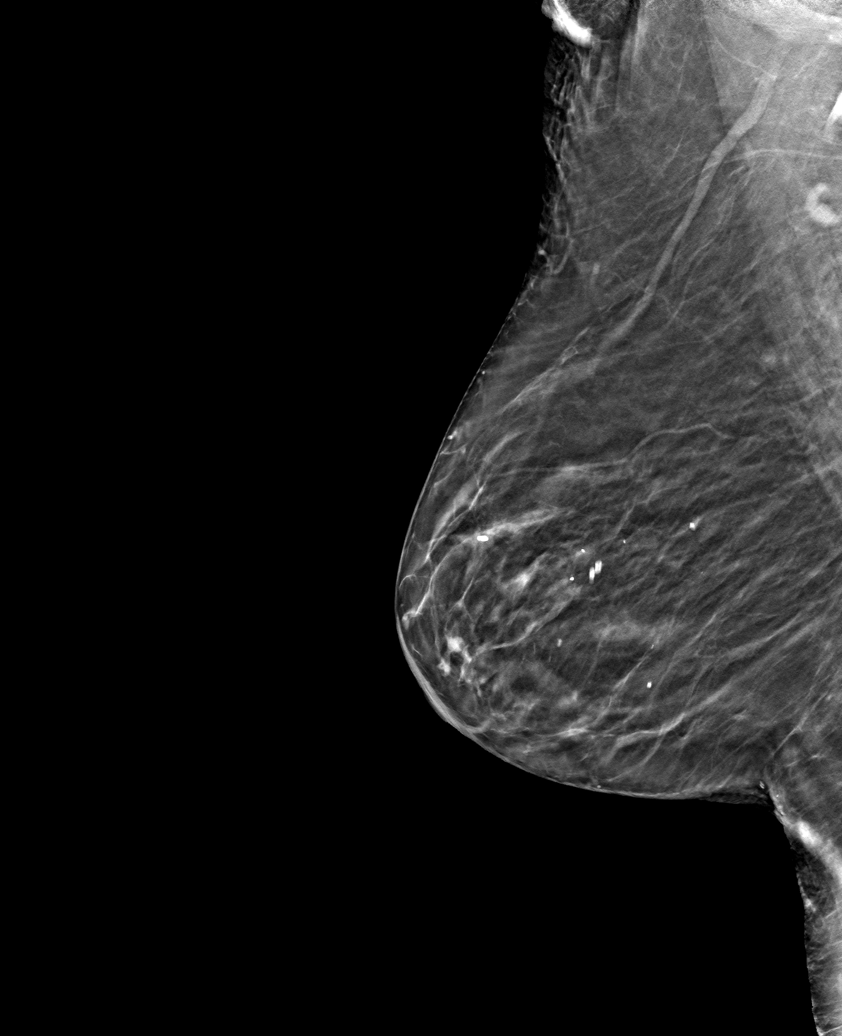

[6 of 30 positions shown; findings below may reference images not displayed]

ACR Breast Density Category b: There are scattered areas of
fibroglandular density.
FINDINGS: There are no findings suspicious for malignancy. Images were
processed with CAD.
IMPRESSION: No mammographic evidence of malignancy. A result letter of this
screening mammogram will be mailed directly to the patient.

RECOMMENDATION:
Screening mammogram in one year. (Code:[id number]-775)

BI-RADS CATEGORY  1: Negative.

## 2020-07-18 DIAGNOSIS — H903 Sensorineural hearing loss, bilateral: Secondary | ICD-10-CM | POA: Diagnosis not present

## 2020-07-31 ENCOUNTER — Telehealth: Payer: Self-pay | Admitting: Pharmacist

## 2020-07-31 ENCOUNTER — Other Ambulatory Visit: Payer: Self-pay | Admitting: Family Medicine

## 2020-07-31 DIAGNOSIS — Z96653 Presence of artificial knee joint, bilateral: Secondary | ICD-10-CM

## 2020-07-31 MED ORDER — AMOXICILLIN 500 MG PO CAPS: ORAL_CAPSULE | ORAL | 0 refills | Status: DC

## 2020-07-31 NOTE — Progress Notes (Addendum)
Chronic Care Management Pharmacy Assistant   Name: Kimberly Edwards  MRN: [id number] DOB: [birth year]  Reason for Encounter: Disease State  Patient Questions:  1.  Have you seen any other providers since your last visit? No  2.  Any changes in your medicines or health? No  PCP : Ann Held, DO  Allergies:   Allergies  Allergen Reactions  . Oxycodone Nausea And Vomiting    Medications: Outpatient Encounter Medications as of 07/31/2020  Medication Sig  . amoxicillin (AMOXIL) 500 MG capsule TAKE 4 CAPSULES BY MOUTH BEFORE DENTAL PROCEDURES  . aspirin 81 MG tablet Take 81 mg by mouth daily.  . citalopram (CELEXA) 10 MG tablet Take 1 tablet (10 mg total) by mouth daily.  [name] Kitchen diltiazem (CARDIZEM CD) 300 MG 24 hr capsule TAKE 1 CAPSULE BY MOUTH EVERY DAY  . fish oil-omega-3 fatty acids 1000 MG capsule Take 2 g by mouth daily.   [name] Kitchen GLUCOSAMINE-CHONDROITIN PO Take 1 tablet by mouth daily. 1500mg  of glucosamine  . lisinopril (ZESTRIL) 30 MG tablet Take 1 tablet (30 mg total) by mouth daily.  . meloxicam (MOBIC) 15 MG tablet Take 1 tablet (15 mg total) by mouth daily.  . Multiple Vitamins-Minerals (MULTIVITAMIN WITH MINERALS) tablet Take 2 tablets by mouth daily. Vitafusion gummie  . NEOMYCIN-POLYMYXIN-HYDROCORTISONE (CORTISPORIN) 1 % SOLN OTIC solution Apply 1-2 drops to toe BID after soaking  . Polyethyl Glycol-Propyl Glycol (SYSTANE) 0.4-0.3 % SOLN Apply 1 drop to eye daily.   . pravastatin (PRAVACHOL) 40 MG tablet Take 1 tablet (40 mg total) by mouth daily.  [name] Kitchen triamterene-hydrochlorothiazide (DYAZIDE) 37.5-25 MG capsule Take 1 each (1 capsule total) by mouth daily.   No facility-administered encounter medications on file as of 07/31/2020.    Current Diagnosis: Patient Active Problem List   Diagnosis Date Noted  . Bilateral hearing loss 06/23/2020  . Estrogen deficiency 06/23/2020  . Fatigue 12/22/2018  . Generalized anxiety disorder 12/22/2018  . Obesity (BMI 30-39.9)  07/01/2013  . Dysuria 12/10/2012  . Nausea and vomiting 03/25/2012  . UTI (lower urinary tract infection) E Coli 03/25/2012  . Hypertension   . Arthritis   . Knee joint replacement status, left   . Right knee DJD   . HTN (hypertension) 12/09/2011  . Hyperlipidemia 12/09/2011  . Osteoarthritis 05/31/2011    Goals Addressed   None    Reviewed chart for medication changes ahead of medication coordination call.  No OVs, Consults, or hospital visits since last care coordination call/Pharmacist visit.   No medication changes indicated OR if recent visit, treatment plan here.  BP Readings from Last 3 Encounters:  06/23/20 124/70  12/21/19 124/88  06/22/19 128/80    Lab Results  Component Value Date   HGBA1C 5.6 06/08/2012     Patient obtains medications through Adherence Packaging  90 Days   Last adherence delivery included:  Citalopram 10mg  daily-Before Breakfast Meloxicam 15mg  daily-Before Breakfast Triamterene 37.5mg  daily-Before Breakfast Lisinopril 30mg  daily-Before Breakfast Diltiazem Cd 300mg  daily-Before Breakfast       Pravastatin 40mg  daily-Before Breakfast  Patient is due for next adherence delivery on: 08/07/2020 Called patient and reviewed medications and coordinated delivery.  This delivery to include: Citalopram 10mg  daily-Before Breakfast Meloxicam 15mg  daily-Before Breakfast Triamterene-HCTZ 37.5-25 mg daily-Before Breakfast Lisinopril 30mg  daily-Before Breakfast Diltiazem Cd 300mg  daily-Before Breakfast       Pravastatin 40mg  daily-Before Breakfast  Patient needs refills for Triamterene-HCTZ 37.5-25 mg will reach out to PCP nurse.  Confirmed delivery date of 08/07/20, advised  patient that pharmacy will contact them the morning of delivery.  Follow-Up:  Pharmacist Review   [name], [place] [facility] [place] Pharmacist Assistant [phone]  Reviewed by: De Blanch, PharmD Clinical  Pharmacist [facility] [phone]

## 2020-08-02 ENCOUNTER — Other Ambulatory Visit: Payer: Self-pay

## 2020-08-02 DIAGNOSIS — I10 Essential (primary) hypertension: Secondary | ICD-10-CM

## 2020-08-02 MED ORDER — TRIAMTERENE-HCTZ 37.5-25 MG PO CAPS: 1.0000 | ORAL_CAPSULE | Freq: Every day | ORAL | 1 refills | Status: DC

## 2020-08-09 ENCOUNTER — Ambulatory Visit
Admission: RE | Admit: 2020-08-09 | Discharge: 2020-08-09 | Disposition: A | Discharge status: Home / Self Care | Payer: PPO | Source: Ambulatory Visit | Attending: Family Medicine | Admitting: Family Medicine

## 2020-08-09 ENCOUNTER — Other Ambulatory Visit: Payer: Self-pay

## 2020-08-09 DIAGNOSIS — Z1231 Encounter for screening mammogram for malignant neoplasm of breast: Secondary | ICD-10-CM

## 2020-08-28 ENCOUNTER — Telehealth: Payer: Self-pay | Admitting: Pharmacist

## 2020-08-28 NOTE — Progress Notes (Addendum)
Chronic Care Management Pharmacy Assistant   Name: Kimberly Edwards  MRN: [id number] DOB: 04/07/1941  Reason for Encounter: Medication Review  Patient Questions:  1.  Have you seen any other providers since your last visit? No  2.  Any changes in your medicines or health? No  PCP : Ann Held, DO  Their chronic conditions include: HTN, HLD, GAD, Osteoarthritis  Allergies:   Allergies  Allergen Reactions  . Oxycodone Nausea And Vomiting    Medications: Outpatient Encounter Medications as of 08/28/2020  Medication Sig  . amoxicillin (AMOXIL) 500 MG capsule TAKE 4 CAPSULES BY MOUTH BEFORE DENTAL PROCEDURES  . aspirin 81 MG tablet Take 81 mg by mouth daily.  . citalopram (CELEXA) 10 MG tablet Take 1 tablet (10 mg total) by mouth daily.  [name] Kitchen diltiazem (CARDIZEM CD) 300 MG 24 hr capsule TAKE 1 CAPSULE BY MOUTH EVERY DAY  . fish oil-omega-3 fatty acids 1000 MG capsule Take 2 g by mouth daily.   [name] Kitchen GLUCOSAMINE-CHONDROITIN PO Take 1 tablet by mouth daily. 1500mg  of glucosamine  . lisinopril (ZESTRIL) 30 MG tablet Take 1 tablet (30 mg total) by mouth daily.  . meloxicam (MOBIC) 15 MG tablet Take 1 tablet (15 mg total) by mouth daily.  . Multiple Vitamins-Minerals (MULTIVITAMIN WITH MINERALS) tablet Take 2 tablets by mouth daily. Vitafusion gummie  . NEOMYCIN-POLYMYXIN-HYDROCORTISONE (CORTISPORIN) 1 % SOLN OTIC solution Apply 1-2 drops to toe BID after soaking  . Polyethyl Glycol-Propyl Glycol (SYSTANE) 0.4-0.3 % SOLN Apply 1 drop to eye daily.   . pravastatin (PRAVACHOL) 40 MG tablet Take 1 tablet (40 mg total) by mouth daily.  [name] Kitchen triamterene-hydrochlorothiazide (DYAZIDE) 37.5-25 MG capsule Take 1 each (1 capsule total) by mouth daily.   No facility-administered encounter medications on file as of 08/28/2020.    Current Diagnosis: Patient Active Problem List   Diagnosis Date Noted  . Bilateral hearing loss 06/23/2020  . Estrogen deficiency 06/23/2020  . Fatigue 12/22/2018   . Generalized anxiety disorder 12/22/2018  . Obesity (BMI 30-39.9) 07/01/2013  . Dysuria 12/10/2012  . Nausea and vomiting 03/25/2012  . UTI (lower urinary tract infection) E Coli 03/25/2012  . Hypertension   . Arthritis   . Knee joint replacement status, left   . Right knee DJD   . HTN (hypertension) 12/09/2011  . Hyperlipidemia 12/09/2011  . Osteoarthritis 05/31/2011    Goals Addressed   None     No OVs, Consults, or hospital visits since last care coordination call.  No medication changes indicated.  BP Readings from Last 3 Encounters:  06/23/20 124/70  12/21/19 124/88  06/22/19 128/80    Lab Results  Component Value Date   HGBA1C 5.6 06/08/2012     Patient obtains medications through Adherence Packaging  90 Days   Last adherence delivery included: Citalopram 10mg  daily-Before Breakfast Meloxicam 15mg  daily-Before Breakfast Triamterene-HCTZ 37.5-25 mg daily-Before Breakfast Lisinopril 30mg  daily-Before Breakfast Diltiazem Cd 300mg  daily-Before Breakfast Pravastatin 40mg  daily-Before Breakfast  Patient declined no medication last month.  Patient is due for next adherence delivery on: No filled needed due to 90-day delivery on 08/07/20  Called patient and reviewed medications and coordinated delivery.  No delivery at this time due to 90-day supply on 08-07-2020.   Patient declined the following medications  due to 90-day supply on 08-07-20. Citalopram 10mg  daily-Before Breakfast Meloxicam 15mg  daily-Before Breakfast Triamterene-HCTZ 37.5-25 mg daily-Before Breakfast Lisinopril 30mg  daily-Before Breakfast Diltiazem Cd 300mg  daily-Before Breakfast Pravastatin 40mg  daily-Before Breakfast  Patient needs no refills.  Confirmed delivery date of No fill needed at this time.  Follow-Up:  Pharmacist Review   [name], [place] [facility] [place] Pharmacist Assistant [phone]  Reviewed  by: De Blanch, PharmD Clinical Pharmacist [place] [facility] [phone]

## 2020-09-25 ENCOUNTER — Encounter: Payer: Self-pay | Admitting: Family Medicine

## 2020-09-25 ENCOUNTER — Ambulatory Visit
Admission: RE | Admit: 2020-09-25 | Discharge: 2020-09-25 | Disposition: A | Discharge status: Home / Self Care | Payer: PPO | Source: Ambulatory Visit | Attending: Family Medicine | Admitting: Family Medicine

## 2020-09-25 ENCOUNTER — Other Ambulatory Visit: Payer: Self-pay

## 2020-09-25 DIAGNOSIS — M85851 Other specified disorders of bone density and structure, right thigh: Secondary | ICD-10-CM | POA: Diagnosis not present

## 2020-09-25 DIAGNOSIS — E2839 Other primary ovarian failure: Secondary | ICD-10-CM

## 2020-09-25 DIAGNOSIS — Z780 Asymptomatic menopausal state: Secondary | ICD-10-CM | POA: Diagnosis not present

## 2020-09-28 ENCOUNTER — Telehealth: Payer: Self-pay | Admitting: Pharmacist

## 2020-09-28 NOTE — Progress Notes (Addendum)
Chronic Care Management Pharmacy Assistant   Name: Jimmi Sidener  MRN: [id number] DOB: 08/07/1941  Reason for Encounter: Disease State and Medication Review   PCP : Ann Held, DO   Their chronic conditions include: HTN, HLD, GAD, Osteoarthritis  Allergies:   Allergies  Allergen Reactions   Oxycodone Nausea And Vomiting    Medications: Outpatient Encounter Medications as of 09/28/2020  Medication Sig   amoxicillin (AMOXIL) 500 MG capsule TAKE 4 CAPSULES BY MOUTH BEFORE DENTAL PROCEDURES   aspirin 81 MG tablet Take 81 mg by mouth daily.   citalopram (CELEXA) 10 MG tablet Take 1 tablet (10 mg total) by mouth daily.   diltiazem (CARDIZEM CD) 300 MG 24 hr capsule TAKE 1 CAPSULE BY MOUTH EVERY DAY   fish oil-omega-3 fatty acids 1000 MG capsule Take 2 g by mouth daily.    GLUCOSAMINE-CHONDROITIN PO Take 1 tablet by mouth daily. 1500mg  of glucosamine   lisinopril (ZESTRIL) 30 MG tablet Take 1 tablet (30 mg total) by mouth daily.   meloxicam (MOBIC) 15 MG tablet Take 1 tablet (15 mg total) by mouth daily.   Multiple Vitamins-Minerals (MULTIVITAMIN WITH MINERALS) tablet Take 2 tablets by mouth daily. Vitafusion gummie   NEOMYCIN-POLYMYXIN-HYDROCORTISONE (CORTISPORIN) 1 % SOLN OTIC solution Apply 1-2 drops to toe BID after soaking   Polyethyl Glycol-Propyl Glycol (SYSTANE) 0.4-0.3 % SOLN Apply 1 drop to eye daily.    pravastatin (PRAVACHOL) 40 MG tablet Take 1 tablet (40 mg total) by mouth daily.   triamterene-hydrochlorothiazide (DYAZIDE) 37.5-25 MG capsule Take 1 each (1 capsule total) by mouth daily.   No facility-administered encounter medications on file as of 09/28/2020.    Current Diagnosis: Patient Active Problem List   Diagnosis Date Noted   Bilateral hearing loss 06/23/2020   Estrogen deficiency 06/23/2020   Fatigue 12/22/2018   Generalized anxiety disorder 12/22/2018   Obesity (BMI 30-39.9) 07/01/2013   Dysuria 12/10/2012   Nausea and vomiting  03/25/2012   UTI (lower urinary tract infection) E Coli 03/25/2012   Hypertension    Arthritis    Knee joint replacement status, left    Right knee DJD    HTN (hypertension) 12/09/2011   Hyperlipidemia 12/09/2011   Osteoarthritis 05/31/2011    Goals Addressed   None    Reviewed chart for medication changes ahead of medication coordination call.  No OVs, Consults, or hospital visits since last care coordination call.  No medication changes indicated  BP Readings from Last 3 Encounters:  06/23/20 124/70  12/21/19 124/88  06/22/19 128/80    Lab Results  Component Value Date   HGBA1C 5.6 06/08/2012     Patient obtains medications through Adherence Packaging  90 Days   Last adherence delivery included:  Citalopram 10 mg tab Before Breakfast   Meloxicam 15 mg tab Before Breakfast   Triamterene-HCTZ 37.5-25 mg cap Before Breakfast  Lisinopril 30 mg tab Before Breakfast   Diltiazem Cd 300 mg cap Before Breakfast   Pravastatin 40 mg tab Before Breakfast   Patient is not due for an adherence delivery at this time. Called patient and reviewed medications.  Patient declined the following medications due to receiving a 90 day supply on 08-08-2020 Citalopram 10 mg tab Before Breakfast   Meloxicam 15 mg tab Before Breakfast   Triamterene-HCTZ 37.5-25 mg cap Before Breakfast  Lisinopril 30 mg tab Before Breakfast   Diltiazem Cd 300 mg cap Before Breakfast   Pravastatin 40 mg tab Before Breakfast  Patient does not need  any refills.  Inquired with the patient how she was feeling overall as a General Adherence tough point. Patient states she goes to the doctors for routine check ups and thankfully has not had any concerns that bring her to any physicians often. She does enjoy getting her medications packaged. Expressed how seamless her adherence deliveries have been. She does not have any issues or concerns that require attention from the CPP at this time.  Patient's next  adherence delivery is scheduled for 11-01-2020.  Follow-Up:  Pharmacist Review   Fanny Skates, [place] Pharmacist Assistant [phone]  2 minutes spent in review, coordination, and documentation.   Reviewed by: De Blanch, PharmD, BCACP Clinical Pharmacist [facility] [phone]

## 2020-10-27 ENCOUNTER — Telehealth: Payer: Self-pay | Admitting: Pharmacist

## 2020-10-27 NOTE — Progress Notes (Addendum)
Chronic Care Management Pharmacy Assistant   Name: Kimberly Edwards  MRN: [id number] DOB: May 22, 1941  Reason for Encounter: Medication Review    PCP : Ann Held, DO  Allergies:   Allergies  Allergen Reactions   Oxycodone Nausea And Vomiting    Medications: Outpatient Encounter Medications as of 10/27/2020  Medication Sig   amoxicillin (AMOXIL) 500 MG capsule TAKE 4 CAPSULES BY MOUTH BEFORE DENTAL PROCEDURES   aspirin 81 MG tablet Take 81 mg by mouth daily.   citalopram (CELEXA) 10 MG tablet Take 1 tablet (10 mg total) by mouth daily.   diltiazem (CARDIZEM CD) 300 MG 24 hr capsule TAKE 1 CAPSULE BY MOUTH EVERY DAY   fish oil-omega-3 fatty acids 1000 MG capsule Take 2 g by mouth daily.    GLUCOSAMINE-CHONDROITIN PO Take 1 tablet by mouth daily. 1500mg  of glucosamine   lisinopril (ZESTRIL) 30 MG tablet Take 1 tablet (30 mg total) by mouth daily.   meloxicam (MOBIC) 15 MG tablet Take 1 tablet (15 mg total) by mouth daily.   Multiple Vitamins-Minerals (MULTIVITAMIN WITH MINERALS) tablet Take 2 tablets by mouth daily. Vitafusion gummie   NEOMYCIN-POLYMYXIN-HYDROCORTISONE (CORTISPORIN) 1 % SOLN OTIC solution Apply 1-2 drops to toe BID after soaking   Polyethyl Glycol-Propyl Glycol (SYSTANE) 0.4-0.3 % SOLN Apply 1 drop to eye daily.    pravastatin (PRAVACHOL) 40 MG tablet Take 1 tablet (40 mg total) by mouth daily.   triamterene-hydrochlorothiazide (DYAZIDE) 37.5-25 MG capsule Take 1 each (1 capsule total) by mouth daily.   No facility-administered encounter medications on file as of 10/27/2020.    Current Diagnosis: Patient Active Problem List   Diagnosis Date Noted   Bilateral hearing loss 06/23/2020   Estrogen deficiency 06/23/2020   Fatigue 12/22/2018   Generalized anxiety disorder 12/22/2018   Obesity (BMI 30-39.9) 07/01/2013   Dysuria 12/10/2012   Nausea and vomiting 03/25/2012   UTI (lower urinary tract infection) E Coli 03/25/2012   Hypertension     Arthritis    Knee joint replacement status, left    Right knee DJD    HTN (hypertension) 12/09/2011   Hyperlipidemia 12/09/2011   Osteoarthritis 05/31/2011    Goals Addressed   None    Reviewed chart for medication changes ahead of medication coordination call.  No OVs, Consults, or hospital visits since last care coordination call.  No medication changes indicated   BP Readings from Last 3 Encounters:  06/23/20 124/70  12/21/19 124/88  06/22/19 128/80    Lab Results  Component Value Date   HGBA1C 5.6 06/08/2012     Patient obtains medications through Adherence Packaging  90 Days   Last adherence delivery included:  Citalopram 10 mg tab Before Breakfast   Meloxicam 15 mg tab Before Breakfast   Triamterene-HCTZ 37.5-25 mg cap Before Breakfast  Lisinopril 30 mg tab Before Breakfast   Diltiazem Cd 300 mg cap Before Breakfast   Pravastatin 40 mg tab Before Breakfast   Patient declined the following medications last month due to receiving a 90 day supply on 08-08-2020 Citalopram 10 mg tab Before Breakfast   Meloxicam 15 mg tab Before Breakfast   Triamterene-HCTZ 37.5-25 mg cap Before Breakfast  Lisinopril 30 mg tab Before Breakfast   Diltiazem Cd 300 mg cap Before Breakfast   Pravastatin 40 mg tab Before Breakfast   Patient is due for next adherence delivery on: 11-04-2020. Called patient and reviewed medications and coordinated delivery.  This delivery to include: Citalopram 10 mg tab Before Breakfast  Meloxicam 15 mg tab Before Breakfast   Triamterene-HCTZ 37.5-25 mg cap Before Breakfast  Lisinopril 30 mg tab Before Breakfast   Diltiazem Cd 300 mg cap Before Breakfast   Pravastatin 40 mg tab Before Breakfast   Patient did not decline any medications.  Patient does not needs any refills .  Confirmed delivery date of 11-04-2020, advised patient that pharmacy will contact them the morning of delivery.  Follow-Up:  Coordination of Enhanced Pharmacy Services  and Pharmacist Review   Fanny Skates, [place] Pharmacist Assistant [phone]  6 minutes spent in review, coordination, and documentation.  Reviewed by: Beverly Milch, PharmD Clinical Pharmacist [facility] [phone]

## 2020-11-13 ENCOUNTER — Ambulatory Visit: Payer: Self-pay | Admitting: Pharmacist

## 2020-11-13 NOTE — Chronic Care Management (AMB) (Signed)
  Chronic Care Management   Follow Up Note   11/13/2020 Name: Reesha Debes MRN: [id number] DOB: [birth year]  Referred by: Ann Held, DO Reason for referral : No chief complaint on file.   Kynisha Memon is a 80 y.o. year old female who is a primary care patient of Ann Held, DO. The CCM team was consulted for assistance with chronic disease management and care coordination needs.    Review of patient status, including review of consultants reports, relevant laboratory and other test results, and collaboration with appropriate care team members and the patient's provider was performed as part of comprehensive patient evaluation and provision of chronic care management services.    SDOH (Social Determinants of Health) assessments performed: No See Care Plan activities for detailed interventions related to [facility])     Outpatient Encounter Medications as of 11/13/2020  Medication Sig  . amoxicillin (AMOXIL) 500 MG capsule TAKE 4 CAPSULES BY MOUTH BEFORE DENTAL PROCEDURES  . aspirin 81 MG tablet Take 81 mg by mouth daily.  . citalopram (CELEXA) 10 MG tablet Take 1 tablet (10 mg total) by mouth daily.  [name] Kitchen diltiazem (CARDIZEM CD) 300 MG 24 hr capsule TAKE 1 CAPSULE BY MOUTH EVERY DAY  . fish oil-omega-3 fatty acids 1000 MG capsule Take 2 g by mouth daily.   [name] Kitchen GLUCOSAMINE-CHONDROITIN PO Take 1 tablet by mouth daily. 1520m of glucosamine  . lisinopril (ZESTRIL) 30 MG tablet Take 1 tablet (30 mg total) by mouth daily.  . meloxicam (MOBIC) 15 MG tablet Take 1 tablet (15 mg total) by mouth daily.  . Multiple Vitamins-Minerals (MULTIVITAMIN WITH MINERALS) tablet Take 2 tablets by mouth daily. Vitafusion gummie  . NEOMYCIN-POLYMYXIN-HYDROCORTISONE (CORTISPORIN) 1 % SOLN OTIC solution Apply 1-2 drops to toe BID after soaking  . Polyethyl Glycol-Propyl Glycol (SYSTANE) 0.4-0.3 % SOLN Apply 1 drop to eye daily.   . pravastatin (PRAVACHOL) 40 MG tablet Take 1 tablet (40 mg total) by  mouth daily.  .[name] Kitchentriamterene-hydrochlorothiazide (DYAZIDE) 37.5-25 MG capsule Take 1 each (1 capsule total) by mouth daily.   No facility-administered encounter medications on file as of 11/13/2020.     Objective:  Chart review to identify care gaps and analyze adherence data.  Goals Addressed   None     There are no care plans to display for this patient.  Care Gaps Identified: - No AWV performed, all other care gaps met  Adherence; - Patient is 100% adherent with CHOL (Pravastatin 465m with 0 days missed - Patient is 100% adherent with HTN (Lisinopril 303mwith 0 days missed - She meets 5 star category for each adherence measure, continue current medication management strategies.  Plan:   The patient has been provided with contact information for the care management team and has been advised to call with any health related questions or concerns.    ChrBeverly MilchharmD Clinical Pharmacist [phone]

## 2020-11-17 NOTE — Progress Notes (Signed)
Chronic Care Management Pharmacy Note  11/21/2020 Name:  Kimberly Edwards MRN:  [id number] DOB:  02-19-1941  Subjective: Kimberly Edwards is an 80 y.o. year old female who is a primary patient of Ann Held, DO.  The CCM team was consulted for assistance with disease management and care coordination needs.    Engaged with patient by telephone for follow up visit in response to provider referral for pharmacy case management and/or care coordination services.   Consent to Services:  The patient was given the following information about Chronic Care Management services today, agreed to services, and gave verbal consent: 1. CCM service includes personalized support from designated clinical staff supervised by the primary care provider, including individualized plan of care and coordination with other care providers 2. 24/7 contact phone numbers for assistance for urgent and routine care needs. 3. Service will only be billed when office clinical staff spend 20 minutes or more in a month to coordinate care. 4. Only one practitioner may furnish and bill the service in a calendar month. 5.The patient may stop CCM services at any time (effective at the end of the month) by phone call to the office staff. 6. The patient will be responsible for cost sharing (co-pay) of up to 20% of the service fee (after annual deductible is met). Patient agreed to services and consent obtained.  Patient Care Team: Carollee Herter, Alferd Apa, DO as PCP - General (Family Medicine) Jari Pigg, MD as Consulting Physician (Dermatology) Elsie Saas, MD as Consulting Physician (Orthopedic Surgery) Calvert Cantor, MD as Consulting Physician (Ophthalmology) Garrel Ridgel, DPM as Consulting Physician (Podiatry) Edythe Clarity, [facility] (Pharmacist)  Recent office visits: 06/23/20 Visit with Dr. Etter Sjogren - no medication changes, bone density scan ordered.  Recent consult visits: 01/06/20: Podiatry visit w/ Dr. Milinda Pointer -  Matrixectomy completely healed. F/U PRN.  12/23/19: Podiatry visit w/ Dr. Milinda Pointer - Chemical matrixectomy. Kenalog injection. RTC 2 weeks.   Objective:  Lab Results  Component Value Date   CREATININE 0.83 06/23/2020   BUN 31 (H) 06/23/2020   GFR 70.95 12/21/2019   GFRNONAA 59 (L) 03/25/2012   GFRAA 69 (L) 03/25/2012   NA 140 06/23/2020   K 4.7 06/23/2020   CALCIUM 9.8 06/23/2020   CO2 28 06/23/2020    Lab Results  Component Value Date/Time   HGBA1C 5.6 06/08/2012 11:56 AM   GFR 70.95 12/21/2019 09:38 AM   GFR 83.23 06/22/2019 10:06 AM    Last diabetic Eye exam: No results found for: HMDIABEYEEXA  Last diabetic Foot exam: No results found for: HMDIABFOOTEX   Lab Results  Component Value Date   CHOL 173 06/23/2020   HDL 58 06/23/2020   LDLCALC 92 06/23/2020   LDLDIRECT 121.0 03/30/2015   TRIG 135 06/23/2020   CHOLHDL 3.0 06/23/2020    Hepatic Function Latest Ref Rng & Units 06/23/2020 12/21/2019 06/22/2019  Total Protein 6.1 - 8.1 g/dL 7.0 6.8 6.9  Albumin 3.5 - 5.2 g/dL - 3.9 4.1  AST 10 - 35 U/L 18 17 17   ALT 6 - 29 U/L 11 12 12   Alk Phosphatase 39 - 117 U/L - 63 63  Total Bilirubin 0.2 - 1.2 mg/dL 0.6 0.6 0.6  Bilirubin, Direct 0.0 - 0.3 mg/dL - - -    Lab Results  Component Value Date/Time   TSH 1.88 09/27/2014 09:51 AM   FREET4 0.93 09/27/2014 09:51 AM    CBC Latest Ref Rng & Units 12/22/2018 06/23/2018 06/17/2017  WBC 4.0 -  10.5 K/uL 9.8 7.8 7.7  Hemoglobin 12.0 - 15.0 g/dL 15.5(H) 15.4(H) 15.5(H)  Hematocrit 36.0 - 46.0 % 45.6 45.2 47.0(H)  Platelets 150.0 - 400.0 K/uL 293.0 251.0 238.0    No results found for: VD25OH  Clinical ASCVD: No  The ASCVD Risk score Mikey Bussing DC Jr., et al., 2013) failed to calculate for the following reasons:   The 2013 ASCVD risk score is only valid for ages 31 to 65    Depression screen PHQ 2/9 06/23/2018 06/17/2017 10/19/2015  Decreased Interest 0 0 0  Down, Depressed, Hopeless 0 0 0  PHQ - 2 Score 0 0 0      Social History    Tobacco Use  Smoking Status Former Smoker  . Packs/day: 0.10  . Years: 50.00  . Pack years: 5.00  . Types: Cigarettes  . Quit date: 03/23/2012  . Years since quitting: 8.6  Smokeless Tobacco Never Used  Tobacco Comment   social and weekends alcohol   BP Readings from Last 3 Encounters:  06/23/20 124/70  12/21/19 124/88  06/22/19 128/80   Pulse Readings from Last 3 Encounters:  06/23/20 67  12/21/19 68  06/22/19 62   Wt Readings from Last 3 Encounters:  06/23/20 153 lb (69.4 kg)  12/21/19 148 lb (67.1 kg)  06/22/19 151 lb 8 oz (68.7 kg)    Assessment/Interventions: Review of patient past medical history, allergies, medications, health status, including review of consultants reports, laboratory and other test data, was performed as part of comprehensive evaluation and provision of chronic care management services.   SDOH:  (Social Determinants of Health) assessments and interventions performed:   CCM Care Plan  Allergies  Allergen Reactions  . Oxycodone Nausea And Vomiting    Medications Reviewed Today    Reviewed by Edythe Clarity, [facility] (Pharmacist) on 11/21/20 at 0925  Med List Status: <None>  Medication Order Taking? Sig Documenting Provider Last Dose Status Informant  amoxicillin (AMOXIL) 500 MG capsule [id number] Yes TAKE 4 CAPSULES BY MOUTH BEFORE DENTAL PROCEDURES Ann Held, DO Taking Active   aspirin 81 MG tablet [id number] Yes Take 81 mg by mouth daily. [provider] Taking Active Self  citalopram (CELEXA) 10 MG tablet [id number] Yes Take 1 tablet (10 mg total) by mouth daily. Carollee Herter, Kendrick Fries R, DO Taking Active   diltiazem (CARDIZEM CD) 300 MG 24 hr capsule [id number] Yes TAKE 1 CAPSULE BY MOUTH EVERY DAY Carollee Herter, Alferd Apa, DO Taking Active   fish oil-omega-3 fatty acids 1000 MG capsule [id number] Yes Take 2 g by mouth daily.  [provider] Taking Active   GLUCOSAMINE-CHONDROITIN PO [id number] Yes Take 1 tablet by mouth  daily. $Remov'1500mg'viTCqG$  of glucosamine [provider] Taking Active   lisinopril (ZESTRIL) 30 MG tablet [id number] Yes Take 1 tablet (30 mg total) by mouth daily. Ann Held, DO Taking Active   meloxicam (MOBIC) 15 MG tablet [id number] Yes Take 1 tablet (15 mg total) by mouth daily. Roma Schanz R, DO Taking Active   Multiple Vitamins-Minerals (MULTIVITAMIN WITH MINERALS) tablet [id number] Yes Take 2 tablets by mouth daily. Vitafusion gummie [provider] Taking Active Self  NEOMYCIN-POLYMYXIN-HYDROCORTISONE (CORTISPORIN) 1 % SOLN OTIC solution [id number] Yes Apply 1-2 drops to toe BID after soaking Hyatt, Max T, DPM Taking Active   Polyethyl Glycol-Propyl Glycol 0.4-0.3 % SOLN [id number] Yes Apply 1 drop to eye daily.  [provider] Taking Active   pravastatin (PRAVACHOL) 40 MG tablet [id number] Yes Take 1 tablet (  40 mg total) by mouth daily. Roma Schanz R, DO Taking Active   triamterene-hydrochlorothiazide (DYAZIDE) 37.5-25 MG capsule [id number] Yes Take 1 each (1 capsule total) by mouth daily. Ann Held, DO Taking Active           Patient Active Problem List   Diagnosis Date Noted  . Bilateral hearing loss 06/23/2020  . Estrogen deficiency 06/23/2020  . Fatigue 12/22/2018  . Generalized anxiety disorder 12/22/2018  . Obesity (BMI 30-39.9) 07/01/2013  . Dysuria 12/10/2012  . Nausea and vomiting 03/25/2012  . UTI (lower urinary tract infection) E Coli 03/25/2012  . Hypertension   . Arthritis   . Knee joint replacement status, left   . Right knee DJD   . HTN (hypertension) 12/09/2011  . Hyperlipidemia 12/09/2011  . Osteoarthritis 05/31/2011    Immunization History  Administered Date(s) Administered  . Fluad Quad(high Dose 65+) 06/22/2019  . Influenza, High Dose Seasonal PF 06/22/2014, 06/27/2015, 06/18/2016, 06/17/2017, 06/23/2018  . Influenza,inj,Quad PF,6+ Mos 07/01/2013  . Influenza-Unspecified 06/14/2014  . PFIZER(Purple  Top)SARS-COV-2 Vaccination 11/06/2019, 11/27/2019, 06/23/2020  . Pneumococcal Conjugate-13 09/29/2014  . Pneumococcal Polysaccharide-23 10/19/2015    Conditions to be addressed/monitored:  HTN, HLD, GAD, Osteoarthritis, Osteopenia  Care Plan : General Pharmacy (Adult)  Updates made by Edythe Clarity, RPH since 11/21/2020 12:00 AM    Problem: HTN, Osteopenia, HLD, GAD   Priority: High  Onset Date: 11/21/2020    Goal: Patient-Specific Goal   Start Date: 11/21/2020  Expected End Date: 05/21/2021  This Visit's Progress: On track  Priority: High  Note:   Current Barriers:  . None noted at this visit   Pharmacist Clinical Goal(s):  [name] Kitchen Over the next 180 days, patient will adhere to prescribed medication regimen as evidenced by starting Calcium + Vitamin D OTC to prevent progression to osteoporosis. through collaboration with PharmD and provider.  . Continue to monitor BP regularly at home. . Report to PCP next week for updated lab work.  Interventions: . 1:1 collaboration with Carollee Herter, Alferd Apa, DO regarding development and update of comprehensive plan of care as evidenced by provider attestation and co-signature . Inter-disciplinary care team collaboration (see longitudinal plan of care) . Comprehensive medication review performed; medication list updated in electronic medical record  Hypertension (BP goal <140/90) -controlled -Current treatment:  Diltiazem 340m daily  Lisinopril 343mdaily  Triamterene-hctz 37.5-25mg daily -Medications previously tried: none noted -Current home readings: 128-134/78-80 -Current exercise habits: she dances for about 15 minutes each morning with music from her Alexa, also reports walking a few times weekly -Denies hypotensive/hypertensive symptoms -Educated on Daily salt intake goal < 2300 mg; Exercise goal of 150 minutes per week; Importance of home blood pressure monitoring; -Counseled to monitor BP at home daily, document, and provide  log at future appointments -Counseled on diet and exercise extensively Recommended to continue current medication  Hyperlipidemia: (LDL goal < 100) -controlled -Current treatment: . Pravastatin 402maily -Medications previously tried: none noted -Current exercise habits: see above -Educated on Benefits of statin for ASCVD risk reduction; Importance of limiting foods high in cholesterol; -Recommended to continue current medication Recommended updated labwork at next f/u visit with Dr. LowEtter Sjogrenepression/Anxiety (Goal: Minimize symptoms) -controlled -Current treatment: . Citalopram 25m20medications previously tried/failed: none noted  -Educated on Benefits of medication for symptom control  - Reports mood is stable as of late, denies depressive symptoms. -Recommended to continue current medication   Osteopenia (Goal to maintain bone density, prevent fracture) -controlled -Last  DEXA Scan: 09/25/20  T-Score femoral neck: -2.1  T-Score forearm radius: -1.7  10-year probability of major osteoporotic fracture: 15.5%  10-year probability of hip fracture: 4.7% -Patient is a candidate for pharmacologic treatment due to T-Score -1.0 to -2.5 and 10-year risk of hip fracture > 3% -Current treatment  . No medications currently -Medications previously tried: none  -Recommend [phone] units of vitamin D daily. Recommend 1200 mg of calcium daily from dietary and supplemental sources. -Recommended patient begin to take the Calcium + Vitamin D recommended by PCP after most recent DEXA Educated on importance of this and risk of fracture.  Patient clinically would qualify for pharmacologic treatment with bisphosphonate due to high fracture risk but she is not interested in another prescription med at this time.  She is agreeable to start the calcium + Vitamin D after our conversation today.  Recommend repeat bone density in two years.   Patient Goals/Self-Care Activities . Over the next 180  days, patient will:  - take medications as prescribed check blood pressure daily, document, and provide at future appointments Pick up calcium + vitamin D from local pharmacy and begin taking as directed.  Follow Up Plan: The care management team will reach out to the patient again over the next 30 days.         Medication Assistance: Utilizing Enhanced Pharmacy Services through [facility] for packaging and delivery.  Patient's preferred pharmacy is:  [facility] - [place], [place] - [address] [place] [address] Phone: [phone] Fax: [phone]  Uses pill box? Yes - adherence packaging from [facility] Pt endorses 100% compliance  We discussed: Benefits of medication synchronization, packaging and delivery as well as enhanced pharmacist oversight with [facility]. Patient decided to: Utilize [facility] for medication synchronization, packaging and delivery  Follow Up:  Patient agrees to Care Plan and Follow-up.  Plan: The care management team will reach out to the patient again over the next 180 days.  Beverly Milch, PharmD Clinical Pharmacist [place] [phone]

## 2020-11-21 ENCOUNTER — Ambulatory Visit (INDEPENDENT_AMBULATORY_CARE_PROVIDER_SITE_OTHER): Payer: PPO

## 2020-11-21 DIAGNOSIS — I10 Essential (primary) hypertension: Secondary | ICD-10-CM

## 2020-11-21 DIAGNOSIS — E782 Mixed hyperlipidemia: Secondary | ICD-10-CM | POA: Diagnosis not present

## 2020-11-21 DIAGNOSIS — M8580 Other specified disorders of bone density and structure, unspecified site: Secondary | ICD-10-CM

## 2020-11-21 NOTE — Patient Instructions (Addendum)
Visit Information  Goals Addressed            This Visit's Progress   . Manage My Medicine       Timeframe:  Short-Term Goal Priority:  High Start Date:   11/21/20                          Expected End Date:   01/11/21                    Follow Up Date 01/11/21   Begin taking recommended Calcium + Vitamin D for osteopenia.    Why is this important?   . These steps will help you keep on track with your medicines.   Notes: Please contact PharmD with questions if you cannot find it.      Patient Care Plan: General Pharmacy (Adult)    Problem Identified: HTN, Osteopenia, HLD, GAD   Priority: High  Onset Date: 11/21/2020    Goal: Patient-Specific Goal   Start Date: 11/21/2020  Expected End Date: 05/21/2021  This Visit's Progress: On track  Priority: High  Note:   Current Barriers:  . None noted at this visit   Pharmacist Clinical Goal(s):  [name] Kitchen Over the next 180 days, patient will adhere to prescribed medication regimen as evidenced by starting Calcium + Vitamin D OTC to prevent progression to osteoporosis. through collaboration with PharmD and provider.  . Continue to monitor BP regularly at home. . Report to PCP next week for updated lab work.  Interventions: . 1:1 collaboration with Carollee Herter, Alferd Apa, DO regarding development and update of comprehensive plan of care as evidenced by provider attestation and co-signature . Inter-disciplinary care team collaboration (see longitudinal plan of care) . Comprehensive medication review performed; medication list updated in electronic medical record  Hypertension (BP goal <140/90) -controlled -Current treatment:  Diltiazem 300mg  daily  Lisinopril 30mg  daily  Triamterene-hctz 37.5-25mg  daily -Medications previously tried: none noted -Current home readings: 128-134/78-80 -Current exercise habits: she dances for about 15 minutes each morning with music from her [employer], also reports walking a few times weekly -Denies  hypotensive/hypertensive symptoms -Educated on Daily salt intake goal < 2300 mg; Exercise goal of 150 minutes per week; Importance of home blood pressure monitoring; -Counseled to monitor BP at home daily, document, and provide log at future appointments -Counseled on diet and exercise extensively Recommended to continue current medication  Hyperlipidemia: (LDL goal < 100) -controlled -Current treatment: . Pravastatin 40mg  daily -Medications previously tried: none noted -Current exercise habits: see above -Educated on Benefits of statin for ASCVD risk reduction; Importance of limiting foods high in cholesterol; -Recommended to continue current medication Recommended updated labwork at next f/u visit with Dr. Etter Sjogren  Depression/Anxiety (Goal: Minimize symptoms) -controlled -Current treatment: . Citalopram 10mg  -Medications previously tried/failed: none noted  -Educated on Benefits of medication for symptom control  - Reports mood is stable as of late, denies depressive symptoms. -Recommended to continue current medication   Osteopenia (Goal to maintain bone density, prevent fracture) -controlled -Last DEXA Scan: 09/25/20  T-Score femoral neck: -2.1  T-Score forearm radius: -1.7  10-year probability of major osteoporotic fracture: 15.5%  10-year probability of hip fracture: 4.7% -Patient is a candidate for pharmacologic treatment due to T-Score -1.0 to -2.5 and 10-year risk of hip fracture > 3% -Current treatment  . No medications currently -Medications previously tried: none  -Recommend [phone] units of vitamin D daily. Recommend 1200 mg of calcium daily  from dietary and supplemental sources. -Recommended patient begin to take the Calcium + Vitamin D recommended by PCP after most recent DEXA Educated on importance of this and risk of fracture.  Patient clinically would qualify for pharmacologic treatment with bisphosphonate due to high fracture risk but she is not interested  in another prescription med at this time.  She is agreeable to start the calcium + Vitamin D after our conversation today.  Recommend repeat bone density in two years.   Patient Goals/Self-Care Activities . Over the next 180 days, patient will:  - take medications as prescribed check blood pressure daily, document, and provide at future appointments Pick up calcium + vitamin D from local pharmacy and begin taking as directed.  Follow Up Plan: The care management team will reach out to the patient again over the next 30 days.         The patient verbalized understanding of instructions, educational materials, and care plan provided today and agreed to receive a mailed copy of patient instructions, educational materials, and care plan.   Telephone follow up appointment with pharmacy team member scheduled for: 4 months  Edythe Clarity, [facility]  Osteopenia  Osteopenia is a loss of thickness (density) inside the bones. Another name for osteopenia is low bone mass. Mild osteopenia is a normal part of aging. It is not a disease, and it does not cause symptoms. However, if you have osteopenia and continue to lose bone mass, you could develop a condition that causes the bones to become thin and break more easily (osteoporosis). Osteoporosis can cause you to lose some height, have back pain, and have a stooped posture. Although osteopenia is not a disease, making changes to your lifestyle and diet can help to prevent osteopenia from developing into osteoporosis. What are the causes? Osteopenia is caused by loss of calcium in the bones. Bones are constantly changing. Old bone cells are continually being replaced with new bone cells. This process builds new bone. The mineral calcium is needed to build new bone and maintain bone density. Bone density is usually highest around age 39. After that, most people's bodies cannot replace all the bone they have lost with new bone. What increases the  risk? You are more likely to develop this condition if:  You are older than age 41.  You are a woman who went through menopause early.  You have a long illness that keeps you in bed.  You do not get enough exercise.  You lack certain nutrients (malnutrition).  You have an overactive thyroid gland (hyperthyroidism).  You use products that contain nicotine or tobacco, such as cigarettes, e-cigarettes and chewing tobacco, or you drink a lot of alcohol.  You are taking medicines that weaken the bones, such as steroids. What are the signs or symptoms? This condition does not cause any symptoms. You may have a slightly higher risk for bone breaks (fractures), so getting fractures more easily than normal may be an indication of osteopenia. How is this diagnosed? This condition may be diagnosed based on an X-ray exam that measures bone density (dual-energy X-ray absorptiometry, or DEXA). This test can measure bone density in your hips, spine, and wrists. Osteopenia has no symptoms, so this condition is usually diagnosed after a routine bone density screening test is done for osteoporosis. This routine screening is usually done for:  Women who are age 33 or older.  Men who are age 25 or older. If you have risk factors for osteopenia, you may  have the screening test at an earlier age. How is this treated? Making dietary and lifestyle changes can lower your risk for osteoporosis. If you have severe osteopenia that is close to becoming osteoporosis, this condition can be treated with medicines and dietary supplements such as calcium and vitamin D. These supplements help to rebuild bone density. Follow these instructions at home: Eating and drinking Eat a diet that is high in calcium and vitamin D.  Calcium is found in dairy products, beans, salmon, and leafy green vegetables like spinach and broccoli.  Look for foods that have vitamin D and calcium added to them (fortified foods), such as  orange juice, cereal, and bread.   Lifestyle  Do 30 minutes or more of a weight-bearing exercise every day, such as walking, jogging, or playing a sport. These types of exercises strengthen the bones.  Do not use any products that contain nicotine or tobacco, such as cigarettes, e-cigarettes, and chewing tobacco. If you need help quitting, ask your health care provider.  Do not drink alcohol if: ? Your health care provider tells you not to drink. ? You are pregnant, may be pregnant, or are planning to become pregnant.  If you drink alcohol: ? Limit how much you use to:  0-1 drink a day for women.  0-2 drinks a day for men. ? Be aware of how much alcohol is in your drink. In the [place] one drink equals one 12 oz bottle of beer (355 mL), one 5 oz glass of wine (148 mL), or one 1 oz glass of hard liquor (44 mL). General instructions  Take over-the-counter and prescription medicines only as told by your health care provider. These include vitamins and supplements.  Take precautions at home to lower your risk of falling, such as: ? Keeping rooms well-lit and free of clutter, such as cords. ? Installing safety rails on stairs. ? Using rubber mats in the bathroom or other areas that are often wet or slippery.  Keep all follow-up visits. This is important. Contact a health care provider if:  You have not had a bone density screening for osteoporosis and you are: ? A woman who is age 93 or older. ? A man who is age 61 or older.  You are a postmenopausal woman who has not had a bone density screening for osteoporosis.  You are older than age 61 and you want to know if you should have bone density screening for osteoporosis. Summary  Osteopenia is a loss of thickness (density) inside the bones. Another name for osteopenia is low bone mass.  Osteopenia is not a disease, but it may increase your risk for a condition that causes the bones to become thin and break more easily  (osteoporosis).  You may be at risk for osteopenia if you are older than age 84 or if you are a woman who went through early menopause.  Osteopenia does not cause any symptoms, but it can be diagnosed with a bone density screening test.  Dietary and lifestyle changes are the first treatment for osteopenia. These may lower your risk for osteoporosis. This information is not intended to replace advice given to you by your health care provider. Make sure you discuss any questions you have with your health care provider. Document Revised: 03/16/2020 Document Reviewed: 03/16/2020 Elsevier Patient Education  [place].

## 2020-11-28 ENCOUNTER — Telehealth: Payer: Self-pay | Admitting: Pharmacist

## 2020-11-28 NOTE — Progress Notes (Addendum)
Chronic Care Management Pharmacy Assistant   Name: Kimberly Edwards  MRN: [id number] DOB: 01/07/1941  Reason for Encounter: Medication Review  PCP : Ann Held, DO  Allergies:   Allergies  Allergen Reactions   Oxycodone Nausea And Vomiting    Medications: Outpatient Encounter Medications as of 11/28/2020  Medication Sig   amoxicillin (AMOXIL) 500 MG capsule TAKE 4 CAPSULES BY MOUTH BEFORE DENTAL PROCEDURES   aspirin 81 MG tablet Take 81 mg by mouth daily.   citalopram (CELEXA) 10 MG tablet Take 1 tablet (10 mg total) by mouth daily.   diltiazem (CARDIZEM CD) 300 MG 24 hr capsule TAKE 1 CAPSULE BY MOUTH EVERY DAY   fish oil-omega-3 fatty acids 1000 MG capsule Take 2 g by mouth daily.    GLUCOSAMINE-CHONDROITIN PO Take 1 tablet by mouth daily. 1500mg  of glucosamine   lisinopril (ZESTRIL) 30 MG tablet Take 1 tablet (30 mg total) by mouth daily.   meloxicam (MOBIC) 15 MG tablet Take 1 tablet (15 mg total) by mouth daily.   Multiple Vitamins-Minerals (MULTIVITAMIN WITH MINERALS) tablet Take 2 tablets by mouth daily. Vitafusion gummie   NEOMYCIN-POLYMYXIN-HYDROCORTISONE (CORTISPORIN) 1 % SOLN OTIC solution Apply 1-2 drops to toe BID after soaking   Polyethyl Glycol-Propyl Glycol 0.4-0.3 % SOLN Apply 1 drop to eye daily.    pravastatin (PRAVACHOL) 40 MG tablet Take 1 tablet (40 mg total) by mouth daily.   triamterene-hydrochlorothiazide (DYAZIDE) 37.5-25 MG capsule Take 1 each (1 capsule total) by mouth daily.   No facility-administered encounter medications on file as of 11/28/2020.    Current Diagnosis: Patient Active Problem List   Diagnosis Date Noted   Bilateral hearing loss 06/23/2020   Estrogen deficiency 06/23/2020   Fatigue 12/22/2018   Generalized anxiety disorder 12/22/2018   Obesity (BMI 30-39.9) 07/01/2013   Dysuria 12/10/2012   Nausea and vomiting 03/25/2012   UTI (lower urinary tract infection) E Coli 03/25/2012   Hypertension    Arthritis    Knee  joint replacement status, left    Right knee DJD    HTN (hypertension) 12/09/2011   Hyperlipidemia 12/09/2011   Osteoarthritis 05/31/2011    Goals Addressed   None    Reviewed chart for medication changes ahead of medication coordination call.  No OVs, Consults, or hospital visits since last care coordination call.  No medication changes indicated.  BP Readings from Last 3 Encounters:  06/23/20 124/70  12/21/19 124/88  06/22/19 128/80    Lab Results  Component Value Date   HGBA1C 5.6 06/08/2012     Patient obtains medications through Adherence Packaging  90 Days   Last adherence delivery included:On 11/04/20 Citalopram 10 mg tab Before Breakfast   Meloxicam 15 mg tab Before Breakfast   Triamterene-HCTZ 37.5-25 mg cap Before Breakfast  Lisinopril 30 mg tab Before Breakfast   Diltiazem Cd 300 mg cap Before Breakfast   Pravastatin 40 mg tab Before Breakfast  Patient declined meds last month: None.  Patient is not due for an adherence delivery at this time.  Called patient and reviewed medications and coordinated delivery.  This delivery to include: None.  Patient declined the following medications: (90 DS on 11/04/20) Citalopram 10 mg tab Before Breakfast   Meloxicam 15 mg tab Before Breakfast   Triamterene-HCTZ 37.5-25 mg cap Before Breakfast  Lisinopril 30 mg tab Before Breakfast   Diltiazem Cd 300 mg cap Before Breakfast   Pravastatin 40 mg tab Before Breakfast  Patient does not need any refills at this  time.   Follow-Up:  Comptroller and Pharmacist Review   Charlann Lange, [place] Pharmacist Assistant [phone]  5 minutes spent in review, coordination, and documentation.  Reviewed by: Beverly Milch, PharmD Clinical Pharmacist [facility] [phone]

## 2020-12-22 ENCOUNTER — Encounter: Payer: PPO | Admitting: Family Medicine

## 2020-12-27 ENCOUNTER — Telehealth: Payer: Self-pay | Admitting: Pharmacist

## 2020-12-27 NOTE — Progress Notes (Addendum)
° ° °  Chronic Care Management Pharmacy Assistant   Name: Kimberly Edwards  MRN: [id number] DOB: 1941/04/14  Reason for Encounter: Medication Review  Medications: Outpatient Encounter Medications as of 12/27/2020  Medication Sig   amoxicillin (AMOXIL) 500 MG capsule TAKE 4 CAPSULES BY MOUTH BEFORE DENTAL PROCEDURES   aspirin 81 MG tablet Take 81 mg by mouth daily.   citalopram (CELEXA) 10 MG tablet Take 1 tablet (10 mg total) by mouth daily.   diltiazem (CARDIZEM CD) 300 MG 24 hr capsule TAKE 1 CAPSULE BY MOUTH EVERY DAY   fish oil-omega-3 fatty acids 1000 MG capsule Take 2 g by mouth daily.    GLUCOSAMINE-CHONDROITIN PO Take 1 tablet by mouth daily. 1500mg  of glucosamine   lisinopril (ZESTRIL) 30 MG tablet Take 1 tablet (30 mg total) by mouth daily.   meloxicam (MOBIC) 15 MG tablet Take 1 tablet (15 mg total) by mouth daily.   Multiple Vitamins-Minerals (MULTIVITAMIN WITH MINERALS) tablet Take 2 tablets by mouth daily. Vitafusion gummie   NEOMYCIN-POLYMYXIN-HYDROCORTISONE (CORTISPORIN) 1 % SOLN OTIC solution Apply 1-2 drops to toe BID after soaking   Polyethyl Glycol-Propyl Glycol 0.4-0.3 % SOLN Apply 1 drop to eye daily.    pravastatin (PRAVACHOL) 40 MG tablet Take 1 tablet (40 mg total) by mouth daily.   triamterene-hydrochlorothiazide (DYAZIDE) 37.5-25 MG capsule Take 1 each (1 capsule total) by mouth daily.   No facility-administered encounter medications on file as of 12/27/2020.    Reviewed chart for medication changes ahead of medication coordination call.  No OVs, Consults, or hospital visits since last care coordination call.  No medication changes indicated.  BP Readings from Last 3 Encounters:  06/23/20 124/70  12/21/19 124/88  06/22/19 128/80    Lab Results  Component Value Date   HGBA1C 5.6 06/08/2012     Patient obtains medications through Adherence Packaging  90 Days   Last adherence delivery included:(90 DS on 11/04/20) Citalopram 10 mg tab Before Breakfast    Meloxicam 15 mg tab Before Breakfast   Triamterene-HCTZ 37.5-25 mg cap Before Breakfast  Lisinopril 30 mg tab Before Breakfast   Diltiazem Cd 300 mg cap Before Breakfast   Pravastatin 40 mg tab Before Breakfast  Patient declined meds last month: None  Patient is not due for an adherence delivery at this time.  Called patient and reviewed medications and coordinated delivery.  This delivery to include: None  Patient declined the following medications: Citalopram 10 mg tab Before Breakfast   Meloxicam 15 mg tab Before Breakfast   Triamterene-HCTZ 37.5-25 mg cap Before Breakfast  Lisinopril 30 mg tab Before Breakfast   Diltiazem Cd 300 mg cap Before Breakfast   Pravastatin 40 mg tab Before Breakfast  Patient does not need any refills at this time.   Follow-Up: Pharmacist Review  Charlann Lange, RMA Clinical Pharmacist Assistant [phone]  7 minutes spent in review, coordination, and documentation.  Reviewed by: Beverly Milch, PharmD Clinical Pharmacist [facility] [phone]

## 2021-01-20 ENCOUNTER — Other Ambulatory Visit: Payer: Self-pay | Admitting: Family Medicine

## 2021-01-20 DIAGNOSIS — E782 Mixed hyperlipidemia: Secondary | ICD-10-CM

## 2021-01-20 DIAGNOSIS — I10 Essential (primary) hypertension: Secondary | ICD-10-CM

## 2021-01-20 DIAGNOSIS — M1990 Unspecified osteoarthritis, unspecified site: Secondary | ICD-10-CM

## 2021-01-22 ENCOUNTER — Other Ambulatory Visit: Payer: Self-pay

## 2021-01-22 ENCOUNTER — Telehealth: Payer: Self-pay | Admitting: Pharmacist

## 2021-01-22 DIAGNOSIS — F411 Generalized anxiety disorder: Secondary | ICD-10-CM

## 2021-01-22 MED ORDER — CITALOPRAM HYDROBROMIDE 10 MG PO TABS: 10.0000 mg | ORAL_TABLET | Freq: Every day | ORAL | 0 refills | Status: DC

## 2021-01-22 NOTE — Progress Notes (Addendum)
    Chronic Care Management Pharmacy Assistant   Name: Patsy Zaragoza  MRN: [id number] DOB: Feb 17, 1941  Reason for Encounter: Medication Review/Medication Coordination Call.  Medications: Outpatient Encounter Medications as of 01/22/2021  Medication Sig   diltiazem (CARTIA XT) 300 MG 24 hr capsule Take 1 capsule (300 mg total) by mouth daily.   meloxicam (MOBIC) 15 MG tablet Take 1 tablet (15 mg total) by mouth daily.   pravastatin (PRAVACHOL) 40 MG tablet Take 1 tablet (40 mg total) by mouth daily.   triamterene-hydrochlorothiazide (DYAZIDE) 37.5-25 MG capsule Take 1 each (1 capsule total) by mouth daily.   amoxicillin (AMOXIL) 500 MG capsule TAKE 4 CAPSULES BY MOUTH BEFORE DENTAL PROCEDURES   aspirin 81 MG tablet Take 81 mg by mouth daily.   citalopram (CELEXA) 10 MG tablet Take 1 tablet (10 mg total) by mouth daily.   fish oil-omega-3 fatty acids 1000 MG capsule Take 2 g by mouth daily.    GLUCOSAMINE-CHONDROITIN PO Take 1 tablet by mouth daily. 1500mg  of glucosamine   lisinopril (ZESTRIL) 30 MG tablet Take 1 tablet (30 mg total) by mouth daily.   Multiple Vitamins-Minerals (MULTIVITAMIN WITH MINERALS) tablet Take 2 tablets by mouth daily. Vitafusion gummie   NEOMYCIN-POLYMYXIN-HYDROCORTISONE (CORTISPORIN) 1 % SOLN OTIC solution Apply 1-2 drops to toe BID after soaking   Polyethyl Glycol-Propyl Glycol 0.4-0.3 % SOLN Apply 1 drop to eye daily.    No facility-administered encounter medications on file as of 01/22/2021.    Reviewed chart for medication changes ahead of medication coordination call.  No OVs, Consults, or hospital visits since last care coordination call.  No medication changes indicated.  BP Readings from Last 3 Encounters:  06/23/20 124/70  12/21/19 124/88  06/22/19 128/80    Lab Results  Component Value Date   HGBA1C 5.6 06/08/2012     Patient obtains medications through Adherence Packaging  90 Days   Last adherence delivery included: (90 DS  11/04/20) Citalopram 10 mg tab Before Breakfast   Meloxicam 15 mg tab Before Breakfast   Triamterene-HCTZ 37.5-25 mg cap Before Breakfast  Lisinopril 30 mg tab Before Breakfast   Diltiazem Cd 300 mg cap Before Breakfast   Pravastatin 40 mg tab Before Breakfast  Patient declined meds last month:(90 DS 11/04/20) Citalopram 10 mg tab Before Breakfast   Meloxicam 15 mg tab Before Breakfast   Triamterene-HCTZ 37.5-25 mg cap Before Breakfast  Lisinopril 30 mg tab Before Breakfast   Diltiazem Cd 300 mg cap Before Breakfast   Pravastatin 40 mg tab Before Breakfast  Patient is due for next adherence delivery on: 02/02/21  Called patient and reviewed medications and coordinated delivery.  This delivery to include: Citalopram 10 mg tab Before Breakfast   Meloxicam 15 mg tab Before Breakfast   Triamterene-HCTZ 37.5-25 mg cap Before Breakfast  Lisinopril 30 mg tab Before Breakfast   Diltiazem Cd 300 mg cap Before Breakfast   Pravastatin 40 mg tab Before Breakfast  Patient declined the following medications: None  Patient needs refills for: Lisinopril 30 mg, Citalopram 10 mg-requested.  Confirmed delivery date of 02/02/21, advised patient that pharmacy will contact them the morning of delivery.   Follow-Up:Pharmacist Review  Charlann Lange, RMA Clinical Pharmacist Assistant [phone]  10 minutes spent in review, coordination, and documentation.  Reviewed by: Beverly Milch, PharmD Clinical Pharmacist [facility] [phone]

## 2021-01-23 ENCOUNTER — Telehealth: Payer: Self-pay

## 2021-01-23 DIAGNOSIS — I10 Essential (primary) hypertension: Secondary | ICD-10-CM

## 2021-01-23 DIAGNOSIS — F411 Generalized anxiety disorder: Secondary | ICD-10-CM

## 2021-01-23 MED ORDER — LISINOPRIL 30 MG PO TABS: 30.0000 mg | ORAL_TABLET | Freq: Every day | ORAL | 1 refills | Status: DC

## 2021-01-23 MED ORDER — CITALOPRAM HYDROBROMIDE 10 MG PO TABS: 10.0000 mg | ORAL_TABLET | Freq: Every day | ORAL | 0 refills | Status: DC

## 2021-01-23 NOTE — Telephone Encounter (Signed)
-----   Message from Rosetta Posner sent at 01/22/2021  2:42 PM EDT ----- Regarding: Medication Refill Hi can you send a refill for Lisinopril 30 mg, Citalopram 10 mg to [facility]. Thank you.

## 2021-04-02 ENCOUNTER — Encounter: Payer: Self-pay | Admitting: Family Medicine

## 2021-04-02 ENCOUNTER — Ambulatory Visit (INDEPENDENT_AMBULATORY_CARE_PROVIDER_SITE_OTHER): Payer: PPO | Admitting: Family Medicine

## 2021-04-02 ENCOUNTER — Other Ambulatory Visit: Payer: Self-pay

## 2021-04-02 VITALS — BP 132/80 | HR 66 | Temp 98.8°F | Resp 18 | Ht 59.0 in | Wt 154.8 lb

## 2021-04-02 DIAGNOSIS — I10 Essential (primary) hypertension: Secondary | ICD-10-CM | POA: Diagnosis not present

## 2021-04-02 DIAGNOSIS — F411 Generalized anxiety disorder: Secondary | ICD-10-CM

## 2021-04-02 DIAGNOSIS — E782 Mixed hyperlipidemia: Secondary | ICD-10-CM | POA: Diagnosis not present

## 2021-04-02 DIAGNOSIS — Z0000 Encounter for general adult medical examination without abnormal findings: Secondary | ICD-10-CM | POA: Diagnosis not present

## 2021-04-02 DIAGNOSIS — M1990 Unspecified osteoarthritis, unspecified site: Secondary | ICD-10-CM | POA: Diagnosis not present

## 2021-04-02 LAB — CBC WITH DIFFERENTIAL/PLATELET
Basophils Absolute: 0.1 10*3/uL (ref 0.0–0.1)
Basophils Relative: 1.1 % (ref 0.0–3.0)
Eosinophils Absolute: 0.2 10*3/uL (ref 0.0–0.7)
Eosinophils Relative: 2.4 % (ref 0.0–5.0)
HCT: 43.1 % (ref 36.0–46.0)
Hemoglobin: 14.8 g/dL (ref 12.0–15.0)
Lymphocytes Relative: 24.4 % (ref 12.0–46.0)
Lymphs Abs: 1.9 10*3/uL (ref 0.7–4.0)
MCHC: 34.3 g/dL (ref 30.0–36.0)
MCV: 89.8 fl (ref 78.0–100.0)
Monocytes Absolute: 0.5 10*3/uL (ref 0.1–1.0)
Monocytes Relative: 6.4 % (ref 3.0–12.0)
Neutro Abs: 5.1 10*3/uL (ref 1.4–7.7)
Neutrophils Relative %: 65.7 % (ref 43.0–77.0)
Platelets: 250 10*3/uL (ref 150.0–400.0)
RBC: 4.8 Mil/uL (ref 3.87–5.11)
RDW: 13.5 % (ref 11.5–15.5)
WBC: 7.8 10*3/uL (ref 4.0–10.5)

## 2021-04-02 LAB — COMPREHENSIVE METABOLIC PANEL
ALT: 12 U/L (ref 0–35)
AST: 19 U/L (ref 0–37)
Albumin: 4.1 g/dL (ref 3.5–5.2)
Alkaline Phosphatase: 63 U/L (ref 39–117)
BUN: 24 mg/dL — ABNORMAL HIGH (ref 6–23)
CO2: 27 mEq/L (ref 19–32)
Calcium: 9.8 mg/dL (ref 8.4–10.5)
Chloride: 103 mEq/L (ref 96–112)
Creatinine, Ser: 0.72 mg/dL (ref 0.40–1.20)
GFR: 78.14 mL/min (ref >60.00)
Glucose, Bld: 81 mg/dL (ref 70–99)
Potassium: 4.6 mEq/L (ref 3.5–5.1)
Sodium: 140 mEq/L (ref 135–145)
Total Bilirubin: 0.9 mg/dL (ref 0.2–1.2)
Total Protein: 6.9 g/dL (ref 6.0–8.3)

## 2021-04-02 LAB — LIPID PANEL
Cholesterol: 158 mg/dL (ref 0–200)
HDL: 54.7 mg/dL (ref >39.00)
LDL Cholesterol: 76 mg/dL (ref 0–99)
NonHDL: 103.67
Total CHOL/HDL Ratio: 3
Triglycerides: 137 mg/dL (ref 0.0–149.0)
VLDL: 27.4 mg/dL (ref 0.0–40.0)

## 2021-04-02 MED ORDER — PRAVASTATIN SODIUM 40 MG PO TABS: 40.0000 mg | ORAL_TABLET | Freq: Every day | ORAL | 1 refills | Status: DC

## 2021-04-02 MED ORDER — TRIAMTERENE-HCTZ 37.5-25 MG PO CAPS: 1.0000 | ORAL_CAPSULE | Freq: Every day | ORAL | 1 refills | Status: DC

## 2021-04-02 MED ORDER — DILTIAZEM HCL ER COATED BEADS 300 MG PO CP24: 300.0000 mg | ORAL_CAPSULE | Freq: Every day | ORAL | 1 refills | Status: DC

## 2021-04-02 MED ORDER — CITALOPRAM HYDROBROMIDE 10 MG PO TABS: 10.0000 mg | ORAL_TABLET | Freq: Every day | ORAL | 3 refills | Status: DC

## 2021-04-02 MED ORDER — MELOXICAM 15 MG PO TABS: 15.0000 mg | ORAL_TABLET | Freq: Every day | ORAL | 1 refills | Status: DC

## 2021-04-02 MED ORDER — LISINOPRIL 30 MG PO TABS: 30.0000 mg | ORAL_TABLET | Freq: Every day | ORAL | 1 refills | Status: DC

## 2021-04-02 NOTE — Assessment & Plan Note (Signed)
Well controlled, no changes to meds. Encouraged heart healthy diet such as the DASH diet and exercise as tolerated.  °

## 2021-04-02 NOTE — Assessment & Plan Note (Signed)
ghm utd Check labs  

## 2021-04-02 NOTE — Progress Notes (Signed)
Patient ID: Kimberly Edwards, female    DOB: 06/30/1941  Age: 80 y.o. MRN: [id number]    Subjective:  Subjective  HPI Kimberly Edwards presents for a comprehensive physical examination today. She complains of difficulty walking, however she is going to see her Ortho. Pt has a chronic pain in her hips and back. Pt is seeing Rosana Hoes for chronic care management. She endorses taking 15 mg of Mobic PO Daily for her dx of arthritis. She denies any chest pain, SOB, fever, abdominal pain, cough, chills, sore throat, dysuria, urinary incontinence, back pain, HA, or N/V/D at this time. She notes that she is now a great grandmother.   Review of Systems  Constitutional:  Negative for chills, fatigue and fever.  HENT:  Negative for ear pain, rhinorrhea, sinus pressure, sinus pain, sore throat and tinnitus.   Eyes:  Negative for pain.  Respiratory:  Negative for cough, shortness of breath and wheezing.   Cardiovascular:  Negative for chest pain.  Gastrointestinal:  Negative for abdominal pain, anal bleeding, constipation, diarrhea, nausea and vomiting.  Genitourinary:  Negative for flank pain.  Musculoskeletal:  Negative for back pain and neck pain.       (+) difficulty walking    Skin:  Negative for rash.  Neurological:  Negative for seizures, weakness, light-headedness, numbness and headaches.   History Past Medical History:  Diagnosis Date   Arthritis    Depression    Heart murmur    Hyperlipidemia    Hypertension    Knee joint replacement status, left 2003   PONV (postoperative nausea and vomiting)    Right knee DJD     She has a past surgical history that includes Cholecystectomy (1998); Replacement total knee; Breast biopsy (2000); Tonsillectomy; Total knee arthroplasty (03/23/2012); and Joint replacement.   Her family history includes Breast cancer in her sister; Hypertension in her father.She reports that she quit smoking about 9 years ago. Her smoking use included cigarettes. She has a 5.00  pack-year smoking history. She has never used smokeless tobacco. She reports current alcohol use. She reports that she does not use drugs.  Current Outpatient Medications on File Prior to Visit  Medication Sig Dispense Refill   aspirin 81 MG tablet Take 81 mg by mouth daily.     fish oil-omega-3 fatty acids 1000 MG capsule Take 2 g by mouth daily.      GLUCOSAMINE-CHONDROITIN PO Take 1 tablet by mouth daily. 1500mg  of glucosamine     Multiple Vitamins-Minerals (MULTIVITAMIN WITH MINERALS) tablet Take 2 tablets by mouth daily. Vitafusion gummie     NEOMYCIN-POLYMYXIN-HYDROCORTISONE (CORTISPORIN) 1 % SOLN OTIC solution Apply 1-2 drops to toe BID after soaking 10 mL 1   Polyethyl Glycol-Propyl Glycol 0.4-0.3 % SOLN Apply 1 drop to eye daily.      No current facility-administered medications on file prior to visit.     Objective:  Objective  Physical Exam Vitals and nursing note reviewed.  Constitutional:      General: She is not in acute distress.    Appearance: Normal appearance. She is well-developed. She is not ill-appearing.  HENT:     Head: Normocephalic and atraumatic.     Right Ear: External ear normal.     Left Ear: External ear normal.     Nose: Nose normal.  Eyes:     General:        Right eye: No discharge.        Left eye: No discharge.     Extraocular Movements:  Extraocular movements intact.     Pupils: Pupils are equal, round, and reactive to light.  Cardiovascular:     Rate and Rhythm: Normal rate and regular rhythm.     Pulses: Normal pulses.     Heart sounds: Normal heart sounds. No murmur heard.   No friction rub. No gallop.  Pulmonary:     Effort: Pulmonary effort is normal. No respiratory distress.     Breath sounds: Normal breath sounds. No stridor. No wheezing, rhonchi or rales.  Chest:     Chest wall: No tenderness.  Abdominal:     General: Bowel sounds are normal. There is no distension.     Palpations: Abdomen is soft. There is no mass.      Tenderness: There is no abdominal tenderness. There is no guarding or rebound.     Hernia: No hernia is present.  Musculoskeletal:        General: Normal range of motion.     Cervical back: Normal range of motion and neck supple.     Right lower leg: No edema.     Left lower leg: No edema.  Skin:    General: Skin is warm and dry.  Neurological:     Mental Status: She is alert and oriented to person, place, and time.  Psychiatric:        Behavior: Behavior normal.        Thought Content: Thought content normal.   BP 132/80 (BP Location: Right Arm, Patient Position: Sitting, Cuff Size: Normal)   Pulse 66   Temp 98.8 F (37.1 C) (Oral)   Resp 18   Ht 4\' 11"  (1.499 m)   Wt 154 lb 12.8 oz (70.2 kg)   SpO2 97%   BMI 31.27 kg/m  Wt Readings from Last 3 Encounters:  04/02/21 154 lb 12.8 oz (70.2 kg)  06/23/20 153 lb (69.4 kg)  12/21/19 148 lb (67.1 kg)     Lab Results  Component Value Date   WBC 9.8 12/22/2018   HGB 15.5 (H) 12/22/2018   HCT 45.6 12/22/2018   PLT 293.0 12/22/2018   GLUCOSE 88 06/23/2020   CHOL 173 06/23/2020   TRIG 135 06/23/2020   HDL 58 06/23/2020   LDLDIRECT 121.0 03/30/2015   LDLCALC 92 06/23/2020   ALT 11 06/23/2020   AST 18 06/23/2020   NA 140 06/23/2020   K 4.7 06/23/2020   CL 105 06/23/2020   CREATININE 0.83 06/23/2020   BUN 31 (H) 06/23/2020   CO2 28 06/23/2020   TSH 1.88 09/27/2014   INR 0.98 03/17/2012   HGBA1C 5.6 06/08/2012    DG Bone Density  Result Date: 09/25/2020 EXAM: DUAL X-RAY ABSORPTIOMETRY (DXA) FOR BONE MINERAL DENSITY IMPRESSION: Referring Physician:  Rosalita Chessman Edwards Your patient completed a BMD test using Lunar IDXA DXA system ( analysis version: 16 ) manufactured by [employer]. Technologist: AW PATIENT: Name: Kimberly, Edwards Patient ID: [id number] Birth Date: [birth year] Height: 57.0 in. Sex: Female Measured: 09/25/2020 Weight: 156.2 lbs. Indications: Advanced Age, Caucasian, Celexa, Estrogen Deficient,  Postmenopausal Fractures: None Treatments: None ASSESSMENT: The BMD measured at Femur Neck Right is 0.744 g/cm2 with a T-score of -2.1. This patient is considered osteopenic according to [facility]) criteria. The scan quality is good. Lumbar spine was not utilized due to advanced degenerative changes. Site Region Measured Date Measured Age YA BMD Significant CHANGE T-score DualFemur Neck Right 09/25/2020 81.3 -2.1 0.744 g/cm2 DualFemur Total Mean 09/25/2020 81.3 -1.4 0.827 g/cm2 Left Forearm  Radius 33% 09/25/2020 81.3 -1.7 0.740 g/cm2 World Health Organization [facility]) criteria for post-menopausal, Caucasian Women: Normal       T-score at or above -1 SD Osteopenia   T-score between -1 and -2.5 SD Osteoporosis T-score at or below -2.5 SD RECOMMENDATION: 1. All patients should optimize calcium and vitamin D intake. 2. Consider FDA approved medical therapies in postmenopausal women and men aged 32 years and older, based on the following: a. A hip or vertebral (clinical or morphometric) fracture b. T- score < or = -2.5 at the femoral neck or spine after appropriate evaluation to exclude secondary causes c. Low bone mass (T-score between -1.0 and -2.5 at the femoral neck or spine) and a 10 year probability of a hip fracture > or = 3% or a 10 year probability of a major osteoporosis-related fracture > or = 20% based on the [place]-adapted WHO algorithm d. Clinician judgment and/or patient preferences may indicate treatment for people with 10-year fracture probabilities above or below these levels FOLLOW-UP: Patients with diagnosis of osteoporosis or at high risk for fracture should have regular bone mineral density tests. For patients eligible for Medicare, routine testing is allowed once every 2 years. The testing frequency can be increased to one year for patients who have rapidly progressing disease, those who are receiving or discontinuing medical therapy to restore bone mass, or have additional risk  factors. I have reviewed this report and agree with the above findings. [facility] FRAX* 10-year Probability of Fracture Based on femoral neck BMD: DualFemur (Right) Major Osteoporotic Fracture: 15.5% Hip Fracture:                4.7% Population:                  [place] (Caucasian) Risk Factors:                None *FRAX is a Materials engineer of the [employer] of [employer] for Metabolic Bone Disease, a World Pharmacologist (WHO) [facility]. ASSESSMENT: The probability of a major osteoporotic fracture is 15.5 % within the next ten years. The probability of a hip fracture is 4.7 % within the next ten years. Electronically Signed   By: Lowella Grip III M.D.   On: 09/25/2020 14:10     Assessment & Plan:  Plan    Meds ordered this encounter  Medications   pravastatin (PRAVACHOL) 40 MG tablet    Sig: Take 1 tablet (40 mg total) by mouth daily.    Dispense:  90 tablet    Refill:  1   diltiazem (CARTIA XT) 300 MG 24 hr capsule    Sig: Take 1 capsule (300 mg total) by mouth daily.    Dispense:  90 capsule    Refill:  1   lisinopril (ZESTRIL) 30 MG tablet    Sig: Take 1 tablet (30 mg total) by mouth daily.    Dispense:  90 tablet    Refill:  1   meloxicam (MOBIC) 15 MG tablet    Sig: Take 1 tablet (15 mg total) by mouth daily.    Dispense:  90 tablet    Refill:  1   citalopram (CELEXA) 10 MG tablet    Sig: Take 1 tablet (10 mg total) by mouth daily.    Dispense:  90 tablet    Refill:  3   triamterene-hydrochlorothiazide (DYAZIDE) 37.5-25 MG capsule    Sig: Take 1 each (1 capsule total) by mouth daily.  Dispense:  90 capsule    Refill:  1    Problem List Items Addressed This Visit       Unprioritized   Arthritis   Relevant Medications   meloxicam (MOBIC) 15 MG tablet   Generalized anxiety disorder   Relevant Medications   citalopram (CELEXA) 10 MG tablet   Hyperlipidemia    Encourage heart healthy diet such as MIND or DASH diet,  increase exercise, avoid trans fats, simple carbohydrates and processed foods, consider a krill or fish or flaxseed oil cap daily.        Relevant Medications   pravastatin (PRAVACHOL) 40 MG tablet   diltiazem (CARTIA XT) 300 MG 24 hr capsule   lisinopril (ZESTRIL) 30 MG tablet   triamterene-hydrochlorothiazide (DYAZIDE) 37.5-25 MG capsule   Other Relevant Orders   Lipid panel   CBC with Differential/Platelet   Comprehensive metabolic panel   Hypertension    Well controlled, no changes to meds. Encouraged heart healthy diet such as the DASH diet and exercise as tolerated.        Relevant Medications   pravastatin (PRAVACHOL) 40 MG tablet   diltiazem (CARTIA XT) 300 MG 24 hr capsule   lisinopril (ZESTRIL) 30 MG tablet   triamterene-hydrochlorothiazide (DYAZIDE) 37.5-25 MG capsule   Preventative health care - Primary    ghm utd Check labs        Other Visit Diagnoses     Essential hypertension       Relevant Medications   pravastatin (PRAVACHOL) 40 MG tablet   diltiazem (CARTIA XT) 300 MG 24 hr capsule   lisinopril (ZESTRIL) 30 MG tablet   triamterene-hydrochlorothiazide (DYAZIDE) 37.5-25 MG capsule   Other Relevant Orders   Lipid panel   CBC with Differential/Platelet   Comprehensive metabolic panel      Dexa: Last completed on 09/25/2020, osteopenic noted, repeat every 2 years.   Mammo: Last completed on 08/09/2020, results were normal, repeat every 1 year.   Follow-up: Return in about 3 months (around 07/03/2021), or if symptoms worsen or fail to improve, for hypertension, hyperlipidemia.   I,Gordon Zheng,acting as a Education administrator for [employer], DO.,have documented all relevant documentation on the behalf of Ann Held, DO,as directed by  Ann Held, DO while in the presence of [place], DO, have reviewed all documentation for this visit. The documentation on 04/02/21 for the exam, diagnosis,  procedures, and orders are all accurate and complete.

## 2021-04-02 NOTE — Patient Instructions (Signed)
Preventive Care 82 Years and Older, Female Preventive care refers to lifestyle choices and visits with your health care provider that can promote health and wellness. This includes: A yearly physical exam. This is also called an annual wellness visit. Regular dental and eye exams. Immunizations. Screening for certain conditions. Healthy lifestyle choices, such as: Eating a healthy diet. Getting regular exercise. Not using drugs or products that contain nicotine and tobacco. Limiting alcohol use. What can I expect for my preventive care visit? Physical exam Your health care provider will check your: Height and weight. These may be used to calculate your BMI (body mass index). BMI is a measurement that tells if you are at a healthy weight. Heart rate and blood pressure. Body temperature. Skin for abnormal spots. Counseling Your health care provider may ask you questions about your: Past medical problems. Family's medical history. Alcohol, tobacco, and drug use. Emotional well-being. Home life and relationship well-being. Sexual activity. Diet, exercise, and sleep habits. History of falls. Memory and ability to understand (cognition). Work and work Statistician. Pregnancy and menstrual history. Access to firearms. What immunizations do I need?  Vaccines are usually given at various ages, according to a schedule. Your health care provider will recommend vaccines for you based on your age, medicalhistory, and lifestyle or other factors, such as travel or where you work. What tests do I need? Blood tests Lipid and cholesterol levels. These may be checked every 5 years, or more often depending on your overall health. Hepatitis C test. Hepatitis B test. Screening Lung cancer screening. You may have this screening every year starting at age 44 if you have a 30-pack-year history of smoking and currently smoke or have quit within the past 15 years. Colorectal cancer screening. All  adults should have this screening starting at age 39 and continuing until age 65. Your health care provider may recommend screening at age 61 if you are at increased risk. You will have tests every 1-10 years, depending on your results and the type of screening test. Diabetes screening. This is done by checking your blood sugar (glucose) after you have not eaten for a while (fasting). You may have this done every 1-3 years. Mammogram. This may be done every 1-2 years. Talk with your health care provider about how often you should have regular mammograms. Abdominal aortic aneurysm (AAA) screening. You may need this if you are a current or former smoker. BRCA-related cancer screening. This may be done if you have a family history of breast, ovarian, tubal, or peritoneal cancers. Other tests STD (sexually transmitted disease) testing, if you are at risk. Bone density scan. This is done to screen for osteoporosis. You may have this done starting at age 54. Talk with your health care provider about your test results, treatment options,and if necessary, the need for more tests. Follow these instructions at home: Eating and drinking  Eat a diet that includes fresh fruits and vegetables, whole grains, lean protein, and low-fat dairy products. Limit your intake of foods with high amounts of sugar, saturated fats, and salt. Take vitamin and mineral supplements as recommended by your health care provider. Do not drink alcohol if your health care provider tells you not to drink. If you drink alcohol: Limit how much you have to 0-1 drink a day. Be aware of how much alcohol is in your drink. In the [place] one drink equals one 12 oz bottle of beer (355 mL), one 5 oz glass of wine (148 mL), or one 1  oz glass of hard liquor (44 mL).  Lifestyle Take daily care of your teeth and gums. Brush your teeth every morning and night with fluoride toothpaste. Floss one time each day. Stay active. Exercise for at  least 30 minutes 5 or more days each week. Do not use any products that contain nicotine or tobacco, such as cigarettes, e-cigarettes, and chewing tobacco. If you need help quitting, ask your health care provider. Do not use drugs. If you are sexually active, practice safe sex. Use a condom or other form of protection in order to prevent STIs (sexually transmitted infections). Talk with your health care provider about taking a low-dose aspirin or statin. Find healthy ways to cope with stress, such as: Meditation, yoga, or listening to music. Journaling. Talking to a trusted person. Spending time with friends and family. Safety Always wear your seat belt while driving or riding in a vehicle. Do not drive: If you have been drinking alcohol. Do not ride with someone who has been drinking. When you are tired or distracted. While texting. Wear a helmet and other protective equipment during sports activities. If you have firearms in your house, make sure you follow all gun safety procedures. What's next? Visit your health care provider once a year for an annual wellness visit. Ask your health care provider how often you should have your eyes and teeth checked. Stay up to date on all vaccines. This information is not intended to replace advice given to you by your health care provider. Make sure you discuss any questions you have with your healthcare provider. Document Revised: 09/20/2020 Document Reviewed: 09/24/2018 Elsevier Patient Education  2022 [employer].

## 2021-04-02 NOTE — Assessment & Plan Note (Signed)
Encourage heart healthy diet such as MIND or DASH diet, increase exercise, avoid trans fats, simple carbohydrates and processed foods, consider a krill or fish or flaxseed oil cap daily.  °

## 2021-04-05 DIAGNOSIS — M25551 Pain in right hip: Secondary | ICD-10-CM | POA: Diagnosis not present

## 2021-04-05 DIAGNOSIS — M25552 Pain in left hip: Secondary | ICD-10-CM | POA: Diagnosis not present

## 2021-04-05 DIAGNOSIS — M419 Scoliosis, unspecified: Secondary | ICD-10-CM | POA: Diagnosis not present

## 2021-04-09 ENCOUNTER — Telehealth: Payer: Self-pay | Admitting: Pharmacist

## 2021-04-09 NOTE — Chronic Care Management (AMB) (Signed)
    Chronic Care Management Pharmacy Assistant   Name: Jode Lippe  MRN: [id number] DOB: [birth year]   Reason for Encounter: Disease State-General    Recent office visits:  04/02/21 Roma Schanz PCP- Seen for annual visit. Return in 2 months.  11/21/20 Roma Schanz PCP- General Follow up. Recent consult visits:    Hospital visits:  None in previous 6 months  Medications: Outpatient Encounter Medications as of 04/09/2021  Medication Sig   aspirin 81 MG tablet Take 81 mg by mouth daily.   citalopram (CELEXA) 10 MG tablet Take 1 tablet (10 mg total) by mouth daily.   diltiazem (CARTIA XT) 300 MG 24 hr capsule Take 1 capsule (300 mg total) by mouth daily.   fish oil-omega-3 fatty acids 1000 MG capsule Take 2 g by mouth daily.    GLUCOSAMINE-CHONDROITIN PO Take 1 tablet by mouth daily. 1500mg  of glucosamine   lisinopril (ZESTRIL) 30 MG tablet Take 1 tablet (30 mg total) by mouth daily.   meloxicam (MOBIC) 15 MG tablet Take 1 tablet (15 mg total) by mouth daily.   Multiple Vitamins-Minerals (MULTIVITAMIN WITH MINERALS) tablet Take 2 tablets by mouth daily. Vitafusion gummie   NEOMYCIN-POLYMYXIN-HYDROCORTISONE (CORTISPORIN) 1 % SOLN OTIC solution Apply 1-2 drops to toe BID after soaking   Polyethyl Glycol-Propyl Glycol 0.4-0.3 % SOLN Apply 1 drop to eye daily.    pravastatin (PRAVACHOL) 40 MG tablet Take 1 tablet (40 mg total) by mouth daily.   triamterene-hydrochlorothiazide (DYAZIDE) 37.5-25 MG capsule Take 1 each (1 capsule total) by mouth daily.   No facility-administered encounter medications on file as of 04/09/2021.   Have you had any problems recently with your health? Patient states she just saw her pcp and everything looked good, she is not having any problems at this time.  Have you had any problems with your pharmacy? Patient states she is not having any problems with her pharmacy. She states [facility] great.  What issues or side effects are you  having with your medications? Patient states she has not had any side effects or issues with her medication.  What would you like me to pass along to [place] for them to help you with?  Patient states she has had back problems but she is scheduled to see physical therapy in July.  What can we do to take care of you better? Patient states there is nothing at this time.   Star Rating Drugs: Pravastatin 40 mg last filled 02/01/21 90 DS Lisinopril 30 mg last filled 02/01/21 90 DS  [facility] Clinical Pharmacist Assistant [phone]

## 2021-04-18 DIAGNOSIS — M5416 Radiculopathy, lumbar region: Secondary | ICD-10-CM | POA: Diagnosis not present

## 2021-04-18 DIAGNOSIS — M6281 Muscle weakness (generalized): Secondary | ICD-10-CM | POA: Diagnosis not present

## 2021-04-18 DIAGNOSIS — M48061 Spinal stenosis, lumbar region without neurogenic claudication: Secondary | ICD-10-CM | POA: Diagnosis not present

## 2021-04-18 DIAGNOSIS — M4125 Other idiopathic scoliosis, thoracolumbar region: Secondary | ICD-10-CM | POA: Diagnosis not present

## 2021-04-30 DIAGNOSIS — M48061 Spinal stenosis, lumbar region without neurogenic claudication: Secondary | ICD-10-CM | POA: Diagnosis not present

## 2021-04-30 DIAGNOSIS — M5416 Radiculopathy, lumbar region: Secondary | ICD-10-CM | POA: Diagnosis not present

## 2021-04-30 DIAGNOSIS — M4125 Other idiopathic scoliosis, thoracolumbar region: Secondary | ICD-10-CM | POA: Diagnosis not present

## 2021-04-30 DIAGNOSIS — M6281 Muscle weakness (generalized): Secondary | ICD-10-CM | POA: Diagnosis not present

## 2021-05-08 DIAGNOSIS — M6281 Muscle weakness (generalized): Secondary | ICD-10-CM | POA: Diagnosis not present

## 2021-05-08 DIAGNOSIS — M5416 Radiculopathy, lumbar region: Secondary | ICD-10-CM | POA: Diagnosis not present

## 2021-05-08 DIAGNOSIS — M4125 Other idiopathic scoliosis, thoracolumbar region: Secondary | ICD-10-CM | POA: Diagnosis not present

## 2021-05-08 DIAGNOSIS — M48061 Spinal stenosis, lumbar region without neurogenic claudication: Secondary | ICD-10-CM | POA: Diagnosis not present

## 2021-05-10 DIAGNOSIS — M5416 Radiculopathy, lumbar region: Secondary | ICD-10-CM | POA: Diagnosis not present

## 2021-05-10 DIAGNOSIS — M48061 Spinal stenosis, lumbar region without neurogenic claudication: Secondary | ICD-10-CM | POA: Diagnosis not present

## 2021-05-10 DIAGNOSIS — M4125 Other idiopathic scoliosis, thoracolumbar region: Secondary | ICD-10-CM | POA: Diagnosis not present

## 2021-05-10 DIAGNOSIS — M6281 Muscle weakness (generalized): Secondary | ICD-10-CM | POA: Diagnosis not present

## 2021-05-15 DIAGNOSIS — M7062 Trochanteric bursitis, left hip: Secondary | ICD-10-CM | POA: Diagnosis not present

## 2021-05-15 DIAGNOSIS — M5416 Radiculopathy, lumbar region: Secondary | ICD-10-CM | POA: Diagnosis not present

## 2021-05-15 DIAGNOSIS — M48061 Spinal stenosis, lumbar region without neurogenic claudication: Secondary | ICD-10-CM | POA: Diagnosis not present

## 2021-05-21 DIAGNOSIS — D225 Melanocytic nevi of trunk: Secondary | ICD-10-CM | POA: Diagnosis not present

## 2021-05-21 DIAGNOSIS — L304 Erythema intertrigo: Secondary | ICD-10-CM | POA: Diagnosis not present

## 2021-05-21 DIAGNOSIS — D2222 Melanocytic nevi of left ear and external auricular canal: Secondary | ICD-10-CM | POA: Diagnosis not present

## 2021-05-21 DIAGNOSIS — Z86018 Personal history of other benign neoplasm: Secondary | ICD-10-CM | POA: Diagnosis not present

## 2021-05-21 DIAGNOSIS — L578 Other skin changes due to chronic exposure to nonionizing radiation: Secondary | ICD-10-CM | POA: Diagnosis not present

## 2021-05-21 DIAGNOSIS — D2271 Melanocytic nevi of right lower limb, including hip: Secondary | ICD-10-CM | POA: Diagnosis not present

## 2021-05-21 DIAGNOSIS — M7062 Trochanteric bursitis, left hip: Secondary | ICD-10-CM | POA: Diagnosis not present

## 2021-05-21 DIAGNOSIS — L821 Other seborrheic keratosis: Secondary | ICD-10-CM | POA: Diagnosis not present

## 2021-05-21 DIAGNOSIS — Z85828 Personal history of other malignant neoplasm of skin: Secondary | ICD-10-CM | POA: Diagnosis not present

## 2021-05-23 ENCOUNTER — Ambulatory Visit (INDEPENDENT_AMBULATORY_CARE_PROVIDER_SITE_OTHER): Payer: PPO | Admitting: Pharmacist

## 2021-05-23 DIAGNOSIS — M1990 Unspecified osteoarthritis, unspecified site: Secondary | ICD-10-CM

## 2021-05-23 DIAGNOSIS — I10 Essential (primary) hypertension: Secondary | ICD-10-CM | POA: Diagnosis not present

## 2021-05-23 DIAGNOSIS — M8580 Other specified disorders of bone density and structure, unspecified site: Secondary | ICD-10-CM

## 2021-05-23 DIAGNOSIS — E782 Mixed hyperlipidemia: Secondary | ICD-10-CM | POA: Diagnosis not present

## 2021-05-23 DIAGNOSIS — F411 Generalized anxiety disorder: Secondary | ICD-10-CM

## 2021-05-23 NOTE — Chronic Care Management (AMB) (Signed)
Chronic Care Management Pharmacy Note  05/23/2021 Name:  Kimberly Edwards MRN:  [id number] DOB:  [birth year]  Subjective: Kimberly Edwards is an 80 y.o. year old female who is a primary patient of Kimberly Held, DO.  The CCM team was consulted for assistance with disease management and care coordination needs.    Engaged with patient by telephone for follow up visit in response to provider referral for pharmacy case management and/or care coordination services.   Consent to Services:  The patient was given information about Chronic Care Management services, agreed to services, and gave verbal consent prior to initiation of services.  Please see initial visit note for detailed documentation.   Patient Care Team: Carollee Herter, Alferd Apa, DO as PCP - General (Family Medicine) Jari Pigg, MD as Consulting Physician (Dermatology) Elsie Saas, MD as Consulting Physician (Orthopedic Surgery) Calvert Cantor, MD as Consulting Physician (Ophthalmology) Garrel Ridgel, [place] as Consulting Physician (Podiatry) Cherre Robins, PharmD (Pharmacist)  Recent office visits: 04/02/21 Kimberly Edwards PCP- Seen for annual visit. No med chnages. Return in 2 months.  Recent consult visits: 05/21/2021 - Ortho - received steroid injection for bursitis in hip. 04/05/2021 - Ortho ( Dr Para March)  Hospital visits: None in previous 6 months  Objective:  Lab Results  Component Value Date   CREATININE 0.72 04/02/2021   CREATININE 0.83 06/23/2020   CREATININE 0.78 12/21/2019    Lab Results  Component Value Date   HGBA1C 5.6 06/08/2012   Last diabetic Eye exam: No results found for: HMDIABEYEEXA  Last diabetic Foot exam: No results found for: HMDIABFOOTEX      Component Value Date/Time   CHOL 158 04/02/2021 1038   TRIG 137.0 04/02/2021 1038   HDL 54.70 04/02/2021 1038   CHOLHDL 3 04/02/2021 1038   VLDL 27.4 04/02/2021 1038   LDLCALC 76 04/02/2021 1038   LDLCALC 92 06/23/2020 0926    LDLDIRECT 121.0 03/30/2015 1014    Hepatic Function Latest Ref Rng & Units 04/02/2021 06/23/2020 12/21/2019  Total Protein 6.0 - 8.3 g/dL 6.9 7.0 6.8  Albumin 3.5 - 5.2 g/dL 4.1 - 3.9  AST 0 - 37 U/L 19 18 17   ALT 0 - 35 U/L 12 11 12   Alk Phosphatase 39 - 117 U/L 63 - 63  Total Bilirubin 0.2 - 1.2 mg/dL 0.9 0.6 0.6  Bilirubin, Direct 0.0 - 0.3 mg/dL - - -    Lab Results  Component Value Date/Time   TSH 1.88 09/27/2014 09:51 AM   FREET4 0.93 09/27/2014 09:51 AM    CBC Latest Ref Rng & Units 04/02/2021 12/22/2018 06/23/2018  WBC 4.0 - 10.5 K/uL 7.8 9.8 7.8  Hemoglobin 12.0 - 15.0 g/dL 14.8 15.5(H) 15.4(H)  Hematocrit 36.0 - 46.0 % 43.1 45.6 45.2  Platelets 150.0 - 400.0 K/uL 250.0 293.0 251.0    No results found for: VD25OH  Clinical ASCVD: No  The ASCVD Risk score Mikey Bussing DC Jr., et al., 2013) failed to calculate for the following reasons:   The 2013 ASCVD risk score is only valid for ages 1 to 33     Social History   Tobacco Use  Smoking Status Former   Packs/day: 0.10   Years: 50.00   Pack years: 5.00   Types: Cigarettes   Quit date: 03/23/2012   Years since quitting: 9.1  Smokeless Tobacco Never  Tobacco Comments   social and weekends alcohol   BP Readings from Last 3 Encounters:  04/02/21 132/80  06/23/20 124/70  12/21/19  124/88   Pulse Readings from Last 3 Encounters:  04/02/21 66  06/23/20 67  12/21/19 68   Wt Readings from Last 3 Encounters:  04/02/21 154 lb 12.8 oz (70.2 kg)  06/23/20 153 lb (69.4 kg)  12/21/19 148 lb (67.1 kg)    Assessment: Review of patient past medical history, allergies, medications, health status, including review of consultants reports, laboratory and other test data, was performed as part of comprehensive evaluation and provision of chronic care management services.   SDOH:  (Social Determinants of Health) assessments and interventions performed:  SDOH Interventions    Flowsheet Row Most Recent Value  SDOH Interventions    Financial Strain Interventions Intervention Not Indicated  Physical Activity Interventions Intervention Not Indicated  [walks in pool 45 minutes 3 times per week,  plans to increase back to 60 minutes once ortho approves.]       CCM Care Plan  Allergies  Allergen Reactions   Oxycodone Nausea And Vomiting    Medications Reviewed Today     Reviewed by Cherre Robins, PharmD (Pharmacist) on 05/23/21 at [facility] List Status: <None>   Medication Order Taking? Sig Documenting Provider Last Dose Status Informant  aspirin 81 MG tablet [id number] Yes Take 81 mg by mouth daily. [provider] Taking Active Self  Calcium Carb-Cholecalciferol (CALCIUM 500 + D PO) [id number] Yes Take 1 tablet by mouth in the morning and at bedtime. [provider] Taking Active   citalopram (CELEXA) 10 MG tablet [id number] Yes Take 1 tablet (10 mg total) by mouth daily. Kimberly Held, DO Taking Active   diltiazem (CARTIA XT) 300 MG 24 hr capsule [id number] Yes Take 1 capsule (300 mg total) by mouth daily. Kimberly Edwards R, DO Taking Active   fish oil-omega-3 fatty acids 1000 MG capsule [id number] Yes Take 2 g by mouth daily.  [provider] Taking Active   GLUCOSAMINE-CHONDROITIN PO [id number] Yes Take 1 tablet by mouth daily. 1571m of glucosamine [provider] Taking Active   lisinopril (ZESTRIL) 30 MG tablet [id number] Take 1 tablet (30 mg total) by mouth daily. LAnn Held DO Taking Active   meloxicam (MOBIC) 15 MG tablet [id number] Take 1 tablet (15 mg total) by mouth daily. LRoma SchanzR, DO Taking Active   Multiple Vitamins-Minerals (MULTIVITAMIN WITH MINERALS) tablet [id number] Take 2 tablets by mouth daily. Vitafusion gummie [provider] Taking Active Self  Polyethyl Glycol-Propyl Glycol 0.4-0.3 % SOLN [id number] Apply 1 drop to eye daily.  [provider] Taking Active   pravastatin (PRAVACHOL) 40 MG tablet  [id number] Take 1 tablet (40 mg total) by mouth daily. LAnn Held DO Taking Active   triamterene-hydrochlorothiazide (DYAZIDE) 37.5-25 MG capsule [id number] Take 1 each (1 capsule total) by mouth daily. LAnn Held DO Taking Active             Patient Active Problem List   Diagnosis Date Noted   Preventative health care 04/02/2021   Bilateral hearing loss 06/23/2020   Estrogen deficiency 06/23/2020   Fatigue 12/22/2018   Generalized anxiety disorder 12/22/2018   Obesity (BMI 30-39.9) 07/01/2013   Dysuria 12/10/2012   Nausea and vomiting 03/25/2012   UTI (lower urinary tract infection) E Coli 03/25/2012   Hypertension    Arthritis    Knee joint replacement status, left    Right knee DJD    HTN (hypertension) 12/09/2011   Hyperlipidemia 12/09/2011   Osteoarthritis 05/31/2011  Immunization History  Administered Date(s) Administered   Fluad Quad(high Dose 65+) 06/22/2019   Influenza, High Dose Seasonal PF 06/22/2014, 06/27/2015, 06/18/2016, 06/17/2017, 06/23/2018   Influenza,inj,Quad PF,6+ Mos 07/01/2013   Influenza-Unspecified 06/14/2014   PFIZER Comirnaty(Gray Top)Covid-19 Tri-Sucrose Vaccine 01/25/2021   PFIZER(Purple Top)SARS-COV-2 Vaccination 11/06/2019, 11/27/2019, 06/23/2020   Pneumococcal Conjugate-13 09/29/2014   Pneumococcal Polysaccharide-23 10/19/2015    Conditions to be addressed/monitored: HTN, HLD, and hip pain / bursitis; GAD; osteopenia  Care Plan : General Pharmacy (Adult)  Updates made by Cherre Robins, PHARMD since 05/23/2021 12:00 AM     Problem: HTN, Osteopenia, HLD, GAD   Priority: High  Onset Date: 11/21/2020     Goal: Patient-Specific Goal   Start Date: 11/21/2020  Expected End Date: 05/21/2021  Recent Progress: On track  Priority: High  Note:   Current Barriers:  No therapy for bone health / osteopenia  Pharmacist Clinical Goal(s):  Over the next 180 days, patient will adhere to prescribed medication regimen  as evidenced by starting Calcium + Vitamin D OTC to prevent progression to osteoporosis. through collaboration with PharmD and provider. (Completed) Continue to monitor BP regularly at home.  Interventions: 1:1 collaboration with Carollee Herter, Alferd Apa, DO regarding development and update of comprehensive plan of care as evidenced by provider attestation and co-signature Inter-disciplinary care team collaboration (see longitudinal plan of care) Comprehensive medication review performed; medication list updated in electronic medical record  Hypertension (BP goal <140/90) -controlled -Current treatment: Diltiazem 342m daily Lisinopril 357mdaily Triamterene-hctz 37.5-25mg daily -Medications previously tried: none noted -Current home readings:   SBP: 132-137  DBP: 70-75 -Current exercise habits: Walks in pool 3 days per week for 45 minutes. She will increase time when ortho approves (had to cut back at request of physical therapies due to hip pain)  -Denies hypotensive/hypertensive symptoms  Interventions:  Reviewed BP goals Educated on Daily salt intake goal < 2300 mg; Discussed exercise goal of 150 minutes per week; patient to increase as able Counseled to monitor BP at home once per week, document, and provide log at future appointments Recommended to continue current medication  Hyperlipidemia: (LDL goal < 100) -controlled - last LDL was 76 (04/02/2021) -Current treatment: Pravastatin 4063maily -Medications previously tried: none noted -Current exercise habits: see above  Interventions:  Reviewed LDL goal Recommended to continue current medication  Depression/Anxiety (Goal: Minimize symptoms) -controlled -Current treatment: Citalopram 50m70medications previously tried/failed: none noted  Interventions:  None today, addressed at previous visits Recommended to continue current medication   Osteopenia (Goal to maintain bone density, prevent  fracture) -controlled -Last DEXA Scan: 09/25/20  T-Score femoral neck: -2.1  T-Score forearm radius: -1.7  10-year probability of major osteoporotic fracture: 15.5%  10-year probability of hip fracture: 4.7% -Patient is a candidate for pharmacologic treatment due to T-Score -1.0 to -2.5 and 10-year risk of hip fracture > 3% - Patient declined pharmacotherapy for osteopenia 11/2020 -Current treatment  Calcium 500mg67m - taking 2 tablets daily -Medications previously tried: none   Interventions:  Recommend separate calcium + D to take 1 tablet twice a day for maximal absorption Recommend [phone] units of vitamin D daily.  Recommend 1200 mg of calcium daily from dietary and supplemental sources. Recommend repeat bone density around December 2023   Patient Goals/Self-Care Activities Over the next 180 days, patient will:  Take medications as prescribed check blood pressure weekly, document, and provide at future appointments Change calcium + D to take 1 tablet twice a day  Follow Up  Plan: The care management team will reach out to the patient again over the next 180 days.        Medication Assistance: None required.  Patient affirms current coverage meets needs.  Patient's preferred pharmacy is:  [facility] - [place], [place] - [address] [place] [address] Phone: [phone] Fax: [phone]   Follow Up:  Patient agrees to Care Plan and Follow-up.  Plan: Telephone follow up appointment with care management team member scheduled for:  6 months  Cherre Robins, PharmD Clinical Pharmacist [facility]

## 2021-05-23 NOTE — Patient Instructions (Signed)
Visit Information  PATIENT GOALS:  Goals Addressed             This Visit's Progress    Chronic Care Management Pharmacy Care Plan   On track    [place] (see longitudinal plan of care for additional care plan information)  Current Barriers:  Chronic Disease Management support, education, and care coordination needs related to HTN, HLD, GAD, Osteoarthritis   Hypertension BP Readings from Last 3 Encounters:  04/02/21 132/80  06/23/20 124/70  12/21/19 124/88  Pharmacist Clinical Goal(s): Over the next 180 days, patient will work with PharmD and providers to maintain BP goal <140/90 Current regimen:  Diltiazem '300mg'$  daily Lisinopril '30mg'$  daily Triamterene-hctz 37.5-'25mg'$  daily Patient self care activities - Over the next 180 days, patient will: Check blood pressure once weekly, document, and provide at future appointments Ensure daily salt intake < 2300 mg/day Weekly exercise goal is 150 minutes per week; Continue to walk in pool and increase as able / as recommended by Dr Para March  Hyperlipidemia Lab Results  Component Value Date/Time   LDLCALC 76 04/02/2021 10:38 AM   LDLCALC 92 06/23/2020 09:26 AM   LDLDIRECT 121.0 03/30/2015 10:14 AM  Pharmacist Clinical Goal(s): Over the next 180 days, patient will work with PharmD and providers to maintain LDL goal < 100 Current regimen:  Pravastatin '40mg'$  daily Patient self care activities - Over the next 180 days, patient will: Maintain cholesterol medication regimen.   Osteopenia/Osteoporosis Screening Pharmacist Clinical Goal(s) Over the next 90 days, patient will work with PharmD and providers to maintain bone strength Current regimen:  Calcium '500mg'$  + D - taking 2 tablets daily Interventions: Recommend separate calcium + D to take 1 tablet twice a day for maximal absorption Recommend [phone] units of vitamin D daily.  Recommend 1200 mg of calcium daily from dietary and supplemental sources. Recommend repeat bone  density around December 2023 Patient self care activities - Over the next 90 days, patient will: Change calcium + D supplement to take 1 tablet TWICE a day   Health Maintenance  Pharmacist Clinical Goal(s) Over the next 90 days, patient will work with PharmD and providers to complete health maintenance screenings/vaccinations Interventions: Consider completing annual flu vaccine Patient self care activities - Over the next 90 days, patient will: Consider getting annual flu vaccine at appt with Dr Carollee Herter in September  Medication management Pharmacist Clinical Goal(s): Over the next 180 days, patient will work with PharmD and providers to maintain optimal medication adherence Current pharmacy: [facility] Interventions Comprehensive medication review performed. Utilize pharmacy services for medication synchronization, packaging and delivery Patient self care activities - Over the next 180 days, patient will: Focus on medication adherence by filling and taking medications appropriately  Take medications as prescribed Report any questions or concerns to PharmD and/or provider(s)  Patient Goals/Self-Care Activities Over the next 180 days, patient will:  Take medications as prescribed check blood pressure weekly, document, and provide at future appointments Change calcium + D to take 1 tablet twice a day  Please see past updates related to this goal by clicking on the "Past Updates" button in the selected goal          Patient verbalizes understanding of instructions provided today and agrees to view in [place].   Telephone follow up appointment with care management team member scheduled for: 6 months  Cherre Robins, PharmD Clinical Pharmacist [facility] [place] [facility]

## 2021-05-28 ENCOUNTER — Other Ambulatory Visit: Payer: Self-pay

## 2021-05-28 ENCOUNTER — Encounter (HOSPITAL_BASED_OUTPATIENT_CLINIC_OR_DEPARTMENT_OTHER): Payer: Self-pay

## 2021-05-28 ENCOUNTER — Emergency Department (HOSPITAL_BASED_OUTPATIENT_CLINIC_OR_DEPARTMENT_OTHER)
Admission: EM | Admit: 2021-05-28 | Discharge: 2021-05-28 | Disposition: A | Discharge status: Home / Self Care | Payer: PPO | Attending: Emergency Medicine | Admitting: Emergency Medicine

## 2021-05-28 ENCOUNTER — Emergency Department (HOSPITAL_BASED_OUTPATIENT_CLINIC_OR_DEPARTMENT_OTHER): Payer: PPO

## 2021-05-28 DIAGNOSIS — K5900 Constipation, unspecified: Secondary | ICD-10-CM | POA: Insufficient documentation

## 2021-05-28 DIAGNOSIS — I700 Atherosclerosis of aorta: Secondary | ICD-10-CM | POA: Diagnosis not present

## 2021-05-28 DIAGNOSIS — Z96651 Presence of right artificial knee joint: Secondary | ICD-10-CM | POA: Insufficient documentation

## 2021-05-28 DIAGNOSIS — K449 Diaphragmatic hernia without obstruction or gangrene: Secondary | ICD-10-CM | POA: Diagnosis not present

## 2021-05-28 DIAGNOSIS — Z87891 Personal history of nicotine dependence: Secondary | ICD-10-CM | POA: Diagnosis not present

## 2021-05-28 DIAGNOSIS — Z79899 Other long term (current) drug therapy: Secondary | ICD-10-CM | POA: Insufficient documentation

## 2021-05-28 DIAGNOSIS — I10 Essential (primary) hypertension: Secondary | ICD-10-CM | POA: Insufficient documentation

## 2021-05-28 DIAGNOSIS — R1084 Generalized abdominal pain: Secondary | ICD-10-CM | POA: Diagnosis not present

## 2021-05-28 DIAGNOSIS — N281 Cyst of kidney, acquired: Secondary | ICD-10-CM | POA: Diagnosis not present

## 2021-05-28 DIAGNOSIS — Z7982 Long term (current) use of aspirin: Secondary | ICD-10-CM | POA: Insufficient documentation

## 2021-05-28 DIAGNOSIS — R1030 Lower abdominal pain, unspecified: Secondary | ICD-10-CM | POA: Diagnosis present

## 2021-05-28 DIAGNOSIS — K5730 Diverticulosis of large intestine without perforation or abscess without bleeding: Secondary | ICD-10-CM | POA: Diagnosis not present

## 2021-05-28 LAB — COMPREHENSIVE METABOLIC PANEL
ALT: 23 U/L (ref 0–44)
AST: 21 U/L (ref 15–41)
Albumin: 3.6 g/dL (ref 3.5–5.0)
Alkaline Phosphatase: 58 U/L (ref 38–126)
Anion gap: 9 (ref 5–15)
BUN: 43 mg/dL — ABNORMAL HIGH (ref 8–23)
CO2: 22 mmol/L (ref 22–32)
Calcium: 9 mg/dL (ref 8.9–10.3)
Chloride: 104 mmol/L (ref 98–111)
Creatinine, Ser: 1.13 mg/dL — ABNORMAL HIGH (ref 0.44–1.00)
GFR, Estimated: 49 mL/min — ABNORMAL LOW (ref >60)
Glucose, Bld: 122 mg/dL — ABNORMAL HIGH (ref 70–99)
Potassium: 3.7 mmol/L (ref 3.5–5.1)
Sodium: 135 mmol/L (ref 135–145)
Total Bilirubin: 0.6 mg/dL (ref 0.3–1.2)
Total Protein: 6.6 g/dL (ref 6.5–8.1)

## 2021-05-28 LAB — CBC WITH DIFFERENTIAL/PLATELET
Abs Immature Granulocytes: 0.21 10*3/uL — ABNORMAL HIGH (ref 0.00–0.07)
Basophils Absolute: 0 10*3/uL (ref 0.0–0.1)
Basophils Relative: 0 %
Eosinophils Absolute: 0.1 10*3/uL (ref 0.0–0.5)
Eosinophils Relative: 0 %
HCT: 44.7 % (ref 36.0–46.0)
Hemoglobin: 15.6 g/dL — ABNORMAL HIGH (ref 12.0–15.0)
Immature Granulocytes: 1 %
Lymphocytes Relative: 13 %
Lymphs Abs: 2.5 10*3/uL (ref 0.7–4.0)
MCH: 30.8 pg (ref 26.0–34.0)
MCHC: 34.9 g/dL (ref 30.0–36.0)
MCV: 88.2 fL (ref 80.0–100.0)
Monocytes Absolute: 1.2 10*3/uL — ABNORMAL HIGH (ref 0.1–1.0)
Monocytes Relative: 6 %
Neutro Abs: 14.8 10*3/uL — ABNORMAL HIGH (ref 1.7–7.7)
Neutrophils Relative %: 80 %
Platelets: 318 10*3/uL (ref 150–400)
RBC: 5.07 MIL/uL (ref 3.87–5.11)
RDW: 13.2 % (ref 11.5–15.5)
WBC: 18.8 10*3/uL — ABNORMAL HIGH (ref 4.0–10.5)
nRBC: 0 % (ref 0.0–0.2)

## 2021-05-28 LAB — URINALYSIS, ROUTINE W REFLEX MICROSCOPIC
Bilirubin Urine: NEGATIVE
Glucose, UA: NEGATIVE mg/dL
Hgb urine dipstick: NEGATIVE
Ketones, ur: NEGATIVE mg/dL
Leukocytes,Ua: NEGATIVE
Nitrite: NEGATIVE
Protein, ur: NEGATIVE mg/dL
Specific Gravity, Urine: 1.01 (ref 1.005–1.030)
pH: 6 (ref 5.0–8.0)

## 2021-05-28 LAB — LIPASE, BLOOD: Lipase: 30 U/L (ref 11–51)

## 2021-05-28 MED ORDER — IOHEXOL 300 MG/ML  SOLN
100.0000 mL | Freq: Once | INTRAMUSCULAR | Status: AC | PRN
  Administered 2021-05-28: 100 mL via INTRAVENOUS

## 2021-05-28 NOTE — ED Triage Notes (Signed)
Pt c/o mid/lower abd pin x 6 days-bloating/constipation-minimal stool with dulcolax yesterday-NAD-steady gait

## 2021-05-28 NOTE — Discharge Instructions (Signed)
Please read and follow all provided instructions.  Your diagnoses today include:  1. Generalized abdominal pain     Tests performed today include: Blood cell counts and platelets - high white blood cell count Kidney and liver function tests Pancreas function test (called lipase) Urine test to look for infection CT scan of the abdomen and pelvis - no severe problems, infections, or obstructions Vital signs. See below for your results today.   Medications prescribed:  None  Take any prescribed medications only as directed.  Home care instructions:  Follow any educational materials contained in this packet.  Follow-up instructions: Please follow-up with your primary care provider in the next 2 days for further evaluation of your symptoms and recheck.   Return instructions:  SEEK IMMEDIATE MEDICAL ATTENTION IF: The pain does not go away or becomes severe  A temperature above 101F develops  Repeated vomiting occurs (multiple episodes)  The pain becomes localized to portions of the abdomen. The right side could possibly be appendicitis. In an adult, the left lower portion of the abdomen could be colitis or diverticulitis.  Blood is being passed in stools or vomit (bright red or black tarry stools)  You develop chest pain, difficulty breathing, dizziness or fainting, or become confused, poorly responsive, or inconsolable (young children) If you have any other emergent concerns regarding your health  Additional Information: Abdominal (belly) pain can be caused by many things. Your caregiver performed an examination and possibly ordered blood/urine tests and imaging (CT scan, x-rays, ultrasound). Many cases can be observed and treated at home after initial evaluation in the emergency department. Even though you are being discharged home, abdominal pain can be unpredictable. Therefore, you need a repeated exam if your pain does not resolve, returns, or worsens. Most patients with abdominal  pain don't have to be admitted to the hospital or have surgery, but serious problems like appendicitis and gallbladder attacks can start out as nonspecific pain. Many abdominal conditions cannot be diagnosed in one visit, so follow-up evaluations are very important.  Your vital signs today were: BP (!) 148/82 (BP Location: Right Arm)   Pulse 62   Temp 98.5 F (36.9 C) (Oral)   Resp 18   Ht '4\' 10"'$  (1.473 m)   Wt 67.6 kg   SpO2 97%   BMI 31.14 kg/m  If your blood pressure (bp) was elevated above 135/85 this visit, please have this repeated by your doctor within one month. --------------

## 2021-05-28 NOTE — ED Provider Notes (Signed)
[facility] EMERGENCY DEPARTMENT Provider Note   CSN: [id number] Arrival date & time: 05/28/21  1513     History Chief Complaint  Patient presents with   Abdominal Pain    Kimberly Edwards is a 80 y.o. female.  Patient with history of remote cholecystectomy presents to the emergency department for a 1 week history of lower abdominal pain.  This was associated with constipation.  No blood in the stool.  Patient has used a laxative which produced good results yesterday.  This morning the pain persisted, prompting emergency department visit today.  It is gradually becoming more severe.  She states that she feels "full" with eating and has been eating very little.  She denies vomiting, nausea.  No urinary symptoms.  No chest pain or shortness of breath.  Denies other history of surgeries.  No history of diverticulitis.  Onset of symptoms acute.      Past Medical History:  Diagnosis Date   Arthritis    Depression    Heart murmur    Hyperlipidemia    Hypertension    Knee joint replacement status, left 2003   PONV (postoperative nausea and vomiting)    Right knee DJD     Patient Active Problem List   Diagnosis Date Noted   Preventative health care 04/02/2021   Bilateral hearing loss 06/23/2020   Estrogen deficiency 06/23/2020   Fatigue 12/22/2018   Generalized anxiety disorder 12/22/2018   Obesity (BMI 30-39.9) 07/01/2013   Dysuria 12/10/2012   Nausea and vomiting 03/25/2012   UTI (lower urinary tract infection) E Coli 03/25/2012   Hypertension    Arthritis    Knee joint replacement status, left    Right knee DJD    HTN (hypertension) 12/09/2011   Hyperlipidemia 12/09/2011   Osteoarthritis 05/31/2011    Past Surgical History:  Procedure Laterality Date   BREAST BIOPSY  2000   marker put in   [place]     left knee   TONSILLECTOMY     TOTAL KNEE ARTHROPLASTY  03/23/2012   Procedure: TOTAL KNEE  ARTHROPLASTY;  Surgeon: Lorn Junes, MD;  Location: [facility];  Service: Orthopedics;  Laterality: Right;  right total knee arthroplasty     OB History   No obstetric history on file.     Family History  Problem Relation Age of Onset   Hypertension Father    Breast cancer Sister     Social History   Tobacco Use   Smoking status: Former    Packs/day: 0.10    Years: 50.00    Pack years: 5.00    Types: Cigarettes    Quit date: 03/23/2012    Years since quitting: 9.1   Smokeless tobacco: Never   Tobacco comments:    social and weekends alcohol  Substance Use Topics   Alcohol use: Yes    Comment: Wine on weekends   Drug use: No    Home Medications Prior to Admission medications   Medication Sig Start Date End Date Taking? Authorizing Provider  aspirin 81 MG tablet Take 81 mg by mouth daily.    [provider]  Calcium Carb-Cholecalciferol (CALCIUM 500 + D PO) Take 1 tablet by mouth in the morning and at bedtime.    [provider]  citalopram (CELEXA) 10 MG tablet Take 1 tablet (10 mg total) by mouth daily. 04/02/21   Roma Schanz R, DO  diltiazem (CARTIA XT)  300 MG 24 hr capsule Take 1 capsule (300 mg total) by mouth daily. 04/02/21   Roma Schanz R, DO  fish oil-omega-3 fatty acids 1000 MG capsule Take 2 g by mouth daily.     [provider]  GLUCOSAMINE-CHONDROITIN PO Take 1 tablet by mouth daily. '1500mg'$  of glucosamine    [provider]  lisinopril (ZESTRIL) 30 MG tablet Take 1 tablet (30 mg total) by mouth daily. 04/02/21   Ann Held, DO  meloxicam (MOBIC) 15 MG tablet Take 1 tablet (15 mg total) by mouth daily. 04/02/21   Ann Held, DO  Multiple Vitamins-Minerals (MULTIVITAMIN WITH MINERALS) tablet Take 2 tablets by mouth daily. Vitafusion gummie    [provider]  Polyethyl Glycol-Propyl Glycol 0.4-0.3 % SOLN Apply 1 drop to eye daily.     [provider]  pravastatin (PRAVACHOL) 40  MG tablet Take 1 tablet (40 mg total) by mouth daily. 04/02/21   Ann Held, DO  triamterene-hydrochlorothiazide (DYAZIDE) 37.5-25 MG capsule Take 1 each (1 capsule total) by mouth daily. 04/02/21   Ann Held, DO    Allergies    Oxycodone  Review of Systems   Review of Systems  Constitutional:  Positive for appetite change. Negative for fever.  HENT:  Negative for rhinorrhea and sore throat.   Eyes:  Negative for redness.  Respiratory:  Negative for cough.   Cardiovascular:  Negative for chest pain.  Gastrointestinal:  Positive for abdominal pain and constipation. Negative for diarrhea, nausea and vomiting.  Genitourinary:  Negative for dysuria, frequency, hematuria and urgency.  Musculoskeletal:  Negative for myalgias.  Skin:  Negative for rash.  Neurological:  Negative for headaches.   Physical Exam Updated Vital Signs BP (!) 143/88 (BP Location: Left Arm)   Pulse 67   Temp 98.5 F (36.9 C) (Oral)   Resp 20   Ht '4\' 10"'$  (1.473 m)   Wt 67.6 kg   SpO2 99%   BMI 31.14 kg/m   Physical Exam Vitals and nursing note reviewed.  Constitutional:      General: She is not in acute distress.    Appearance: She is well-developed.  HENT:     Head: Normocephalic and atraumatic.     Right Ear: External ear normal.     Left Ear: External ear normal.     Nose: Nose normal.  Eyes:     Conjunctiva/sclera: Conjunctivae normal.  Cardiovascular:     Rate and Rhythm: Normal rate and regular rhythm.     Heart sounds: No murmur heard. Pulmonary:     Effort: No respiratory distress.     Breath sounds: No wheezing, rhonchi or rales.  Abdominal:     Palpations: Abdomen is soft.     Tenderness: There is abdominal tenderness (mild to moderate) in the right lower quadrant, epigastric area, periumbilical area, suprapubic area and left lower quadrant. There is no guarding or rebound.  Musculoskeletal:     Cervical back: Normal range of motion and neck supple.     Right  lower leg: No edema.     Left lower leg: No edema.  Skin:    General: Skin is warm and dry.     Findings: No rash.  Neurological:     General: No focal deficit present.     Mental Status: She is alert. Mental status is at baseline.     Motor: No weakness.  Psychiatric:        Mood  and Affect: Mood normal.    ED Results / Procedures / Treatments   Labs (all labs ordered are listed, but only abnormal results are displayed) Labs Reviewed  CBC WITH DIFFERENTIAL/PLATELET - Abnormal; Notable for the following components:      Result Value   WBC 18.8 (*)    Hemoglobin 15.6 (*)    Neutro Abs 14.8 (*)    Monocytes Absolute 1.2 (*)    Abs Immature Granulocytes 0.21 (*)    All other components within normal limits  COMPREHENSIVE METABOLIC PANEL - Abnormal; Notable for the following components:   Glucose, Bld 122 (*)    BUN 43 (*)    Creatinine, Ser 1.13 (*)    GFR, Estimated 49 (*)    All other components within normal limits  LIPASE, BLOOD  URINALYSIS, ROUTINE W REFLEX MICROSCOPIC    EKG None  Radiology CT Abdomen Pelvis W Contrast  Result Date: 05/28/2021 CLINICAL DATA:  Abdomen pain for 6 days EXAM: CT ABDOMEN AND PELVIS WITH CONTRAST TECHNIQUE: Multidetector CT imaging of the abdomen and pelvis was performed using the standard protocol following bolus administration of intravenous contrast. CONTRAST:  169m OMNIPAQUE IOHEXOL 300 MG/ML  SOLN COMPARISON:  None. FINDINGS: Lower chest: Lung bases demonstrate no acute consolidation or effusion. Normal cardiac size. Small hiatal hernia. Hepatobiliary: Status post cholecystectomy. Mild intra and extrahepatic biliary dilatation likely due to post cholecystectomy change. Pancreas: Unremarkable. No pancreatic ductal dilatation or surrounding inflammatory changes. Spleen: Normal in size without focal abnormality. Adrenals/Urinary Tract: Adrenal glands are normal. Cyst in the upper pole left kidney. Subcentimeter cortical hypodense kidney  lesions too small to further characterize. The bladder is normal Stomach/Bowel: Slightly distorted appearance of the stomach at the GE junction, question prior fundoplication, correlate with surgical history. No dilated small bowel. No acute bowel wall thickening. Left colon diverticular disease without convincing wall thickening. Negative appendix Vascular/Lymphatic: Moderate aortic atherosclerosis. No aneurysm. No suspicious nodes Reproductive: Uterus and bilateral adnexa are unremarkable. Other: Negative for free air or free fluid. Musculoskeletal: Degenerative changes of the spine. No acute osseous abnormality. IMPRESSION: 1. No CT evidence for acute intra-abdominal or pelvic abnormality. 2. 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 Electronically Signed   By: KDonavan FoilM.D.   On: 05/28/2021 17:47    Procedures Procedures   Medications Ordered in ED Medications - No data to display  ED Course  I have reviewed the triage vital signs and the nursing notes.  Pertinent labs & imaging results that were available during my care of the patient were reviewed by me and considered in my medical decision making (see chart for details).  Patient seen and examined. Work-up initiated. CT imaging will be required.   Vital signs reviewed and are as follows: BP (!) 143/88 (BP Location: Left Arm)   Pulse 67   Temp 98.5 F (36.9 C) (Oral)   Resp 20   Ht '4\' 10"'$  (1.473 m)   Wt 67.6 kg   SpO2 99%   BMI 31.14 kg/m   5:53 PM Imaging reviewed.  Imaging reassuring.  Patient updated on results.  Also discussed with daughter, now at bedside.  Will PO challenge.   Passed PO challenge. Stressed importance of good hydration.   Plan for discharge home with close outpatient follow-up.  The patient was urged to return to the Emergency Department immediately with worsening of current symptoms, worsening abdominal pain, persistent vomiting, blood noted in stools, fever, or  any other concerns. The patient  verbalized understanding.    MDM Rules/Calculators/A&P                           Patient with abdominal pain. Vitals are stable, no fever. Labs with elevated WBC. Imaging CT without concerning findings. No signs of dehydration, patient is tolerating PO's. Lungs are clear and no signs suggestive of PNA. Low concern for appendicitis, cholecystitis, pancreatitis, ruptured viscus, UTI, kidney stone, aortic dissection, aortic aneurysm or other emergent abdominal etiology. Supportive therapy indicated with return if symptoms worsen.     Final Clinical Impression(s) / ED Diagnoses Final diagnoses:  Generalized abdominal pain    Rx / DC Orders ED Discharge Orders     None        Carlisle Cater, PA-C 05/28/21 1852    Fredia Sorrow, MD 06/01/21 [phone]

## 2021-05-28 NOTE — ED Notes (Signed)
Pt tolerating PO challenge.

## 2021-05-30 ENCOUNTER — Emergency Department (HOSPITAL_BASED_OUTPATIENT_CLINIC_OR_DEPARTMENT_OTHER): Payer: PPO

## 2021-05-30 ENCOUNTER — Encounter (HOSPITAL_BASED_OUTPATIENT_CLINIC_OR_DEPARTMENT_OTHER): Payer: Self-pay

## 2021-05-30 ENCOUNTER — Emergency Department (HOSPITAL_BASED_OUTPATIENT_CLINIC_OR_DEPARTMENT_OTHER)
Admission: EM | Admit: 2021-05-30 | Discharge: 2021-05-30 | Disposition: A | Discharge status: Home / Self Care | Payer: PPO | Attending: Emergency Medicine | Admitting: Emergency Medicine

## 2021-05-30 ENCOUNTER — Other Ambulatory Visit: Payer: Self-pay

## 2021-05-30 DIAGNOSIS — Z87891 Personal history of nicotine dependence: Secondary | ICD-10-CM | POA: Diagnosis not present

## 2021-05-30 DIAGNOSIS — Y92810 Car as the place of occurrence of the external cause: Secondary | ICD-10-CM | POA: Diagnosis not present

## 2021-05-30 DIAGNOSIS — S199XXA Unspecified injury of neck, initial encounter: Secondary | ICD-10-CM | POA: Diagnosis not present

## 2021-05-30 DIAGNOSIS — S0990XA Unspecified injury of head, initial encounter: Secondary | ICD-10-CM | POA: Diagnosis not present

## 2021-05-30 DIAGNOSIS — M25532 Pain in left wrist: Secondary | ICD-10-CM | POA: Diagnosis not present

## 2021-05-30 DIAGNOSIS — Z043 Encounter for examination and observation following other accident: Secondary | ICD-10-CM | POA: Diagnosis not present

## 2021-05-30 DIAGNOSIS — M1812 Unilateral primary osteoarthritis of first carpometacarpal joint, left hand: Secondary | ICD-10-CM | POA: Diagnosis not present

## 2021-05-30 DIAGNOSIS — R0781 Pleurodynia: Secondary | ICD-10-CM

## 2021-05-30 DIAGNOSIS — Z7982 Long term (current) use of aspirin: Secondary | ICD-10-CM | POA: Insufficient documentation

## 2021-05-30 DIAGNOSIS — S01511A Laceration without foreign body of lip, initial encounter: Secondary | ICD-10-CM | POA: Insufficient documentation

## 2021-05-30 DIAGNOSIS — M4319 Spondylolisthesis, multiple sites in spine: Secondary | ICD-10-CM | POA: Diagnosis not present

## 2021-05-30 DIAGNOSIS — Z96652 Presence of left artificial knee joint: Secondary | ICD-10-CM | POA: Diagnosis not present

## 2021-05-30 DIAGNOSIS — Z79899 Other long term (current) drug therapy: Secondary | ICD-10-CM | POA: Insufficient documentation

## 2021-05-30 DIAGNOSIS — W01198A Fall on same level from slipping, tripping and stumbling with subsequent striking against other object, initial encounter: Secondary | ICD-10-CM | POA: Diagnosis not present

## 2021-05-30 DIAGNOSIS — S0512XA Contusion of eyeball and orbital tissues, left eye, initial encounter: Secondary | ICD-10-CM | POA: Insufficient documentation

## 2021-05-30 DIAGNOSIS — S0083XA Contusion of other part of head, initial encounter: Secondary | ICD-10-CM | POA: Diagnosis not present

## 2021-05-30 DIAGNOSIS — K029 Dental caries, unspecified: Secondary | ICD-10-CM | POA: Diagnosis not present

## 2021-05-30 DIAGNOSIS — W19XXXA Unspecified fall, initial encounter: Secondary | ICD-10-CM

## 2021-05-30 DIAGNOSIS — S0993XA Unspecified injury of face, initial encounter: Secondary | ICD-10-CM | POA: Diagnosis not present

## 2021-05-30 DIAGNOSIS — Z23 Encounter for immunization: Secondary | ICD-10-CM | POA: Insufficient documentation

## 2021-05-30 DIAGNOSIS — I10 Essential (primary) hypertension: Secondary | ICD-10-CM | POA: Insufficient documentation

## 2021-05-30 MED ORDER — AMOXICILLIN-POT CLAVULANATE 875-125 MG PO TABS: 1.0000 | ORAL_TABLET | Freq: Two times a day (BID) | ORAL | 0 refills | Status: DC

## 2021-05-30 MED ORDER — ACETAMINOPHEN 500 MG PO TABS
1000.0000 mg | ORAL_TABLET | Freq: Once | ORAL | Status: AC
Start: 2021-05-30 — End: 2021-05-30
  Administered 2021-05-30: 1000 mg via ORAL
  Filled 2021-05-30: qty 2

## 2021-05-30 MED ORDER — TETANUS-DIPHTH-ACELL PERTUSSIS 5-2.5-18.5 LF-MCG/0.5 IM SUSY
0.5000 mL | PREFILLED_SYRINGE | Freq: Once | INTRAMUSCULAR | Status: AC
  Administered 2021-05-30: 0.5 mL via INTRAMUSCULAR
  Filled 2021-05-30: qty 0.5

## 2021-05-30 NOTE — ED Triage Notes (Signed)
Pt states that she was getting back into the car today after taking her car to the groomer and tripped over uneven asphalt in the parking lot. Pt knocked out front teeth, and has swelling and abrasion to left upper eye, denies taking a blood thinner, does use baby Asprin daily.

## 2021-05-30 NOTE — ED Provider Notes (Signed)
[place] EMERGENCY DEPARTMENT Provider Note   CSN: [id number] Arrival date & time: 05/30/21  1032     History Chief Complaint  Patient presents with   Lytle Michaels    Kimberly Edwards is a 80 y.o. female with PMHx HTN, HLD who presents to the ED today s/p mechanical fall that occurred just PTA.  Patient states that she getting back in her car today and tripped over some uneven pavement causing her to fall and hit her face on the concrete.  No loss of consciousness and is not anticoagulated.  She reports that she knocked her dental implant as well as her partial upper dentures out.  She denies knocking out any of her own teeth.  She complains of pain to the left frontal area where she has an abrasion.  She also complains of pain to her left lower ribs and left wrist.  She did not take anything for pain prior to arrival.  No other complaints at this time.  She states she needs an updated tetanus shot today.  Denies any prodrome to the fall.   The history is provided by the patient, medical records and a relative.      Past Medical History:  Diagnosis Date   Arthritis    Depression    Heart murmur    Hyperlipidemia    Hypertension    Knee joint replacement status, left 2003   PONV (postoperative nausea and vomiting)    Right knee DJD     Patient Active Problem List   Diagnosis Date Noted   Preventative health care 04/02/2021   Bilateral hearing loss 06/23/2020   Estrogen deficiency 06/23/2020   Fatigue 12/22/2018   Generalized anxiety disorder 12/22/2018   Obesity (BMI 30-39.9) 07/01/2013   Dysuria 12/10/2012   Nausea and vomiting 03/25/2012   UTI (lower urinary tract infection) E Coli 03/25/2012   Hypertension    Arthritis    Knee joint replacement status, left    Right knee DJD    HTN (hypertension) 12/09/2011   Hyperlipidemia 12/09/2011   Osteoarthritis 05/31/2011    Past Surgical History:  Procedure Laterality Date   BREAST BIOPSY  2000   marker put in    [place]     left knee   TONSILLECTOMY     TOTAL KNEE ARTHROPLASTY  03/23/2012   Procedure: TOTAL KNEE ARTHROPLASTY;  Surgeon: Lorn Junes, MD;  Location: [facility];  Service: Orthopedics;  Laterality: Right;  right total knee arthroplasty     OB History   No obstetric history on file.     Family History  Problem Relation Age of Onset   Hypertension Father    Breast cancer Sister     Social History   Tobacco Use   Smoking status: Former    Packs/day: 0.10    Years: 50.00    Pack years: 5.00    Types: Cigarettes    Quit date: 03/23/2012    Years since quitting: 9.1   Smokeless tobacco: Never   Tobacco comments:    social and weekends alcohol  Substance Use Topics   Alcohol use: Yes    Comment: Wine on weekends   Drug use: No    Home Medications Prior to Admission medications   Medication Sig Start Date End Date Taking? Authorizing Provider  amoxicillin-clavulanate (AUGMENTIN) 875-125 MG tablet Take 1 tablet by mouth every 12 (twelve) hours. 05/30/21  Yes Eustaquio Maize, PA-C  aspirin 81 MG tablet Take 81 mg by mouth daily.    [provider]  Calcium Carb-Cholecalciferol (CALCIUM 500 + D PO) Take 1 tablet by mouth in the morning and at bedtime.    [provider]  citalopram (CELEXA) 10 MG tablet Take 1 tablet (10 mg total) by mouth daily. 04/02/21   Ann Held, DO  diltiazem (CARTIA XT) 300 MG 24 hr capsule Take 1 capsule (300 mg total) by mouth daily. 04/02/21   Roma Schanz R, DO  fish oil-omega-3 fatty acids 1000 MG capsule Take 2 g by mouth daily.     [provider]  GLUCOSAMINE-CHONDROITIN PO Take 1 tablet by mouth daily. '1500mg'$  of glucosamine    [provider]  lisinopril (ZESTRIL) 30 MG tablet Take 1 tablet (30 mg total) by mouth daily. 04/02/21   Ann Held, DO  meloxicam (MOBIC) 15 MG tablet Take 1 tablet (15 mg total) by mouth daily.  04/02/21   Ann Held, DO  Multiple Vitamins-Minerals (MULTIVITAMIN WITH MINERALS) tablet Take 2 tablets by mouth daily. Vitafusion gummie    [provider]  Polyethyl Glycol-Propyl Glycol 0.4-0.3 % SOLN Apply 1 drop to eye daily.     [provider]  pravastatin (PRAVACHOL) 40 MG tablet Take 1 tablet (40 mg total) by mouth daily. 04/02/21   Ann Held, DO  triamterene-hydrochlorothiazide (DYAZIDE) 37.5-25 MG capsule Take 1 each (1 capsule total) by mouth daily. 04/02/21   Ann Held, DO    Allergies    Oxycodone  Review of Systems   Review of Systems  Constitutional:  Negative for chills and fever.  Eyes:  Negative for visual disturbance.  Cardiovascular:  Negative for chest pain.  Gastrointestinal:  Negative for nausea and vomiting.  Skin:  Positive for wound.  Neurological:  Positive for headaches. Negative for syncope.  All other systems reviewed and are negative.  Physical Exam Updated Vital Signs BP 110/65 (BP Location: Right Arm)   Pulse (!) 59   Temp 98.6 F (37 C) (Oral)   Resp 18   Ht '4\' 10"'$  (1.473 m)   Wt 67.6 kg   SpO2 95%   BMI 31.14 kg/m   Physical Exam Vitals and nursing note reviewed.  Constitutional:      Appearance: She is not ill-appearing.  HENT:     Head: Normocephalic.     Comments: Abrasion to left forehead/lateral aspect of eyebrow with TTP.  EOMI. PERRL. No hyphema.  + swelling/superficial laceration to left lower lip, bleeding controlled Multiple dental extraction sites present s/2 partial dentures/dental implant that has fallen out, no bleeding appreciated Eyes:     Conjunctiva/sclera: Conjunctivae normal.  Cardiovascular:     Rate and Rhythm: Normal rate and regular rhythm.     Pulses: Normal pulses.  Pulmonary:     Effort: Pulmonary effort is normal.     Breath sounds: Normal breath sounds. No wheezing, rhonchi or rales.     Comments: + left lateral rib TTP without crepitus Chest:      Chest wall: Tenderness present.  Abdominal:     Palpations: Abdomen is soft.     Tenderness: There is no abdominal tenderness.  Musculoskeletal:     Cervical back: Neck supple. No tenderness.     Comments: No T or L midline spinal TTP.  Mild swelling and TTP to the left wrist and left hand. ROM intact. Able to wiggle fingers without difficulty. Cap refill <  2 seconds. 2+ radial pulse.  Pelvis stable.  No TTP to BLEs. ROM intact to both.   Skin:    General: Skin is warm and dry.  Neurological:     General: No focal deficit present.     Mental Status: She is alert and oriented to person, place, and time.    ED Results / Procedures / Treatments   Labs (all labs ordered are listed, but only abnormal results are displayed) Labs Reviewed - No data to display  EKG None  Radiology DG Ribs Unilateral W/Chest Left  Result Date: 05/30/2021 CLINICAL DATA:  Status post fall. EXAM: LEFT RIBS AND CHEST - 3+ VIEW COMPARISON:  03/27/2014 FINDINGS: Normal cardiomediastinal contours. No pleural effusion or edema. No airspace opacities or pneumothorax identified. No displaced rib fractures identified. Degenerative changes noted within both glenohumeral joints. Thoracolumbar scoliosis with multilevel degenerative disc disease. IMPRESSION: 1. No acute cardiopulmonary abnormalities. 2. No displaced rib fractures identified. Electronically Signed   By: Kerby Moors M.D.   On: 05/30/2021 12:11   DG Wrist Complete Left  Result Date: 05/30/2021 CLINICAL DATA:  Fall EXAM: LEFT WRIST - COMPLETE 3+ VIEW; LEFT HAND - COMPLETE 3+ VIEW COMPARISON:  None. FINDINGS: Left wrist and hand: No evidence of acute fracture or dislocation. Advanced degenerative changes of the first [facility] and triscaphe joints with subluxation of the first [facility] joint. Moderate degenerative changes of the first IP joint and distal second IP joint. Severe degenerative changes of the fifth distal IP joint. Chondrocalcinosis. Osteopenia. Soft tissues  are unremarkable. IMPRESSION: No evidence of acute fracture or dislocation, although osteopenia somewhat limits evaluation. If high clinical suspicion for fracture persists, repeat radiographs in 3-7 days can be performed to assess for a healing radiographically occult fracture. Advanced degenerative changes of the first [facility] and triscaphe joints. 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 Electronically Signed   By: Yetta Glassman M.D.   On: 05/30/2021 12:16   CT Head Wo Contrast  Result Date: 05/30/2021 CLINICAL DATA:  Fall with facial trauma. EXAM: CT HEAD WITHOUT CONTRAST CT MAXILLOFACIAL WITHOUT CONTRAST CT CERVICAL SPINE WITHOUT CONTRAST TECHNIQUE: Multidetector CT imaging of the head, cervical spine, and maxillofacial structures were performed using the standard protocol without intravenous contrast. Multiplanar CT image reconstructions of the cervical spine and maxillofacial structures were also generated. COMPARISON:  None. FINDINGS: CT HEAD FINDINGS Brain: No evidence of acute infarction, hemorrhage, hydrocephalus, extra-axial collection or mass lesion/mass effect. Vascular: No hyperdense vessel or unexpected calcification. Skull: Normal. Negative for fracture or focal lesion. CT MAXILLOFACIAL FINDINGS Osseous: Multiple missing teeth with extensive cavities and periapical erosions to remaining maxillary teeth. There may have been recent traumatic extraction given the size and degree of gas within bilateral maxillary incisor sockets. Orbits: No evidence of injury.  Bilateral cataract resection Sinuses: Negative for hemosinus Soft tissues: Chin/lower lip swelling and left cheek contusion. Left periorbital contusion. No opaque foreign body. CT CERVICAL SPINE FINDINGS Alignment: Degenerative associated anterolisthesis at C3-4, C6-7, C7-T1, and T1-2. Skull base and vertebrae: No acute fracture. Soft tissues and spinal canal: No prevertebral fluid or swelling. No visible canal  hematoma. Disc levels: Severe and generalized degenerative disc collapse with endplate irregularity and spurring. Diffuse advanced facet osteoarthritis. Upper chest: No visible injury. IMPRESSION: 1. No evidence of acute intracranial injury or cervical spine fracture. 2. Facial contusions without acute fracture. 3. Extensive caries of remaining teeth with periapical erosions. There may have been recent traumatic extraction of maxillary teeth. Electronically Signed  By: Monte Fantasia M.D.   On: 05/30/2021 11:55   CT Cervical Spine Wo Contrast  Result Date: 05/30/2021 CLINICAL DATA:  Fall with facial trauma. EXAM: CT HEAD WITHOUT CONTRAST CT MAXILLOFACIAL WITHOUT CONTRAST CT CERVICAL SPINE WITHOUT CONTRAST TECHNIQUE: Multidetector CT imaging of the head, cervical spine, and maxillofacial structures were performed using the standard protocol without intravenous contrast. Multiplanar CT image reconstructions of the cervical spine and maxillofacial structures were also generated. COMPARISON:  None. FINDINGS: CT HEAD FINDINGS Brain: No evidence of acute infarction, hemorrhage, hydrocephalus, extra-axial collection or mass lesion/mass effect. Vascular: No hyperdense vessel or unexpected calcification. Skull: Normal. Negative for fracture or focal lesion. CT MAXILLOFACIAL FINDINGS Osseous: Multiple missing teeth with extensive cavities and periapical erosions to remaining maxillary teeth. There may have been recent traumatic extraction given the size and degree of gas within bilateral maxillary incisor sockets. Orbits: No evidence of injury.  Bilateral cataract resection Sinuses: Negative for hemosinus Soft tissues: Chin/lower lip swelling and left cheek contusion. Left periorbital contusion. No opaque foreign body. CT CERVICAL SPINE FINDINGS Alignment: Degenerative associated anterolisthesis at C3-4, C6-7, C7-T1, and T1-2. Skull base and vertebrae: No acute fracture. Soft tissues and spinal canal: No prevertebral  fluid or swelling. No visible canal hematoma. Disc levels: Severe and generalized degenerative disc collapse with endplate irregularity and spurring. Diffuse advanced facet osteoarthritis. Upper chest: No visible injury. IMPRESSION: 1. No evidence of acute intracranial injury or cervical spine fracture. 2. Facial contusions without acute fracture. 3. Extensive caries of remaining teeth with periapical erosions. There may have been recent traumatic extraction of maxillary teeth. Electronically Signed   By: Monte Fantasia M.D.   On: 05/30/2021 11:55   CT Abdomen Pelvis W Contrast  Result Date: 05/28/2021 CLINICAL DATA:  Abdomen pain for 6 days EXAM: CT ABDOMEN AND PELVIS WITH CONTRAST TECHNIQUE: Multidetector CT imaging of the abdomen and pelvis was performed using the standard protocol following bolus administration of intravenous contrast. CONTRAST:  161m OMNIPAQUE IOHEXOL 300 MG/ML  SOLN COMPARISON:  None. FINDINGS: Lower chest: Lung bases demonstrate no acute consolidation or effusion. Normal cardiac size. Small hiatal hernia. Hepatobiliary: Status post cholecystectomy. Mild intra and extrahepatic biliary dilatation likely due to post cholecystectomy change. Pancreas: Unremarkable. No pancreatic ductal dilatation or surrounding inflammatory changes. Spleen: Normal in size without focal abnormality. Adrenals/Urinary Tract: Adrenal glands are normal. Cyst in the upper pole left kidney. Subcentimeter cortical hypodense kidney lesions too small to further characterize. The bladder is normal Stomach/Bowel: Slightly distorted appearance of the stomach at the GE junction, question prior fundoplication, correlate with surgical history. No dilated small bowel. No acute bowel wall thickening. Left colon diverticular disease without convincing wall thickening. Negative appendix Vascular/Lymphatic: Moderate aortic atherosclerosis. No aneurysm. No suspicious nodes Reproductive: Uterus and bilateral adnexa are  unremarkable. Other: Negative for free air or free fluid. Musculoskeletal: Degenerative changes of the spine. No acute osseous abnormality. IMPRESSION: 1. No CT evidence for acute intra-abdominal or pelvic abnormality. 2. 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 Electronically Signed   By: KDonavan FoilM.D.   On: 05/28/2021 17:47   DG Hand Complete Left  Result Date: 05/30/2021 CLINICAL DATA:  Fall EXAM: LEFT WRIST - COMPLETE 3+ VIEW; LEFT HAND - COMPLETE 3+ VIEW COMPARISON:  None. FINDINGS: Left wrist and hand: No evidence of acute fracture or dislocation. Advanced degenerative changes of the first [facility]aphe joints with subluxation of the first [facility]. Moderate degenerative changes of the first IP joint and distal  second IP joint. Severe degenerative changes of the fifth distal IP joint. Chondrocalcinosis. Osteopenia. Soft tissues are unremarkable. IMPRESSION: No evidence of acute fracture or dislocation, although osteopenia somewhat limits evaluation. If high clinical suspicion for fracture persists, repeat radiographs in 3-7 days can be performed to assess for a healing radiographically occult fracture. Advanced degenerative changes of the first [facility] and triscaphe joints. 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 Electronically Signed   By: Yetta Glassman M.D.   On: 05/30/2021 12:16   CT Maxillofacial WO CM  Result Date: 05/30/2021 CLINICAL DATA:  Fall with facial trauma. EXAM: CT HEAD WITHOUT CONTRAST CT MAXILLOFACIAL WITHOUT CONTRAST CT CERVICAL SPINE WITHOUT CONTRAST TECHNIQUE: Multidetector CT imaging of the head, cervical spine, and maxillofacial structures were performed using the standard protocol without intravenous contrast. Multiplanar CT image reconstructions of the cervical spine and maxillofacial structures were also generated. COMPARISON:  None. FINDINGS: CT HEAD FINDINGS Brain: No evidence of acute  infarction, hemorrhage, hydrocephalus, extra-axial collection or mass lesion/mass effect. Vascular: No hyperdense vessel or unexpected calcification. Skull: Normal. Negative for fracture or focal lesion. CT MAXILLOFACIAL FINDINGS Osseous: Multiple missing teeth with extensive cavities and periapical erosions to remaining maxillary teeth. There may have been recent traumatic extraction given the size and degree of gas within bilateral maxillary incisor sockets. Orbits: No evidence of injury.  Bilateral cataract resection Sinuses: Negative for hemosinus Soft tissues: Chin/lower lip swelling and left cheek contusion. Left periorbital contusion. No opaque foreign body. CT CERVICAL SPINE FINDINGS Alignment: Degenerative associated anterolisthesis at C3-4, C6-7, C7-T1, and T1-2. Skull base and vertebrae: No acute fracture. Soft tissues and spinal canal: No prevertebral fluid or swelling. No visible canal hematoma. Disc levels: Severe and generalized degenerative disc collapse with endplate irregularity and spurring. Diffuse advanced facet osteoarthritis. Upper chest: No visible injury. IMPRESSION: 1. No evidence of acute intracranial injury or cervical spine fracture. 2. Facial contusions without acute fracture. 3. Extensive caries of remaining teeth with periapical erosions. There may have been recent traumatic extraction of maxillary teeth. Electronically Signed   By: Monte Fantasia M.D.   On: 05/30/2021 11:55    Procedures Procedures   Medications Ordered in ED Medications  Tdap (BOOSTRIX) injection 0.5 mL (0.5 mLs Intramuscular Given 05/30/21 1104)  acetaminophen (TYLENOL) tablet 1,000 mg (1,000 mg Oral Given 05/30/21 1103)    ED Course  I have reviewed the triage vital signs and the nursing notes.  Pertinent labs & imaging results that were available during my care of the patient were reviewed by me and considered in my medical decision making (see chart for details).    MDM Rules/Calculators/A&P                            80 year old female who presents to the ED today status post mechanical fall that occurred just prior to arrival.  Complaining of pain to her left forehead, left ribs, left wrist.  Patient did not occur dental implant out from the upper left portion of her mouth.  Partial denture removed as well.  She denies syncope or anticoagulation.  Is alert and oriented x4 currently.  She does have tenderness palpation to her left forehead, left wrist, left ribs.  She does have some swelling and superficial laceration to the lower left lip, will not require sutures at this time however will plan to cover patient with antibiotics to prevent infection.  We will update tetanus at this time.  Will  provide pain medication.   CT: IMPRESSION:  1. No evidence of acute intracranial injury or cervical spine  fracture.  2. Facial contusions without acute fracture.  3. Extensive caries of remaining teeth with periapical erosions.  There may have been recent traumatic extraction of maxillary teeth.   Xray of wrist/hand: IMPRESSION:  No evidence of acute fracture or dislocation, although osteopenia  somewhat limits evaluation. If high clinical suspicion for fracture  persists, repeat radiographs in 3-7 days can be performed to assess  for a healing radiographically occult fracture.     Advanced degenerative changes of the first [facility] and triscaphe joints.     Chondrocalcinosis, findings can be seen the setting of CPPD  arthropathy.   Xray of ribs negative for fractures  Workup overall reassuring at this time. Will plan for discharge home with PCP follow up. Have recommend repeat xray of wrist in 1 week if pain persists. RICE therapy discussed with pt. She will need to follow up with her oral surgeon given dental implant that has fallen out from trauma today. Will discharge with augmentin to cover for infection of trauma to lip. Pt in agreement with plan and stable for discharge home.   This  note was prepared using Dragon voice recognition software and may include unintentional dictation errors due to the inherent limitations of voice recognition software.   Final Clinical Impression(s) / ED Diagnoses Final diagnoses:  Fall, initial encounter  Injury of head, initial encounter  Contusion of left orbital tissues, initial encounter  Left wrist pain  Rib pain    Rx / DC Orders ED Discharge Orders          Ordered    amoxicillin-clavulanate (AUGMENTIN) 875-125 MG tablet  Every 12 hours        05/30/21 1240             Discharge Instructions      Your workup was overall reassuring at this time without any signs of fractures or intracranial abnormalities including brain bleed.  Your wrist xray did show findings of osteopenia but no fracture - if your pain persists > 1 week it is recommended that you have a repeat xray performed  Please pick up the antibiotics and take as prescribed to cover for infection given the trauma to your lip from biting down on the area.   While at home please rest, ice, and elevate your wrist to help with pain/inflammation. You should also ice your forehead and ribs. Take Tylenol as needed for pain.   Follow up with your PCP regarding ED visit today. You will also need to follow up with your oral surgeon.   Return to the ED for any new/worsening symptoms       Eustaquio Maize, Hershal Coria 05/30/21 1243    Sherwood Gambler, MD 06/02/21 [phone]

## 2021-05-30 NOTE — Discharge Instructions (Addendum)
Your workup was overall reassuring at this time without any signs of fractures or intracranial abnormalities including brain bleed.  Your wrist xray did show findings of osteopenia but no fracture - if your pain persists > 1 week it is recommended that you have a repeat xray performed  Please pick up the antibiotics and take as prescribed to cover for infection given the trauma to your lip from biting down on the area.   While at home please rest, ice, and elevate your wrist to help with pain/inflammation. You should also ice your forehead and ribs. Take Tylenol as needed for pain.   Follow up with your PCP regarding ED visit today. You will also need to follow up with your oral surgeon.   Return to the ED for any new/worsening symptoms

## 2021-05-30 NOTE — ED Notes (Signed)
Patient transported to X-ray 

## 2021-05-31 ENCOUNTER — Inpatient Hospital Stay: Payer: PPO | Admitting: Family Medicine

## 2021-06-06 ENCOUNTER — Encounter: Payer: Self-pay | Admitting: Family Medicine

## 2021-06-06 NOTE — Progress Notes (Signed)
Kimberly Edwards at [facility] [address], [place], [place] [address] [phone]  Date:  06/07/2021   Name:  Kimberly Edwards   DOB:  Aug 14, 1941   MRN:  [id number]  PCP:  Ann Held, DO    Chief Complaint: No chief complaint on file.   History of Present Illness:  [name] is a 80 y.o. very pleasant female patient who presents with the following:  Virtual visit today for covid 19 Primary pt of Dr Etter Sjogren I have not seen her myself previously Pt ID confirmed with 2 factors today, the pt and myself are present on the call Pt location is home and I am at office  History of hearing loss, HTN, hyperlipidemia  She was in the ER twice recently- once for abd pain and once following a fall   She started having sx of covid 19 yesterday- HA She felt hot, was sweating and tested herself for COVID-19- positive yesterday Today she notes a HA and fatigue She feel congested in her chest Her nose is stuffy Mild cough  She does have a pulse-ox  She is vaccinated x4 Patient Active Problem List   Diagnosis Date Noted   Preventative health care 04/02/2021   Bilateral hearing loss 06/23/2020   Estrogen deficiency 06/23/2020   Fatigue 12/22/2018   Generalized anxiety disorder 12/22/2018   Obesity (BMI 30-39.9) 07/01/2013   Dysuria 12/10/2012   Nausea and vomiting 03/25/2012   UTI (lower urinary tract infection) E Coli 03/25/2012   Hypertension    Arthritis    Knee joint replacement status, left    Right knee DJD    HTN (hypertension) 12/09/2011   Hyperlipidemia 12/09/2011   Osteoarthritis 05/31/2011    Past Medical History:  Diagnosis Date   Arthritis    Depression    Heart murmur    Hyperlipidemia    Hypertension    Knee joint replacement status, left 2003   PONV (postoperative nausea and vomiting)    Right knee DJD     Past Surgical History:  Procedure Laterality Date   BREAST BIOPSY  2000   marker put in    [place]     left knee   TONSILLECTOMY     TOTAL KNEE ARTHROPLASTY  03/23/2012   Procedure: TOTAL KNEE ARTHROPLASTY;  Surgeon: Lorn Junes, MD;  Location: [facility];  Service: Orthopedics;  Laterality: Right;  right total knee arthroplasty    Social History   Tobacco Use   Smoking status: Former    Packs/day: 0.10    Years: 50.00    Pack years: 5.00    Types: Cigarettes    Quit date: 03/23/2012    Years since quitting: 9.2   Smokeless tobacco: Never   Tobacco comments:    social and weekends alcohol  Substance Use Topics   Alcohol use: Yes    Comment: Wine on weekends   Drug use: No    Family History  Problem Relation Age of Onset   Hypertension Father    Breast cancer Sister     Allergies  Allergen Reactions   Oxycodone Nausea And Vomiting    Medication list has been reviewed and updated.  Current Outpatient Medications on File Prior to Visit  Medication Sig Dispense Refill   amoxicillin-clavulanate (AUGMENTIN) 875-125 MG tablet Take 1 tablet by mouth every 12 (twelve) hours. 14 tablet 0  aspirin 81 MG tablet Take 81 mg by mouth daily.     Calcium Carb-Cholecalciferol (CALCIUM 500 + D PO) Take 1 tablet by mouth in the morning and at bedtime.     citalopram (CELEXA) 10 MG tablet Take 1 tablet (10 mg total) by mouth daily. 90 tablet 3   diltiazem (CARTIA XT) 300 MG 24 hr capsule Take 1 capsule (300 mg total) by mouth daily. 90 capsule 1   fish oil-omega-3 fatty acids 1000 MG capsule Take 2 g by mouth daily.      GLUCOSAMINE-CHONDROITIN PO Take 1 tablet by mouth daily. '1500mg'$  of glucosamine     lisinopril (ZESTRIL) 30 MG tablet Take 1 tablet (30 mg total) by mouth daily. 90 tablet 1   meloxicam (MOBIC) 15 MG tablet Take 1 tablet (15 mg total) by mouth daily. 90 tablet 1   Multiple Vitamins-Minerals (MULTIVITAMIN WITH MINERALS) tablet Take 2 tablets by mouth daily. Vitafusion gummie     Polyethyl  Glycol-Propyl Glycol 0.4-0.3 % SOLN Apply 1 drop to eye daily.      pravastatin (PRAVACHOL) 40 MG tablet Take 1 tablet (40 mg total) by mouth daily. 90 tablet 1   triamterene-hydrochlorothiazide (DYAZIDE) 37.5-25 MG capsule Take 1 each (1 capsule total) by mouth daily. 90 capsule 1   No current facility-administered medications on file prior to visit.    Review of Systems:  As per HPI- otherwise negative.   Physical Examination: There were no vitals filed for this visit. There were no vitals filed for this visit. There is no height or weight on file to calculate BMI. Ideal Body Weight:    Pt observed via video monitor.  She looks well except for contusions from recent fall.  No shortness of breath, cough, wheezing or distress is noted She does have a pulse oximeter but was not able to get it working right now  Temp 97.3 this am   Assessment and Plan: COVID-19 - Plan: molnupiravir EUA 200 mg CAPS Virtual visit today for COVID-19.  Patient does have risk factors of advanced age, obesity, hypertension.  She would like to use antivirals.  I called in a prescription for molnupiravir. Provided guidance for self-care at home, she will let us know if any concerns.  She is instructed to seek care right away if any distress or significant shortness of breath.    Signed Lamar Blinks, MD

## 2021-06-07 ENCOUNTER — Telehealth (INDEPENDENT_AMBULATORY_CARE_PROVIDER_SITE_OTHER): Payer: PPO | Admitting: Family Medicine

## 2021-06-07 ENCOUNTER — Other Ambulatory Visit: Payer: Self-pay

## 2021-06-07 VITALS — Temp 97.6°F

## 2021-06-07 DIAGNOSIS — U071 COVID-19: Secondary | ICD-10-CM

## 2021-06-07 MED ORDER — MOLNUPIRAVIR EUA 200MG CAPSULE: 4.0000 | ORAL_CAPSULE | Freq: Two times a day (BID) | ORAL | 0 refills | Status: AC

## 2021-06-08 NOTE — Telephone Encounter (Signed)
Pt had virtual w/ Copland yesterday

## 2021-06-08 NOTE — Telephone Encounter (Signed)
Pt called. LVM

## 2021-07-03 ENCOUNTER — Ambulatory Visit (INDEPENDENT_AMBULATORY_CARE_PROVIDER_SITE_OTHER): Payer: PPO | Admitting: Family Medicine

## 2021-07-03 ENCOUNTER — Other Ambulatory Visit: Payer: Self-pay

## 2021-07-03 ENCOUNTER — Encounter: Payer: Self-pay | Admitting: Family Medicine

## 2021-07-03 VITALS — BP 130/88 | HR 68 | Temp 98.3°F | Resp 18 | Ht <= 58 in | Wt 145.0 lb

## 2021-07-03 DIAGNOSIS — M5416 Radiculopathy, lumbar region: Secondary | ICD-10-CM | POA: Diagnosis not present

## 2021-07-03 DIAGNOSIS — E785 Hyperlipidemia, unspecified: Secondary | ICD-10-CM

## 2021-07-03 DIAGNOSIS — M48061 Spinal stenosis, lumbar region without neurogenic claudication: Secondary | ICD-10-CM | POA: Diagnosis not present

## 2021-07-03 DIAGNOSIS — I10 Essential (primary) hypertension: Secondary | ICD-10-CM

## 2021-07-03 DIAGNOSIS — Z23 Encounter for immunization: Secondary | ICD-10-CM | POA: Diagnosis not present

## 2021-07-03 DIAGNOSIS — M7062 Trochanteric bursitis, left hip: Secondary | ICD-10-CM | POA: Diagnosis not present

## 2021-07-03 LAB — COMPREHENSIVE METABOLIC PANEL
ALT: 10 U/L (ref 0–35)
AST: 14 U/L (ref 0–37)
Albumin: 4.1 g/dL (ref 3.5–5.2)
Alkaline Phosphatase: 61 U/L (ref 39–117)
BUN: 28 mg/dL — ABNORMAL HIGH (ref 6–23)
CO2: 27 mEq/L (ref 19–32)
Calcium: 9.6 mg/dL (ref 8.4–10.5)
Chloride: 105 mEq/L (ref 96–112)
Creatinine, Ser: 0.94 mg/dL (ref 0.40–1.20)
GFR: 56.65 mL/min — ABNORMAL LOW (ref >60.00)
Glucose, Bld: 88 mg/dL (ref 70–99)
Potassium: 4.1 mEq/L (ref 3.5–5.1)
Sodium: 142 mEq/L (ref 135–145)
Total Bilirubin: 0.7 mg/dL (ref 0.2–1.2)
Total Protein: 7.2 g/dL (ref 6.0–8.3)

## 2021-07-03 LAB — LIPID PANEL
Cholesterol: 173 mg/dL (ref 0–200)
HDL: 56.9 mg/dL (ref >39.00)
LDL Cholesterol: 92 mg/dL (ref 0–99)
NonHDL: 115.88
Total CHOL/HDL Ratio: 3
Triglycerides: 119 mg/dL (ref 0.0–149.0)
VLDL: 23.8 mg/dL (ref 0.0–40.0)

## 2021-07-03 NOTE — Progress Notes (Signed)
Subjective:   By signing my name below, I, Shehryar Baig, attest that this documentation has been prepared under the direction and in the presence of Ann Held, DO  07/03/2021    Patient ID: Kimberly Edwards, female    DOB: [birth year], 80 y.o.   MRN: [id number]  Chief Complaint  Patient presents with   Hypertension   Hyperlipidemia   Follow-up    HPI Patient is in today for a office visit.  She reports having two back to back visits to the emergency room. Her first visit was due to generalized abdominal pain on 05/30/2021 and the next visit and her next visit was due to losing her balance and falling on 05/30/2021. She denies having any rib pain at this time. She notes that during her fall her teeth had fallen out. She is getting her teeth managed by another provider and has an upcoming appointment this week. She is depressed due to losing her teeth and has decreased appetite because she cannot eat hard foods. She also reports her heart rate was decreased while in the hospital but she had no symptoms present during that time.  She then reports being testing positive with Covid-19 the weeks after her second ED visit. She was prescribed an anti-viral medication and found she only had mild symptoms.  She has an upcoming appointment with her orthopedist specialist to mange her back pain and help her balance.  She is interested in getting the new Covid-19 vaccine and is willing to wait one month after her previous Covid-19 diagnoses. She is interested in getting the flu vaccine during this visit. She is UTD on tetanus vaccines. She is eligible for the shingles vaccine and is not interested in getting it at this time.  Her diastolic blood pressure is slightly elevated during this visit. She continues taking 30 mg Zestril daily PO and reports no new issues while taking it.   BP Readings from Last 3 Encounters:  07/03/21 130/88  05/30/21 123/64  05/28/21 (!) 148/82   Pulse Readings  from Last 3 Encounters:  07/03/21 68  05/30/21 (!) 50  05/28/21 62    Past Medical History:  Diagnosis Date   Arthritis    Depression    Heart murmur    Hyperlipidemia    Hypertension    Knee joint replacement status, left 2003   PONV (postoperative nausea and vomiting)    Right knee DJD     Past Surgical History:  Procedure Laterality Date   BREAST BIOPSY  2000   marker put in   [place]     left knee   TONSILLECTOMY     TOTAL KNEE ARTHROPLASTY  03/23/2012   Procedure: TOTAL KNEE ARTHROPLASTY;  Surgeon: Lorn Junes, MD;  Location: [facility];  Service: Orthopedics;  Laterality: Right;  right total knee arthroplasty    Family History  Problem Relation Age of Onset   Hypertension Father    Breast cancer Sister     Social History   Socioeconomic History   Marital status: Widowed    Spouse name: Not on file   Number of children: Not on file   Years of education: Not on file   Highest education level: Not on file  Occupational History   Occupation: retired Theme park manager  Tobacco Use   Smoking status: Former    Packs/day: 0.10    Years: 50.00    Pack years: 5.00  Types: Cigarettes    Quit date: 03/23/2012    Years since quitting: 9.2   Smokeless tobacco: Never   Tobacco comments:    social and weekends alcohol  Substance and Sexual Activity   Alcohol use: Yes    Comment: Wine on weekends   Drug use: No   Sexual activity: Not Currently    Partners: Male    Birth control/protection: Post-menopausal  Other Topics Concern   Not on file  Social History Narrative   Regular exercise: walk 2 times a week   Caffeine use: 2 cups of coffee daily         Social Determinants of Health   Financial Resource Strain: Low Risk    Difficulty of Paying Living Expenses: Not hard at all  Food Insecurity: Not on file  Transportation Needs: Not on file  Physical Activity: Insufficiently Active   Days of  Exercise per Week: 3 days   Minutes of Exercise per Session: 40 min  Stress: Not on file  Social Connections: Not on file  Intimate Partner Violence: Not on file    Outpatient Medications Prior to Visit  Medication Sig Dispense Refill   aspirin 81 MG tablet Take 81 mg by mouth daily.     Calcium Carb-Cholecalciferol (CALCIUM 500 + D PO) Take 1 tablet by mouth in the morning and at bedtime.     citalopram (CELEXA) 10 MG tablet Take 1 tablet (10 mg total) by mouth daily. 90 tablet 3   diltiazem (CARTIA XT) 300 MG 24 hr capsule Take 1 capsule (300 mg total) by mouth daily. 90 capsule 1   fish oil-omega-3 fatty acids 1000 MG capsule Take 2 g by mouth daily.      GLUCOSAMINE-CHONDROITIN PO Take 1 tablet by mouth daily. 1500mg  of glucosamine     lisinopril (ZESTRIL) 30 MG tablet Take 1 tablet (30 mg total) by mouth daily. 90 tablet 1   meloxicam (MOBIC) 15 MG tablet Take 1 tablet (15 mg total) by mouth daily. 90 tablet 1   Multiple Vitamins-Minerals (MULTIVITAMIN WITH MINERALS) tablet Take 2 tablets by mouth daily. Vitafusion gummie     Polyethyl Glycol-Propyl Glycol 0.4-0.3 % SOLN Apply 1 drop to eye daily.      pravastatin (PRAVACHOL) 40 MG tablet Take 1 tablet (40 mg total) by mouth daily. 90 tablet 1   triamterene-hydrochlorothiazide (DYAZIDE) 37.5-25 MG capsule Take 1 each (1 capsule total) by mouth daily. 90 capsule 1   amoxicillin-clavulanate (AUGMENTIN) 875-125 MG tablet Take 1 tablet by mouth every 12 (twelve) hours. (Patient not taking: Reported on 07/03/2021) 14 tablet 0   No facility-administered medications prior to visit.    Allergies  Allergen Reactions   Oxycodone Nausea And Vomiting    Review of Systems  Constitutional:  Negative for fever and malaise/fatigue.  HENT:  Negative for congestion.   Eyes:  Negative for blurred vision.  Respiratory:  Negative for shortness of breath.   Cardiovascular:  Negative for chest pain, palpitations and leg swelling.  Gastrointestinal:   Negative for abdominal pain, blood in stool and nausea.  Genitourinary:  Negative for dysuria and frequency.  Musculoskeletal:  Positive for falls (Last fall on 05/30/2021). Negative for myalgias (Rib pain).  Skin:  Negative for rash.  Neurological:  Negative for dizziness, loss of consciousness and headaches.  Endo/Heme/Allergies:  Negative for environmental allergies.  Psychiatric/Behavioral:  Negative for depression. The patient is not nervous/anxious.       Objective:    Physical Exam Vitals and nursing  note reviewed.  Constitutional:      General: She is not in acute distress.    Appearance: Normal appearance. She is not ill-appearing.  HENT:     Head: Normocephalic and atraumatic.     Right Ear: External ear normal.     Left Ear: External ear normal.  Eyes:     Extraocular Movements: Extraocular movements intact.     Pupils: Pupils are equal, round, and reactive to light.  Cardiovascular:     Rate and Rhythm: Normal rate and regular rhythm.     Heart sounds: Normal heart sounds. No murmur heard.   No gallop.  Pulmonary:     Effort: Pulmonary effort is normal. No respiratory distress.     Breath sounds: Normal breath sounds. No wheezing or rales.  Skin:    General: Skin is warm and dry.  Neurological:     Mental Status: She is alert and oriented to person, place, and time.  Psychiatric:        Behavior: Behavior normal.        Judgment: Judgment normal.    BP 130/88 (BP Location: Left Arm, Patient Position: Sitting, Cuff Size: Normal)   Pulse 68   Temp 98.3 F (36.8 C) (Oral)   Resp 18   Ht 4\' 10"  (1.473 m)   Wt 145 lb (65.8 kg)   SpO2 98%   BMI 30.31 kg/m  Wt Readings from Last 3 Encounters:  07/03/21 145 lb (65.8 kg)  05/30/21 149 lb (67.6 kg)  05/28/21 149 lb (67.6 kg)    Diabetic Foot Exam - Simple   No data filed    Lab Results  Component Value Date   WBC 18.8 (H) 05/28/2021   HGB 15.6 (H) 05/28/2021   HCT 44.7 05/28/2021   PLT 318 05/28/2021    GLUCOSE 88 07/03/2021   CHOL 173 07/03/2021   TRIG 119.0 07/03/2021   HDL 56.90 07/03/2021   LDLDIRECT 121.0 03/30/2015   LDLCALC 92 07/03/2021   ALT 10 07/03/2021   AST 14 07/03/2021   NA 142 07/03/2021   K 4.1 07/03/2021   CL 105 07/03/2021   CREATININE 0.94 07/03/2021   BUN 28 (H) 07/03/2021   CO2 27 07/03/2021   TSH 1.88 09/27/2014   INR 0.98 03/17/2012   HGBA1C 5.6 06/08/2012    Lab Results  Component Value Date   TSH 1.88 09/27/2014   Lab Results  Component Value Date   WBC 18.8 (H) 05/28/2021   HGB 15.6 (H) 05/28/2021   HCT 44.7 05/28/2021   MCV 88.2 05/28/2021   PLT 318 05/28/2021   Lab Results  Component Value Date   NA 142 07/03/2021   K 4.1 07/03/2021   CO2 27 07/03/2021   GLUCOSE 88 07/03/2021   BUN 28 (H) 07/03/2021   CREATININE 0.94 07/03/2021   BILITOT 0.7 07/03/2021   ALKPHOS 61 07/03/2021   AST 14 07/03/2021   ALT 10 07/03/2021   PROT 7.2 07/03/2021   ALBUMIN 4.1 07/03/2021   CALCIUM 9.6 07/03/2021   ANIONGAP 9 05/28/2021   GFR 56.65 (L) 07/03/2021   Lab Results  Component Value Date   CHOL 173 07/03/2021   Lab Results  Component Value Date   HDL 56.90 07/03/2021   Lab Results  Component Value Date   LDLCALC 92 07/03/2021   Lab Results  Component Value Date   TRIG 119.0 07/03/2021   Lab Results  Component Value Date   CHOLHDL 3 07/03/2021   Lab Results  Component Value Date   HGBA1C 5.6 06/08/2012       Assessment & Plan:   Problem List Items Addressed This Visit       Unprioritized   HTN (hypertension) - Primary   Relevant Orders   Comprehensive metabolic panel (Completed)   Lipid panel (Completed)   Hyperlipidemia    Encourage heart healthy diet such as MIND or DASH diet, increase exercise, avoid trans fats, simple carbohydrates and processed foods, consider a krill or fish or flaxseed oil cap daily.       Relevant Orders   Comprehensive metabolic panel (Completed)   Lipid panel (Completed)   Other  Visit Diagnoses     Need for influenza vaccination       Relevant Orders   Flu Vaccine QUAD High Dose(Fluad) (Completed)        No orders of the defined types were placed in this encounter.   I, Ann Held, DO, personally preformed the services described in this documentation.  All medical record entries made by the scribe were at my direction and in my presence.  I have reviewed the chart and discharge instructions (if applicable) and agree that the record reflects my personal performance and is accurate and complete. 07/03/2021   I,Shehryar Baig,acting as a scribe for Ann Held, DO.,have documented all relevant documentation on the behalf of Ann Held, DO,as directed by  Ann Held, DO while in the presence of Ann Held, DO.   Ann Held, DO

## 2021-07-03 NOTE — Patient Instructions (Signed)

## 2021-07-06 NOTE — Assessment & Plan Note (Signed)
Encourage heart healthy diet such as MIND or DASH diet, increase exercise, avoid trans fats, simple carbohydrates and processed foods, consider a krill or fish or flaxseed oil cap daily.  °

## 2021-07-06 NOTE — Assessment & Plan Note (Signed)
Well controlled, no changes to meds. Encouraged heart healthy diet such as the DASH diet and exercise as tolerated.  °

## 2021-07-12 DIAGNOSIS — M5416 Radiculopathy, lumbar region: Secondary | ICD-10-CM | POA: Diagnosis not present

## 2021-07-12 DIAGNOSIS — M48061 Spinal stenosis, lumbar region without neurogenic claudication: Secondary | ICD-10-CM | POA: Diagnosis not present

## 2021-07-13 ENCOUNTER — Other Ambulatory Visit: Payer: Self-pay | Admitting: Family Medicine

## 2021-07-13 DIAGNOSIS — Z1231 Encounter for screening mammogram for malignant neoplasm of breast: Secondary | ICD-10-CM

## 2021-07-16 ENCOUNTER — Other Ambulatory Visit: Payer: Self-pay | Admitting: Family Medicine

## 2021-07-16 DIAGNOSIS — I10 Essential (primary) hypertension: Secondary | ICD-10-CM

## 2021-07-16 DIAGNOSIS — E782 Mixed hyperlipidemia: Secondary | ICD-10-CM

## 2021-08-03 ENCOUNTER — Encounter: Payer: Self-pay | Admitting: Family Medicine

## 2021-08-03 DIAGNOSIS — H903 Sensorineural hearing loss, bilateral: Secondary | ICD-10-CM | POA: Diagnosis not present

## 2021-08-09 DIAGNOSIS — M48061 Spinal stenosis, lumbar region without neurogenic claudication: Secondary | ICD-10-CM | POA: Diagnosis not present

## 2021-08-09 DIAGNOSIS — M5416 Radiculopathy, lumbar region: Secondary | ICD-10-CM | POA: Diagnosis not present

## 2021-08-13 DIAGNOSIS — M5416 Radiculopathy, lumbar region: Secondary | ICD-10-CM | POA: Diagnosis not present

## 2021-08-15 ENCOUNTER — Telehealth: Payer: Self-pay | Admitting: Pharmacist

## 2021-08-15 NOTE — Chronic Care Management (AMB) (Signed)
    Chronic Care Management Pharmacy Assistant   Name: Kimberly Edwards  MRN: [id number] DOB: [birth year]  Reason for Encounter: General  Recent office visits:  07/03/21-Kimberly [place] Caster, DO (PCP) Follow up visit for hypertension and hyperlipidemia. Labs ordered. Flu vaccine given. Follow up in 6 months. 06/07/21- Kimberly C. Copland, MD (Video visit) Seen for COIVD-19. Start on [employer].  Recent consult visits:  None noted  Hospital visits:  Admitted to the hospital on08/17/22 due to a fall. Discharge date was 05/30/21. Discharged from [place]?Medications Started at [facility] Discharge:?? -started  Amoxicillin 875-125 mg 1 tab every 12 hours. To prevent infection on laceration on lower left hip.  Medication Changes at Hospital Discharge: -Changed None noted  Medications Discontinued at Hospital Discharge: -Stopped none noted  Medications that remain the same after Hospital Discharge:??  -All other medications will remain the same.     Admitted to the hospital [place] due to Abdominal pain. Discharge date was 05/28/21. Discharged from [place]?Medications Started at [facility] Discharge:?? -started None noted  Medication Changes at Hospital Discharge: -Changed None noted  Medications Discontinued at Hospital Discharge: -Stopped None noted  Medications that remain the same after Hospital Discharge:??  -All other medications will remain the same.  Medications: Outpatient Encounter Medications as of 08/15/2021  Medication Sig   aspirin 81 MG tablet Take 81 mg by mouth daily.   Calcium Carb-Cholecalciferol (CALCIUM 500 + D PO) Take 1 tablet by mouth in the morning and at bedtime.   citalopram (CELEXA) 10 MG tablet Take 1 tablet (10 mg total) by mouth daily.   diltiazem (CARDIZEM CD) 300 MG 24 hr capsule TAKE ONE CAPSULE BY MOUTH BEFORE BREAKFAST   fish oil-omega-3 fatty acids 1000 MG capsule Take 2 g  by mouth daily.    GLUCOSAMINE-CHONDROITIN PO Take 1 tablet by mouth daily. 1500mg  of glucosamine   lisinopril (ZESTRIL) 30 MG tablet TAKE ONE TABLET BY MOUTH BEFORE BREAKFAST   meloxicam (MOBIC) 15 MG tablet Take 1 tablet (15 mg total) by mouth daily.   Multiple Vitamins-Minerals (MULTIVITAMIN WITH MINERALS) tablet Take 2 tablets by mouth daily. Vitafusion gummie   Polyethyl Glycol-Propyl Glycol 0.4-0.3 % SOLN Apply 1 drop to eye daily.    pravastatin (PRAVACHOL) 40 MG tablet TAKE ONE TABLET BY MOUTH BEFORE BREAKFAST   triamterene-hydrochlorothiazide (DYAZIDE) 37.5-25 MG capsule TAKE ONE CAPSULE BY MOUTH BEFORE BREAKFAST   No facility-administered encounter medications on file as of 08/15/2021.   Have you had any problems recently with your health? Patient states she has no problems with her health at this time.  Have you had any problems with your pharmacy? Patient states she has no problems with her pharmacy.  What issues or side effects are you having with your medications? Patient states she has no issues of side effects with any of her medications.  What would you like me to pass along to [employer] ,CPP for them to help you with?  Patient states there is nothing at this time.  What can we do to take care of you better? Patient states there is nothing at this time.  Star Rating Drugs: Lisinopril 30 mg Last filled:04/27/21 90 DS Pravastatin sodium mg Last filled:   [name], [place]

## 2021-08-16 ENCOUNTER — Ambulatory Visit
Admission: RE | Admit: 2021-08-16 | Discharge: 2021-08-16 | Disposition: A | Discharge status: Home / Self Care | Payer: PPO | Source: Ambulatory Visit | Attending: Family Medicine | Admitting: Family Medicine

## 2021-08-16 DIAGNOSIS — Z1231 Encounter for screening mammogram for malignant neoplasm of breast: Secondary | ICD-10-CM

## 2021-08-24 NOTE — Telephone Encounter (Signed)
Reviewed Chronic Care Management phone encounter by CMA.  Reviewed refill history in Epic for pravastatin since not noted in phone encounter:  Pravastatin Sodium  Dispensed Days Supply Quantity Provider Pharmacy  pravastatin 40 mg tablet 07/26/2021 90 90 each Lowne Chase, [place]...  pravastatin 40 mg tablet 04/27/2021 90 90 each Lowne Chase, [place]...  pravastatin 40 mg tablet 02/01/2021 90 90 each Lowne Chase, [place]...  pravastatin 40 mg tablet 11/01/2020 90 90 each Lowne Chase, [place]...   Also phone encounter shows last lisinopril fill > 90 days ago. Reviewed refill report in Epic Lisinopril  Dispensed Days Supply Quantity Provider Pharmacy  lisinopril 30 mg tablet 07/26/2021 90 90 each Lowne Chase, [place]...  lisinopril 30 mg tablet 04/27/2021 90 90 each Lowne Chase, [place]...  lisinopril 30 mg tablet 02/01/2021 90 90 each Lowne Chase, [place]...  lisinopril 30 mg tablet 11/01/2020 90 90 each Lowne Chase, [place]...  Appears patient has filled prescriptions on time.

## 2021-08-28 DIAGNOSIS — H04123 Dry eye syndrome of bilateral lacrimal glands: Secondary | ICD-10-CM | POA: Diagnosis not present

## 2021-08-28 DIAGNOSIS — H524 Presbyopia: Secondary | ICD-10-CM | POA: Diagnosis not present

## 2021-08-28 DIAGNOSIS — Z961 Presence of intraocular lens: Secondary | ICD-10-CM | POA: Diagnosis not present

## 2021-09-10 DIAGNOSIS — M5416 Radiculopathy, lumbar region: Secondary | ICD-10-CM | POA: Diagnosis not present

## 2021-09-10 DIAGNOSIS — M48061 Spinal stenosis, lumbar region without neurogenic claudication: Secondary | ICD-10-CM | POA: Diagnosis not present

## 2021-09-17 NOTE — Progress Notes (Signed)
Subjective:   Kimberly Edwards is a 80 y.o. female who presents for Medicare Annual (Subsequent) preventive examination.  I connected with [name] today by telephone and verified that I am speaking with the correct person using two identifiers. Location patient: home Location provider: work Persons participating in the virtual visit: patient, Marine scientist.    I discussed the limitations, risks, security and privacy concerns of performing an evaluation and management service by telephone and the availability of in person appointments. I also discussed with the patient that there may be a patient responsible charge related to this service. The patient expressed understanding and verbally consented to this telephonic visit.    Interactive audio and video telecommunications were attempted between this provider and patient, however failed, due to patient having technical difficulties OR patient did not have access to video capability.  We continued and completed visit with audio only.  Some vital signs may be absent or patient reported.   Time Spent with patient on telephone encounter: 20 minutes   Review of Systems     Cardiac Risk Factors include: advanced age (>106men, >64 women);dyslipidemia;hypertension;sedentary lifestyle;obesity (BMI >30kg/m2)     Objective:    Today's Vitals   09/18/21 1021  Weight: 145 lb (65.8 kg)  Height: 4\' 10"  (1.473 m)   Body mass index is 30.31 kg/m.  Advanced Directives 09/18/2021 05/30/2021 05/28/2021 06/17/2017 10/19/2015 03/11/2012  Does Patient Have a Medical Advance Directive? Yes Yes Yes Yes Yes Patient has advance directive, copy not in chart  Type of Advance Directive [place];Living will Living will;Healthcare Power of [place];Living will [place];Living will Living will;Healthcare Power of Attorney  Does patient want to make changes to medical advance directive? - No - Patient declined -  No - Patient declined No - Patient declined -  Copy of [place] in Chart? No - copy requested No - copy requested - No - copy requested No - copy requested -    Current Medications (verified) Outpatient Encounter Medications as of 09/18/2021  Medication Sig   aspirin 81 MG tablet Take 81 mg by mouth daily.   Calcium Carb-Cholecalciferol (CALCIUM 500 + D PO) Take 1 tablet by mouth in the morning and at bedtime.   citalopram (CELEXA) 10 MG tablet Take 1 tablet (10 mg total) by mouth daily.   diltiazem (CARDIZEM CD) 300 MG 24 hr capsule TAKE ONE CAPSULE BY MOUTH BEFORE BREAKFAST   fish oil-omega-3 fatty acids 1000 MG capsule Take 2 g by mouth daily.    GLUCOSAMINE-CHONDROITIN PO Take 1 tablet by mouth daily. 1500mg  of glucosamine   lisinopril (ZESTRIL) 30 MG tablet TAKE ONE TABLET BY MOUTH BEFORE BREAKFAST   meloxicam (MOBIC) 15 MG tablet Take 1 tablet (15 mg total) by mouth daily.   Multiple Vitamins-Minerals (MULTIVITAMIN WITH MINERALS) tablet Take 2 tablets by mouth daily. Vitafusion gummie   Polyethyl Glycol-Propyl Glycol 0.4-0.3 % SOLN Apply 1 drop to eye daily.    pravastatin (PRAVACHOL) 40 MG tablet TAKE ONE TABLET BY MOUTH BEFORE BREAKFAST   triamterene-hydrochlorothiazide (DYAZIDE) 37.5-25 MG capsule TAKE ONE CAPSULE BY MOUTH BEFORE BREAKFAST   No facility-administered encounter medications on file as of 09/18/2021.    Allergies (verified) Oxycodone   History: Past Medical History:  Diagnosis Date   Arthritis    Depression    Heart murmur    Hyperlipidemia    Hypertension    Knee joint replacement status, left 2003   PONV (postoperative  nausea and vomiting)    Right knee DJD    Past Surgical History:  Procedure Laterality Date   BREAST BIOPSY  2000   marker put in   [place]     left knee   TONSILLECTOMY     TOTAL KNEE ARTHROPLASTY  03/23/2012   Procedure: TOTAL KNEE ARTHROPLASTY;   Surgeon: Lorn Junes, MD;  Location: [facility];  Service: Orthopedics;  Laterality: Right;  right total knee arthroplasty   Family History  Problem Relation Age of Onset   Hypertension Father    Breast cancer Sister    Social History   Socioeconomic History   Marital status: Widowed    Spouse name: Not on file   Number of children: Not on file   Years of education: Not on file   Highest education level: Not on file  Occupational History   Occupation: retired Theme park manager  Tobacco Use   Smoking status: Former    Packs/day: 0.10    Years: 50.00    Pack years: 5.00    Types: Cigarettes    Quit date: 03/23/2012    Years since quitting: 9.4   Smokeless tobacco: Never   Tobacco comments:    social and weekends alcohol  Substance and Sexual Activity   Alcohol use: Yes    Comment: Wine on weekends   Drug use: No   Sexual activity: Not Currently    Partners: Male    Birth control/protection: Post-menopausal  Other Topics Concern   Not on file  Social History Narrative   Regular exercise: walk 2 times a week   Caffeine use: 2 cups of coffee daily         Social Determinants of Health   Financial Resource Strain: Low Risk    Difficulty of Paying Living Expenses: Not hard at all  Food Insecurity: No Food Insecurity   Worried About Charity fundraiser in the Last Year: Never true   [place] in the Last Year: Never true  Transportation Needs: No Transportation Needs   Lack of Transportation (Medical): No   Lack of Transportation (Non-Medical): No  Physical Activity: Insufficiently Active   Days of Exercise per Week: 3 days   Minutes of Exercise per Session: 40 min  Stress: No Stress Concern Present   Feeling of Stress : Not at all  Social Connections: Moderately Integrated   Frequency of Communication with Friends and Family: More than three times a week   Frequency of Social Gatherings with Friends and Family: More than three times a week   Attends Religious  Services: More than 4 times per year   Active Member of [employer] or Organizations: Yes   Attends Archivist Meetings: More than 4 times per year   Marital Status: Widowed    Tobacco Counseling Counseling given: Not Answered Tobacco comments: social and weekends alcohol   Clinical Intake:  Pre-visit preparation completed: Yes  Pain : No/denies pain     BMI - recorded: 30.31 Nutritional Status: BMI > 30  Obese Nutritional Risks: None Diabetes: No  How often do you need to have someone help you when you read instructions, pamphlets, or other written materials from your doctor or pharmacy?: 1 - Never  Diabetic?No  Interpreter Needed?: No  Information entered by :: Caroleen Hamman LPN   Activities of Daily Living In your present state of health, do you have any difficulty performing the  following activities: 09/18/2021 04/02/2021  Hearing? N N  Vision? N N  Difficulty concentrating or making decisions? N N  Walking or climbing stairs? N N  Dressing or bathing? N N  Doing errands, shopping? N N  Preparing Food and eating ? N -  Using the Toilet? N -  In the past six months, have you accidently leaked urine? N -  Do you have problems with loss of bowel control? N -  Managing your Medications? N -  Managing your Finances? N -  Housekeeping or managing your Housekeeping? N -  Some recent data might be hidden    Patient Care Team: Carollee Herter, Alferd Apa, DO as PCP - General (Family Medicine) Jari Pigg, MD as Consulting Physician (Dermatology) Elsie Saas, MD as Consulting Physician (Orthopedic Surgery) Calvert Cantor, MD as Consulting Physician (Ophthalmology) Garrel Ridgel, DPM as Consulting Physician (Podiatry) Cherre Robins, RPH-CPP (Pharmacist)  Indicate any recent Medical Services you may have received from other than [facility] providers in the past year (date may be approximate).     Assessment:   This is a routine wellness examination for  [place]ia.  Hearing/Vision screen Hearing Screening - Comments:: Bilateral hearing aids Vision Screening - Comments:: Last eye exam-08/2021-Dr. Bing Plume  Dietary issues and exercise activities discussed: Current Exercise Habits: The patient does not participate in regular exercise at present, Exercise limited by: None identified   Goals Addressed             This Visit's Progress    Patient Stated       Drink more water       Depression Screen PHQ 2/9 Scores 09/18/2021 04/02/2021 06/23/2018 06/17/2017 10/19/2015 10/19/2015 03/30/2015  PHQ - 2 Score 0 0 0 0 0 0 0    Fall Risk Fall Risk  09/18/2021 04/02/2021 06/23/2020 06/17/2017 10/19/2015  Falls in the past year? 1 0 0 No No  Number falls in past yr: 0 0 0 - -  Injury with Fall? 1 0 0 - -  Risk Factor Category  - - - - -  Risk for fall due to : History of fall(s) - - - -  Follow up Falls prevention discussed Falls evaluation completed Falls evaluation completed - -    FALL RISK PREVENTION PERTAINING TO THE HOME:  Any stairs in or around the home? No  Home free of loose throw rugs in walkways, pet beds, electrical cords, etc? Yes  Adequate lighting in your home to reduce risk of falls? Yes   ASSISTIVE DEVICES UTILIZED TO PREVENT FALLS:  Life alert? No  Use of a cane, walker or w/c? Yes  Grab bars in the bathroom? Yes  Shower chair or bench in shower? Yes  Elevated toilet seat or a handicapped toilet? Yes   TIMED UP AND GO:  Was the test performed? No . Phone visit   Cognitive Function:Normal cognitive status assessed by this Nurse Health Advisor. No abnormalities found.          Immunizations Immunization History  Administered Date(s) Administered   Fluad Quad(high Dose 65+) 06/22/2019, 07/03/2021   Influenza, High Dose Seasonal PF 06/22/2014, 06/27/2015, 06/18/2016, 06/17/2017, 06/23/2018   Influenza,inj,Quad PF,6+ Mos 07/01/2013   Influenza-Unspecified 06/14/2014   PFIZER Comirnaty(Gray Top)Covid-19 Tri-Sucrose Vaccine  01/25/2021   PFIZER(Purple Top)SARS-COV-2 Vaccination 11/06/2019, 11/27/2019, 06/23/2020   Pfizer Covid-19 Vaccine Bivalent Booster 24yrs & up 08/28/2021   Pneumococcal Conjugate-13 09/29/2014   Pneumococcal Polysaccharide-23 10/19/2015   Tdap 05/30/2021    TDAP status: Up to date  Flu Vaccine status: Up to date  Pneumococcal vaccine status: Up to date  Covid-19 vaccine status: Completed vaccines  Qualifies for Shingles Vaccine? Yes   Zostavax completed No   Shingrix Completed?: No.    Education has been provided regarding the importance of this vaccine. Patient has been advised to call insurance company to determine out of pocket expense if they have not yet received this vaccine. Advised may also receive vaccine at local pharmacy or Health Dept. Verbalized acceptance and understanding.  Screening Tests Health Maintenance  Topic Date Due   Zoster Vaccines- Shingrix (1 of 2) Never done   MAMMOGRAM  08/16/2022   TETANUS/TDAP  05/31/2031   Pneumonia Vaccine 63+ Years old  Completed   INFLUENZA VACCINE  Completed   DEXA SCAN  Completed   COVID-19 Vaccine  Completed   HPV VACCINES  Aged Out    Health Maintenance  Health Maintenance Due  Topic Date Due   Zoster Vaccines- Shingrix (1 of 2) Never done    Colorectal cancer screening: No longer required.   Mammogram status: Completed Bilateral-08/16/2021. Repeat every year  Bone Density status: Completed 09/25/2020. Results reflect: Bone density results: OSTEOPENIA. Repeat every 2 years.  Lung Cancer Screening: (Low Dose CT Chest recommended if Age 4-80 years, 30 pack-year currently smoking OR have quit w/in 15years.) does not qualify.     Additional Screening:  Hepatitis C Screening: does not qualify  Vision Screening: Recommended annual ophthalmology exams for early detection of glaucoma and other disorders of the eye. Is the patient up to date with their annual eye exam?  Yes  Who is the provider or what is the name  of the office in which the patient attends annual eye exams? Dr. Bing Plume   Dental Screening: Recommended annual dental exams for proper oral hygiene  Community Resource Referral / Chronic Care Management: CRR required this visit?  No   CCM required this visit?  No      Plan:     I have personally reviewed and noted the following in the patient's chart:   Medical and social history Use of alcohol, tobacco or illicit drugs  Current medications and supplements including opioid prescriptions.  Functional ability and status Nutritional status Physical activity Advanced directives List of other physicians Hospitalizations, surgeries, and ER visits in previous 12 months Vitals Screenings to include cognitive, depression, and falls Referrals and appointments  In addition, I have reviewed and discussed with patient certain preventive protocols, quality metrics, and best practice recommendations. A written personalized care plan for preventive services as well as general preventive health recommendations were provided to patient.   Due to this being a telephonic visit, the after visit summary with patients personalized plan was offered to patient via mail or my-chart. Patient would like to access on my-chart.   Marta Antu, LPN   [id number]  Nurse Health Advisor  Nurse Notes: None

## 2021-09-18 ENCOUNTER — Ambulatory Visit (INDEPENDENT_AMBULATORY_CARE_PROVIDER_SITE_OTHER): Payer: PPO

## 2021-09-18 VITALS — Ht <= 58 in | Wt 145.0 lb

## 2021-09-18 DIAGNOSIS — Z0000 Encounter for general adult medical examination without abnormal findings: Secondary | ICD-10-CM | POA: Diagnosis not present

## 2021-09-18 NOTE — Patient Instructions (Addendum)
Kimberly Edwards , Thank you for taking time to complete your Medicare Wellness Visit. I appreciate your ongoing commitment to your health goals. Please review the following plan we discussed and let me know if I can assist you in the future.   Screening recommendations/referrals: Colonoscopy: No longer required Mammogram: Completed 08/16/2021-Due 08/16/2022 Bone Density: Completed 09/25/2020-Due 09/25/2022 Recommended yearly ophthalmology/optometry visit for glaucoma screening and checkup Recommended yearly dental visit for hygiene and checkup  Vaccinations: Influenza vaccine: Up to date Pneumococcal vaccine: Up to date Tdap vaccine: Up to date Shingles vaccine: Discuss with pharmacy   Covid-19:Up to date  Advanced directives: Please bring a copy of Living Will and/or Healthcare Power of Attorney for your chart.   Conditions/risks identified: See problem list  Next appointment: Follow up in one year for your annual wellness visit 09/24/2022 @ 10:20(phone visit)   Preventive Care 65 Years and Older, Female Preventive care refers to lifestyle choices and visits with your health care provider that can promote health and wellness. What does preventive care include? A yearly physical exam. This is also called an annual well check. Dental exams once or twice a year. Routine eye exams. Ask your health care provider how often you should have your eyes checked. Personal lifestyle choices, including: Daily care of your teeth and gums. Regular physical activity. Eating a healthy diet. Avoiding tobacco and drug use. Limiting alcohol use. Practicing safe sex. Taking low-dose aspirin every day. Taking vitamin and mineral supplements as recommended by your health care provider. What happens during an annual well check? The services and screenings done by your health care provider during your annual well check will depend on your age, overall health, lifestyle risk factors, and family history of  disease. Counseling  Your health care provider may ask you questions about your: Alcohol use. Tobacco use. Drug use. Emotional well-being. Home and relationship well-being. Sexual activity. Eating habits. History of falls. Memory and ability to understand (cognition). Work and work Statistician. Reproductive health. Screening  You may have the following tests or measurements: Height, weight, and BMI. Blood pressure. Lipid and cholesterol levels. These may be checked every 5 years, or more frequently if you are over 81 years old. Skin check. Lung cancer screening. You may have this screening every year starting at age 59 if you have a 30-pack-year history of smoking and currently smoke or have quit within the past 15 years. Fecal occult blood test (FOBT) of the stool. You may have this test every year starting at age 37. Flexible sigmoidoscopy or colonoscopy. You may have a sigmoidoscopy every 5 years or a colonoscopy every 10 years starting at age 43. Hepatitis C blood test. Hepatitis B blood test. Sexually transmitted disease (STD) testing. Diabetes screening. This is done by checking your blood sugar (glucose) after you have not eaten for a while (fasting). You may have this done every 1-3 years. Bone density scan. This is done to screen for osteoporosis. You may have this done starting at age 46. Mammogram. This may be done every 1-2 years. Talk to your health care provider about how often you should have regular mammograms. Talk with your health care provider about your test results, treatment options, and if necessary, the need for more tests. Vaccines  Your health care provider may recommend certain vaccines, such as: Influenza vaccine. This is recommended every year. Tetanus, diphtheria, and acellular pertussis (Tdap, Td) vaccine. You may need a Td booster every 10 years. Zoster vaccine. You may need this after age 22.  Pneumococcal 13-valent conjugate (PCV13) vaccine. One  dose is recommended after age 44. Pneumococcal polysaccharide (PPSV23) vaccine. One dose is recommended after age 43. Talk to your health care provider about which screenings and vaccines you need and how often you need them. This information is not intended to replace advice given to you by your health care provider. Make sure you discuss any questions you have with your health care provider. Document Released: 10/27/2015 Document Revised: 06/19/2016 Document Reviewed: 08/01/2015 Elsevier Interactive Patient Education  2017 [place] Prevention in the Home Falls can cause injuries. They can happen to people of all ages. There are many things you can do to make your home safe and to help prevent falls. What can I do on the outside of my home? Regularly fix the edges of walkways and driveways and fix any cracks. Remove anything that might make you trip as you walk through a door, such as a raised step or threshold. Trim any bushes or trees on the path to your home. Use bright outdoor lighting. Clear any walking paths of anything that might make someone trip, such as rocks or tools. Regularly check to see if handrails are loose or broken. Make sure that both sides of any steps have handrails. Any raised decks and porches should have guardrails on the edges. Have any leaves, snow, or ice cleared regularly. Use sand or salt on walking paths during winter. Clean up any spills in your garage right away. This includes oil or grease spills. What can I do in the bathroom? Use night lights. Install grab bars by the toilet and in the tub and shower. Do not use towel bars as grab bars. Use non-skid mats or decals in the tub or shower. If you need to sit down in the shower, use a plastic, non-slip stool. Keep the floor dry. Clean up any water that spills on the floor as soon as it happens. Remove soap buildup in the tub or shower regularly. Attach bath mats securely with double-sided  non-slip rug tape. Do not have throw rugs and other things on the floor that can make you trip. What can I do in the bedroom? Use night lights. Make sure that you have a light by your bed that is easy to reach. Do not use any sheets or blankets that are too big for your bed. They should not hang down onto the floor. Have a firm chair that has side arms. You can use this for support while you get dressed. Do not have throw rugs and other things on the floor that can make you trip. What can I do in the kitchen? Clean up any spills right away. Avoid walking on wet floors. Keep items that you use a lot in easy-to-reach places. If you need to reach something above you, use a strong step stool that has a grab bar. Keep electrical cords out of the way. Do not use floor polish or wax that makes floors slippery. If you must use wax, use non-skid floor wax. Do not have throw rugs and other things on the floor that can make you trip. What can I do with my stairs? Do not leave any items on the stairs. Make sure that there are handrails on both sides of the stairs and use them. Fix handrails that are broken or loose. Make sure that handrails are as long as the stairways. Check any carpeting to make sure that it is firmly attached to the stairs. Fix any  carpet that is loose or worn. Avoid having throw rugs at the top or bottom of the stairs. If you do have throw rugs, attach them to the floor with carpet tape. Make sure that you have a light switch at the top of the stairs and the bottom of the stairs. If you do not have them, ask someone to add them for you. What else can I do to help prevent falls? Wear shoes that: Do not have high heels. Have rubber bottoms. Are comfortable and fit you well. Are closed at the toe. Do not wear sandals. If you use a stepladder: Make sure that it is fully opened. Do not climb a closed stepladder. Make sure that both sides of the stepladder are locked into place. Ask  someone to hold it for you, if possible. Clearly mark and make sure that you can see: Any grab bars or handrails. First and last steps. Where the edge of each step is. Use tools that help you move around (mobility aids) if they are needed. These include: Canes. Walkers. Scooters. Crutches. Turn on the lights when you go into a dark area. Replace any light bulbs as soon as they burn out. Set up your furniture so you have a clear path. Avoid moving your furniture around. If any of your floors are uneven, fix them. If there are any pets around you, be aware of where they are. Review your medicines with your doctor. Some medicines can make you feel dizzy. This can increase your chance of falling. Ask your doctor what other things that you can do to help prevent falls. This information is not intended to replace advice given to you by your health care provider. Make sure you discuss any questions you have with your health care provider. Document Released: 07/27/2009 Document Revised: 03/07/2016 Document Reviewed: 11/04/2014 Elsevier Interactive Patient Education  2017 [employer].

## 2021-11-01 DIAGNOSIS — M19012 Primary osteoarthritis, left shoulder: Secondary | ICD-10-CM | POA: Diagnosis not present

## 2021-11-08 ENCOUNTER — Other Ambulatory Visit: Payer: Self-pay | Admitting: Orthopaedic Surgery

## 2021-11-08 DIAGNOSIS — M19012 Primary osteoarthritis, left shoulder: Secondary | ICD-10-CM | POA: Diagnosis not present

## 2021-11-08 DIAGNOSIS — M25511 Pain in right shoulder: Secondary | ICD-10-CM

## 2021-11-23 ENCOUNTER — Ambulatory Visit (INDEPENDENT_AMBULATORY_CARE_PROVIDER_SITE_OTHER): Payer: PPO | Admitting: Pharmacist

## 2021-11-23 DIAGNOSIS — M8580 Other specified disorders of bone density and structure, unspecified site: Secondary | ICD-10-CM

## 2021-11-23 DIAGNOSIS — E782 Mixed hyperlipidemia: Secondary | ICD-10-CM

## 2021-11-23 DIAGNOSIS — I10 Essential (primary) hypertension: Secondary | ICD-10-CM

## 2021-11-23 NOTE — Chronic Care Management (AMB) (Signed)
Chronic Care Management Pharmacy Note  11/23/2021 Name:  Kimberly Edwards MRN:  [id number] DOB:  Jul 19, 1941  Subjective: Kimberly Edwards is an 81 y.o. year old female who is a primary patient of Ann Held, DO.  The CCM team was consulted for assistance with disease management and care coordination needs.    Engaged with patient by telephone for follow up visit in response to provider referral for pharmacy case management and/or care coordination services.   Consent to Services:  The patient was given information about Chronic Care Management services, agreed to services, and gave verbal consent prior to initiation of services.  Please see initial visit note for detailed documentation.   Patient Care Team: Carollee Herter, Alferd Apa, DO as PCP - General (Family Medicine) Jari Pigg, MD as Consulting Physician (Dermatology) Elsie Saas, MD as Consulting Physician (Orthopedic Surgery) Calvert Cantor, MD as Consulting Physician (Ophthalmology) Garrel Ridgel, DPM as Consulting Physician (Podiatry) Cherre Robins, RPH-CPP (Pharmacist)  Recent office visits: 07/03/21-[name], DO (PCP) Follow up visit for hypertension and hyperlipidemia. Labs ordered. Flu vaccine given. Follow up in 6 months. 06/07/21- [name], MD (Video visit) Seen for COIVD-19. Start on [facility] 04/02/21 Roma Schanz PCP- Seen for annual visit. No med chnages. Return in 2 months.  Recent consult visits: 09/10/2021 - Physical Med Ron Agee) Seen for radiculopathy and spinal stenosis. 08/28/2021 - Ophthalmology (Dr Bing Plume) seen for dry eye syndrome and eye exam 05/21/2021 - Ortho - received steroid injection for bursitis in hip.  Hospital visits: 05/30/21 - ED visit for fall 05/28/2021 - ED visit for abdominal pain   Objective:  Lab Results  Component Value Date   CREATININE 0.94 07/03/2021   CREATININE 1.13 (H) 05/28/2021   CREATININE 0.72 04/02/2021    Lab Results   Component Value Date   HGBA1C 5.6 06/08/2012   Last diabetic Eye exam: No results found for: HMDIABEYEEXA  Last diabetic Foot exam: No results found for: HMDIABFOOTEX      Component Value Date/Time   CHOL 173 07/03/2021 1014   TRIG 119.0 07/03/2021 1014   HDL 56.90 07/03/2021 1014   CHOLHDL 3 07/03/2021 1014   VLDL 23.8 07/03/2021 1014   LDLCALC 92 07/03/2021 1014   LDLCALC 92 06/23/2020 0926   LDLDIRECT 121.0 03/30/2015 1014    Hepatic Function Latest Ref Rng & Units 07/03/2021 05/28/2021 04/02/2021  Total Protein 6.0 - 8.3 g/dL 7.2 6.6 6.9  Albumin 3.5 - 5.2 g/dL 4.1 3.6 4.1  AST 0 - 37 U/L _0 ALT 0 - 35 U/L _1 Alk Phosphatase 39 - 117 U/L 61 58 63  Total Bilirubin 0.2 - 1.2 mg/dL 0.7 0.6 0.9  Bilirubin, Direct 0.0 - 0.3 mg/dL - - -    Lab Results  Component Value Date/Time   TSH 1.88 09/27/2014 09:51 AM   FREET4 0.93 09/27/2014 09:51 AM    CBC Latest Ref Rng & Units 05/28/2021 04/02/2021 12/22/2018  WBC 4.0 - 10.5 K/uL 18.8(H) 7.8 9.8  Hemoglobin 12.0 - 15.0 g/dL 15.6(H) 14.8 15.5(H)  Hematocrit 36.0 - 46.0 % 44.7 43.1 45.6  Platelets 150 - 400 K/uL 318 250.0 293.0    No results found for: VD25OH  Clinical ASCVD: No  The ASCVD Risk score (Arnett DK, et al., 2019) failed to calculate for the following reasons:   The 2019 ASCVD risk score is only valid for ages 72 to 63     Social History   Tobacco  Use  Smoking Status Former   Packs/day: 0.10   Years: 50.00   Pack years: 5.00   Types: Cigarettes   Quit date: 03/23/2012   Years since quitting: 9.6  Smokeless Tobacco Never  Tobacco Comments   social and weekends alcohol   BP Readings from Last 3 Encounters:  07/03/21 130/88  05/30/21 123/64  05/28/21 (!) 148/82   Pulse Readings from Last 3 Encounters:  07/03/21 68  05/30/21 (!) 50  05/28/21 62   Wt Readings from Last 3 Encounters:  09/18/21 145 lb (65.8 kg)  07/03/21 145 lb (65.8 kg)  05/30/21 149 lb (67.6 kg)    Assessment: Review  of patient past medical history, allergies, medications, health status, including review of consultants reports, laboratory and other test data, was performed as part of comprehensive evaluation and provision of chronic care management services.   SDOH:  (Social Determinants of Health) assessments and interventions performed:  SDOH Interventions    Flowsheet Row Most Recent Value  SDOH Interventions   Financial Strain Interventions Intervention Not Indicated  Physical Activity Interventions Intervention Not Indicated       CCM Care Plan  Allergies  Allergen Reactions   Oxycodone Nausea And Vomiting    Medications Reviewed Today     Reviewed by Cherre Robins, RPH-CPP (Pharmacist) on 11/23/21 at [place] List Status: <None>   Medication Order Taking? Sig Documenting Provider Last Dose Status Informant  aspirin 81 MG tablet [id number] Yes Take 81 mg by mouth daily. [provider] Taking Active Self  Calcium Carb-Cholecalciferol (CALCIUM 500 + D PO) [id number] Yes Take 1 tablet by mouth in the morning and at bedtime. [provider] Taking Active   citalopram (CELEXA) 10 MG tablet [id number] Yes Take 1 tablet (10 mg total) by mouth daily. Carollee Herter, Kendrick Fries R, DO Taking Active   diltiazem (CARDIZEM CD) 300 MG 24 hr capsule [id number] Yes TAKE ONE CAPSULE BY MOUTH BEFORE BREAKFAST Ann Held, DO Taking Active   fish oil-omega-3 fatty acids 1000 MG capsule [id number] Yes Take 2 g by mouth daily.  [provider] Taking Active   GLUCOSAMINE-CHONDROITIN PO [id number] Yes Take 1 tablet by mouth daily. 1535m of glucosamine [provider] Taking Active   lisinopril (ZESTRIL) 30 MG tablet [id number] TAKE ONE TABLET BY MOUTH BEFORE BREAKFAST LAnn Held DO Taking Active   meloxicam (MOBIC) 15 MG tablet [id number] Take 1 tablet (15 mg total) by mouth daily. LRoma SchanzR, DO Taking Active   Multiple Vitamins-Minerals (MULTIVITAMIN  WITH MINERALS) tablet [id number] Take 2 tablets by mouth daily. Vitafusion gummie [provider] Taking Active Self  Polyethyl Glycol-Propyl Glycol 0.4-0.3 % SOLN [id number] Apply 1 drop to eye daily.  [provider] Taking Active   pravastatin (PRAVACHOL) 40 MG tablet [id number] TAKE ONE TABLET BY MOUTH BEFORE BREAKFAST LCarollee Herter YAlferd Apa DO Taking Active   triamterene-hydrochlorothiazide (DYAZIDE) 37.5-25 MG capsule [id number] TAKE ONE CAPSULE BY MOUTH BEFORE BREAKFAST LAnn Held DO Taking Active             Patient Active Problem List   Diagnosis Date Noted   Preventative health care 04/02/2021   Bilateral hearing loss 06/23/2020   Estrogen deficiency 06/23/2020   Fatigue 12/22/2018   Generalized anxiety disorder 12/22/2018   Obesity (BMI 30-39.9) 07/01/2013   Dysuria 12/10/2012   Nausea and vomiting 03/25/2012   UTI (lower urinary tract infection) E Coli 03/25/2012  Hypertension    Arthritis    Knee joint replacement status, left    Right knee DJD    HTN (hypertension) 12/09/2011   Hyperlipidemia 12/09/2011   Osteoarthritis 05/31/2011    Immunization History  Administered Date(s) Administered   Fluad Quad(high Dose 65+) 06/22/2019, 07/03/2021   Influenza, High Dose Seasonal PF 06/22/2014, 06/27/2015, 06/18/2016, 06/17/2017, 06/23/2018   Influenza,inj,Quad PF,6+ Mos 07/01/2013   Influenza-Unspecified 06/14/2014   PFIZER Comirnaty(Gray Top)Covid-19 Tri-Sucrose Vaccine 01/25/2021   PFIZER(Purple Top)SARS-COV-2 Vaccination 11/06/2019, 11/27/2019, 06/23/2020   Pfizer Covid-19 Vaccine Bivalent Booster 26yr & up 08/28/2021   Pneumococcal Conjugate-13 09/29/2014   Pneumococcal Polysaccharide-23 10/19/2015   Tdap 05/30/2021    Conditions to be addressed/monitored: HTN, HLD, and hip pain / bursitis; GAD; osteopenia  Care Plan : General Pharmacy (Adult)  Updates made by ECherre Robins RPH-CPP since 11/23/2021 12:00 AM      Problem: HTN, Osteopenia, HLD, GAD   Priority: High  Onset Date: 11/21/2020     Long-Range Goal: Provide education, support and care coordination for medication therapy and chronic conditions   Start Date: 11/21/2020  Expected End Date: 05/21/2021  Recent Progress: On track  Priority: Medium  Note:   Current Barriers:  No therapy for bone health / osteopenia (has started calcium and vitamin D)   Pharmacist Clinical Goal(s):  Over the next 180 days, patient will adhere to prescribed medication regimen as evidenced by starting Calcium + Vitamin D OTC to prevent progression to osteoporosis. through collaboration with PharmD and provider. (Completed) Continue to monitor BP regularly at home.  Interventions: 1:1 collaboration with LCarollee Herter YAlferd Apa DO regarding development and update of comprehensive plan of care as evidenced by provider attestation and co-signature Inter-disciplinary care team collaboration (see longitudinal plan of care) Comprehensive medication review performed; medication list updated in electronic medical record  Hypertension (BP goal <140/90) controlled Current treatment: Diltiazem 3065mdaily Lisinopril 3065maily Triamterene-hctz 37.5-25mg daily Medications previously tried: none noted Current home readings:  SBP: 135-138 DBP: 70-78 Current exercise habits: Walks in pool 3 days per week for 45 minutes. She will increase time when ortho approves (had to cut back at request of physical therapies due to hip pain)  Denies hypotensive/hypertensive symptoms Interventions:  Reviewed blood pressure goals and home blood pressure records Educated on daily salt intake goal < 2300 mg (addressed at previous visit)  Discussed exercise goal of 150 minutes per week; patient to increase as able Counseled to monitor BP at home once per week, document, and provide log at future appointments Recommended to continue current medication  Hyperlipidemia: (LDL goal <  100) Controlled - last LDL was 92 Current treatment: Pravastatin 24m68mily Medications previously tried: none noted Current exercise habits: see above Interventions:  Reviewed LDL goal and last lipid panel Recommended to continue current medication  Depression/Anxiety (Goal: Minimize symptoms) -controlled -Current treatment: Citalopram 10mg23mdications previously tried/failed: none noted Interventions:  (addressed at previous visit) None today, addressed at previous visits Recommended to continue current medication   Osteopenia (Goal to maintain bone density, prevent fracture) Last DEXA Scan: 09/25/20 T-Score femoral neck: -2.1 T-Score forearm radius: -1.7 10-year probability of major osteoporotic fracture: 15.5% 10-year probability of hip fracture: 4.7% Patient is a candidate for pharmacologic treatment due to T-Score -1.0 to -2.5 and 10-year risk of hip fracture > 3% Patient declined pharmacotherapy for osteopenia 11/2020 Current treatment  Calcium 500mg 71m- taking 2 tablets daily Medications previously tried: none  Interventions:  Continue calcium + D to take  1 tablet twice a day for maximal absorption Recommend [phone] units of vitamin D daily.  Recommend 1200 mg of calcium daily from dietary and supplemental sources. Recommend repeat bone density around December 2023 Fall prevention discussed  Patient Goals/Self-Care Activities Over the next 180 days, patient will:  Take medications as prescribed check blood pressure weekly, document, and provide at future appointments Continue calcium + D to take 1 tablet twice a day Continue to walk in pool  Follow Up Plan: The care management team will reach out to the patient again over the next 180 days.        Medication Assistance: None required.  Patient affirms current coverage meets needs.  Patient's preferred pharmacy is:  [facility] - [place], [place] - [address] [place] [address] Phone: [phone] Fax: [phone]   Follow Up:  Patient agrees to Care Plan and Follow-up.  Plan: Telephone follow up appointment with care management team member scheduled for:  6 months  Cherre Robins, PharmD Clinical Pharmacist [facility]

## 2021-11-23 NOTE — Patient Instructions (Signed)
Kimberly Edwards It was a pleasure speaking with you today.  I have attached a summary of our visit today and information about your health goals.  See below  If you have any questions or concerns, please feel free to contact me either at the phone number below or with a MyChart message.   Keep up the good work!  Cherre Robins, PharmD Clinical Pharmacist [facility] [phone] (direct line)  [phone] (main office number)   CARE PLAN ENTRY (Updated 11/23/2021) Hypertension BP Readings from Last 3 Encounters:  07/03/21 130/88  05/30/21 123/64  05/28/21 (!) 148/82   Pharmacist Clinical Goal(s): Over the next 180 days, patient will work with PharmD and providers to maintain BP goal <140/90 Current regimen:  Diltiazem 300mg  daily Lisinopril 30mg  daily Triamterene-hydrochlorothiazide  37.5-25mg  daily Patient self care activities - Over the next 180 days, patient will: Check blood pressure once weekly, document, and provide at future appointments Ensure daily salt intake < 2300 mg/day Weekly exercise goal is 150 minutes per week; Continue to walk in pool and increase as able / as recommended by Dr Para March  Hyperlipidemia Lab Results  Component Value Date/Time   LDLCALC 92 07/03/2021 10:14 AM   LDLCALC 92 06/23/2020 09:26 AM   LDLDIRECT 121.0 03/30/2015 10:14 AM   Pharmacist Clinical Goal(s): Over the next 180 days, patient will work with PharmD and providers to maintain LDL goal < 100 Current regimen:  Pravastatin 40mg  daily Patient self care activities - Over the next 180 days, patient will: Maintain cholesterol medication regimen.   Osteopenia/Osteoporosis Screening Pharmacist Clinical Goal(s) Over the next 90 days, patient will work with PharmD and providers to maintain bone strength Current regimen:  Calcium 500mg  + D - taking 2 tablets daily Interventions: Recommend [phone] units of vitamin D daily.  Recommend 1200 mg of calcium  daily from dietary and supplemental sources. Recommend repeat bone density around December 2023 Patient self care activities - Over the next 90 days, patient will: Continue calcium + D supplement  - take 1 tablet TWICE a day Follow fall prevention tips  Health Maintenance  Pharmacist Clinical Goal(s) Over the next 90 days, patient will work with PharmD and providers to complete health maintenance screenings/vaccinations Interventions: Discussed getting Shingrix vaccine Patient self care activities - Over the next 90 days, patient will: Consider Shingrix vaccine - should be covered 100% at pharmacy for Medicare patients in 2023.   Medication management Pharmacist Clinical Goal(s): Over the next 180 days, patient will work with PharmD and providers to maintain optimal medication adherence Current pharmacy: [facility] Interventions Comprehensive medication review performed. Utilize pharmacy services for medication synchronization, packaging and delivery Patient self care activities - Over the next 180 days, patient will: Focus on medication adherence by filling and taking medications appropriately  Take medications as prescribed Report any questions or concerns to PharmD and/or provider(s)  Patient Goals/Self-Care Activities Over the next 180 days, patient will:  Take medications as prescribed check blood pressure weekly, document, and provide at future appointments Continue calcium + D to take 1 tablet twice a day Continue to walk in pool - goal is to get 150 minutes of physical activity each week.  Patient verbalizes understanding of instructions and care plan provided today and agrees to view in [place]. Active MyChart status confirmed with patient.

## 2021-11-30 ENCOUNTER — Ambulatory Visit
Admission: RE | Admit: 2021-11-30 | Discharge: 2021-11-30 | Disposition: A | Discharge status: Home / Self Care | Payer: PPO | Source: Ambulatory Visit | Attending: Orthopaedic Surgery | Admitting: Orthopaedic Surgery

## 2021-11-30 DIAGNOSIS — M25511 Pain in right shoulder: Secondary | ICD-10-CM

## 2021-11-30 DIAGNOSIS — M19012 Primary osteoarthritis, left shoulder: Secondary | ICD-10-CM | POA: Diagnosis not present

## 2021-11-30 DIAGNOSIS — M7989 Other specified soft tissue disorders: Secondary | ICD-10-CM | POA: Diagnosis not present

## 2021-12-06 DIAGNOSIS — M19012 Primary osteoarthritis, left shoulder: Secondary | ICD-10-CM | POA: Diagnosis not present

## 2021-12-09 ENCOUNTER — Encounter: Payer: Self-pay | Admitting: Family Medicine

## 2021-12-10 ENCOUNTER — Other Ambulatory Visit: Payer: Self-pay | Admitting: Family Medicine

## 2021-12-10 DIAGNOSIS — Z792 Long term (current) use of antibiotics: Secondary | ICD-10-CM

## 2021-12-10 MED ORDER — AMOXICILLIN 500 MG PO CAPS: ORAL_CAPSULE | ORAL | 4 refills | Status: DC

## 2021-12-11 DIAGNOSIS — E782 Mixed hyperlipidemia: Secondary | ICD-10-CM | POA: Diagnosis not present

## 2021-12-11 DIAGNOSIS — I10 Essential (primary) hypertension: Secondary | ICD-10-CM | POA: Diagnosis not present

## 2021-12-25 ENCOUNTER — Ambulatory Visit (INDEPENDENT_AMBULATORY_CARE_PROVIDER_SITE_OTHER): Payer: PPO | Admitting: Family Medicine

## 2021-12-25 ENCOUNTER — Encounter: Payer: Self-pay | Admitting: Family Medicine

## 2021-12-25 VITALS — BP 130/80 | HR 74 | Temp 98.2°F | Resp 16 | Ht <= 58 in | Wt 136.0 lb

## 2021-12-25 DIAGNOSIS — I10 Essential (primary) hypertension: Secondary | ICD-10-CM

## 2021-12-25 DIAGNOSIS — E785 Hyperlipidemia, unspecified: Secondary | ICD-10-CM | POA: Diagnosis not present

## 2021-12-25 DIAGNOSIS — E782 Mixed hyperlipidemia: Secondary | ICD-10-CM

## 2021-12-25 DIAGNOSIS — F411 Generalized anxiety disorder: Secondary | ICD-10-CM

## 2021-12-25 DIAGNOSIS — M1990 Unspecified osteoarthritis, unspecified site: Secondary | ICD-10-CM | POA: Diagnosis not present

## 2021-12-25 DIAGNOSIS — Z792 Long term (current) use of antibiotics: Secondary | ICD-10-CM | POA: Diagnosis not present

## 2021-12-25 LAB — COMPREHENSIVE METABOLIC PANEL
ALT: 9 U/L (ref 0–35)
AST: 15 U/L (ref 0–37)
Albumin: 4.3 g/dL (ref 3.5–5.2)
Alkaline Phosphatase: 68 U/L (ref 39–117)
BUN: 25 mg/dL — ABNORMAL HIGH (ref 6–23)
CO2: 28 mEq/L (ref 19–32)
Calcium: 10 mg/dL (ref 8.4–10.5)
Chloride: 100 mEq/L (ref 96–112)
Creatinine, Ser: 0.86 mg/dL (ref 0.40–1.20)
GFR: 62.81 mL/min (ref >60.00)
Glucose, Bld: 83 mg/dL (ref 70–99)
Potassium: 4.4 mEq/L (ref 3.5–5.1)
Sodium: 137 mEq/L (ref 135–145)
Total Bilirubin: 0.8 mg/dL (ref 0.2–1.2)
Total Protein: 6.8 g/dL (ref 6.0–8.3)

## 2021-12-25 LAB — LIPID PANEL
Cholesterol: 184 mg/dL (ref 0–200)
HDL: 69.2 mg/dL (ref >39.00)
LDL Cholesterol: 91 mg/dL (ref 0–99)
NonHDL: 114.57
Total CHOL/HDL Ratio: 3
Triglycerides: 120 mg/dL (ref 0.0–149.0)
VLDL: 24 mg/dL (ref 0.0–40.0)

## 2021-12-25 MED ORDER — AMOXICILLIN 500 MG PO CAPS: ORAL_CAPSULE | ORAL | 4 refills | Status: DC

## 2021-12-25 MED ORDER — MELOXICAM 15 MG PO TABS: 15.0000 mg | ORAL_TABLET | Freq: Every day | ORAL | 1 refills | Status: DC

## 2021-12-25 MED ORDER — TRIAMTERENE-HCTZ 37.5-25 MG PO CAPS: ORAL_CAPSULE | ORAL | 1 refills | Status: DC

## 2021-12-25 MED ORDER — PRAVASTATIN SODIUM 40 MG PO TABS: ORAL_TABLET | ORAL | 1 refills | Status: DC

## 2021-12-25 MED ORDER — DILTIAZEM HCL ER COATED BEADS 300 MG PO CP24: ORAL_CAPSULE | ORAL | 1 refills | Status: DC

## 2021-12-25 MED ORDER — LISINOPRIL 30 MG PO TABS: ORAL_TABLET | ORAL | 1 refills | Status: DC

## 2021-12-25 MED ORDER — CITALOPRAM HYDROBROMIDE 10 MG PO TABS: 10.0000 mg | ORAL_TABLET | Freq: Every day | ORAL | 3 refills | Status: DC

## 2021-12-25 NOTE — Assessment & Plan Note (Signed)
Well controlled, no changes to meds. Encouraged heart healthy diet such as the DASH diet and exercise as tolerated.  °

## 2021-12-25 NOTE — Assessment & Plan Note (Signed)
Encourage heart healthy diet such as MIND or DASH diet, increase exercise, avoid trans fats, simple carbohydrates and processed foods, consider a krill or fish or flaxseed oil cap daily.  °

## 2021-12-25 NOTE — Patient Instructions (Signed)

## 2021-12-25 NOTE — Progress Notes (Signed)
? ?Subjective:  ? ?By signing my name below, I, Shehryar Baig, attest that this documentation has been prepared under the direction and in the presence of Dr. Roma Schanz, DO. 12/25/2021 ? ? ? Patient ID: Kimberly Edwards, female    DOB: 20-Sep-1941, 81 y.o.   MRN: [id number] ? ?Chief Complaint  ?Patient presents with  ? Hypertension  ?  Here for follow up   ? ? ?Hypertension ?Pertinent negatives include no blurred vision, chest pain, headaches, malaise/fatigue, palpitations or shortness of breath.  ?Patient is in today for a office visit.  ?She is requesting a refill on 500 mg amoxicillin.  ?She continues having left shoulder pain. She reports her orthopedist surgeon recommended against a left shoulder replacement procedure due to notes having enough bone. Instead she is receiving regular injections in her shoulder and regularly applying Voltaren gel and reports finding mild relief. She also notes that her surgeon is willing to do surgery if her pain worsens and becomes severe. She continues applying CBD cream eating CBD gummy's to manage her pain and finds relief. ? ?Her blood pressure is doing well during this visit.  ?BP Readings from Last 3 Encounters:  ?12/25/21 130/80  ?07/03/21 130/88  ?05/30/21 123/64  ? ?Pulse Readings from Last 3 Encounters:  ?12/25/21 74  ?07/03/21 68  ?05/30/21 (!) 50  ? ?She reports since her last fall she lost weight due to not being able to eat as much. She continues having issues biting into things which is causing her to eat less. She reports she typically eats eggs for breakfast and pasta for dinner.  ?Wt Readings from Last 3 Encounters:  ?12/25/21 136 lb (61.7 kg)  ?09/18/21 145 lb (65.8 kg)  ?07/03/21 145 lb (65.8 kg)  ? ? ?Past Medical History:  ?Diagnosis Date  ? Arthritis   ? Depression   ? Heart murmur   ? Hyperlipidemia   ? Hypertension   ? Knee joint replacement status, left 2003  ? PONV (postoperative nausea and vomiting)   ? Right knee DJD   ? ? ?Past Surgical  History:  ?Procedure Laterality Date  ? BREAST BIOPSY  2000  ? marker put in  ? CHOLECYSTECTOMY  1998  ? JOINT REPLACEMENT    ? REPLACEMENT TOTAL KNEE    ? left knee  ? TONSILLECTOMY    ? TOTAL KNEE ARTHROPLASTY  03/23/2012  ? Procedure: TOTAL KNEE ARTHROPLASTY;  Surgeon: Lorn Junes, MD;  Location: [facility];  Service: Orthopedics;  Laterality: Right;  right total knee arthroplasty  ? ? ?Family History  ?Problem Relation Age of Onset  ? Hypertension Father   ? Breast cancer Sister   ? ? ?Social History  ? ?Socioeconomic History  ? Marital status: Widowed  ?  Spouse name: Not on file  ? Number of children: Not on file  ? Years of education: Not on file  ? Highest education level: Not on file  ?Occupational History  ? Occupation: retired Theme park manager  ?Tobacco Use  ? Smoking status: Former  ?  Packs/day: 0.10  ?  Years: 50.00  ?  Pack years: 5.00  ?  Types: Cigarettes  ?  Quit date: 03/23/2012  ?  Years since quitting: 9.7  ? Smokeless tobacco: Never  ? Tobacco comments:  ?  social and weekends alcohol  ?Substance and Sexual Activity  ? Alcohol use: Yes  ?  Comment: Wine on weekends  ? Drug use: No  ? Sexual activity: Not Currently  ?  Partners:  Male  ?  Birth control/protection: Post-menopausal  ?Other Topics Concern  ? Not on file  ?Social History Narrative  ? Regular exercise: walk 2 times a week  ? Caffeine use: 2 cups of coffee daily  ?   ?   ? ?Social Determinants of Health  ? ?Financial Resource Strain: Low Risk   ? Difficulty of Paying Living Expenses: Not hard at all  ?Food Insecurity: No Food Insecurity  ? Worried About Charity fundraiser in the Last Year: Never true  ? Ran Out of Food in the Last Year: Never true  ?Transportation Needs: No Transportation Needs  ? Lack of Transportation (Medical): No  ? Lack of Transportation (Non-Medical): No  ?Physical Activity: Sufficiently Active  ? Days of Exercise per Week: 3 days  ? Minutes of Exercise per Session: 60 min  ?Stress: No Stress Concern Present  ? Feeling  of Stress : Not at all  ?Social Connections: Moderately Integrated  ? Frequency of Communication with Friends and Family: More than three times a week  ? Frequency of Social Gatherings with Friends and Family: More than three times a week  ? Attends Religious Services: More than 4 times per year  ? Active Member of Clubs or Organizations: Yes  ? Attends Archivist Meetings: More than 4 times per year  ? Marital Status: Widowed  ?Intimate Partner Violence: Not At Risk  ? Fear of Current or Ex-Partner: No  ? Emotionally Abused: No  ? Physically Abused: No  ? Sexually Abused: No  ? ? ?Outpatient Medications Prior to Visit  ?Medication Sig Dispense Refill  ? aspirin 81 MG tablet Take 81 mg by mouth daily.    ? Calcium Carb-Cholecalciferol (CALCIUM 500 + D PO) Take 1 tablet by mouth in the morning and at bedtime.    ? fish oil-omega-3 fatty acids 1000 MG capsule Take 2 g by mouth daily.     ? GLUCOSAMINE-CHONDROITIN PO Take 1 tablet by mouth daily. '1500mg'$  of glucosamine    ? Multiple Vitamins-Minerals (MULTIVITAMIN WITH MINERALS) tablet Take 2 tablets by mouth daily. Vitafusion gummie    ? Polyethyl Glycol-Propyl Glycol 0.4-0.3 % SOLN Apply 1 drop to eye daily.     ? amoxicillin (AMOXIL) 500 MG capsule 4 po 1 hr prior to procedure 4 capsule 4  ? citalopram (CELEXA) 10 MG tablet Take 1 tablet (10 mg total) by mouth daily. 90 tablet 3  ? diltiazem (CARDIZEM CD) 300 MG 24 hr capsule TAKE ONE CAPSULE BY MOUTH BEFORE BREAKFAST 90 capsule 1  ? lisinopril (ZESTRIL) 30 MG tablet TAKE ONE TABLET BY MOUTH BEFORE BREAKFAST 90 tablet 1  ? meloxicam (MOBIC) 15 MG tablet Take 1 tablet (15 mg total) by mouth daily. 90 tablet 1  ? pravastatin (PRAVACHOL) 40 MG tablet TAKE ONE TABLET BY MOUTH BEFORE BREAKFAST 90 tablet 1  ? triamterene-hydrochlorothiazide (DYAZIDE) 37.5-25 MG capsule TAKE ONE CAPSULE BY MOUTH BEFORE BREAKFAST 90 capsule 1  ? ?No facility-administered medications prior to visit.  ? ? ?Allergies  ?Allergen  Reactions  ? Oxycodone Nausea And Vomiting  ? ? ?Review of Systems  ?Constitutional:  Negative for fever and malaise/fatigue.  ?HENT:  Negative for congestion.   ?Eyes:  Negative for blurred vision.  ?Respiratory:  Negative for shortness of breath.   ?Cardiovascular:  Negative for chest pain, palpitations and leg swelling.  ?Gastrointestinal:  Negative for abdominal pain, blood in stool and nausea.  ?Genitourinary:  Negative for dysuria and frequency.  ?Musculoskeletal:  Negative for falls.  ?     (+)left shoulder pain  ?Skin:  Negative for rash.  ?Neurological:  Negative for dizziness, loss of consciousness and headaches.  ?Endo/Heme/Allergies:  Negative for environmental allergies.  ?Psychiatric/Behavioral:  Negative for depression. The patient is not nervous/anxious.   ? ?   ?Objective:  ?  ?Physical Exam ?Vitals and nursing note reviewed.  ?Constitutional:   ?   General: She is not in acute distress. ?   Appearance: Normal appearance. She is well-developed. She is not ill-appearing.  ?HENT:  ?   Head: Normocephalic and atraumatic.  ?   Right Ear: External ear normal.  ?   Left Ear: External ear normal.  ?Eyes:  ?   Extraocular Movements: Extraocular movements intact.  ?   Conjunctiva/sclera: Conjunctivae normal.  ?   Pupils: Pupils are equal, round, and reactive to light.  ?Neck:  ?   Thyroid: No thyromegaly.  ?   Vascular: No carotid bruit or JVD.  ?Cardiovascular:  ?   Rate and Rhythm: Normal rate and regular rhythm.  ?   Heart sounds: Normal heart sounds. No murmur heard. ?  No gallop.  ?Pulmonary:  ?   Effort: Pulmonary effort is normal. No respiratory distress.  ?   Breath sounds: Normal breath sounds. No wheezing or rales.  ?Chest:  ?   Chest wall: No tenderness.  ?Musculoskeletal:  ?   Cervical back: Normal range of motion and neck supple.  ?Skin: ?   General: Skin is warm and dry.  ?Neurological:  ?   Mental Status: She is alert and oriented to person, place, and time.  ?Psychiatric:     ?   Judgment:  Judgment normal.  ? ? ?BP 130/80 (BP Location: Right Arm, Patient Position: Sitting, Cuff Size: Normal)   Pulse 74   Temp 98.2 ?F (36.8 ?C) (Oral)   Resp 16   Ht '4\' 10"'$  (1.473 m)   Wt 136 lb (61.7 kg)   SpO2 95%

## 2021-12-25 NOTE — Assessment & Plan Note (Signed)
con't celexa  ?

## 2022-01-31 DIAGNOSIS — M25511 Pain in right shoulder: Secondary | ICD-10-CM | POA: Diagnosis not present

## 2022-02-10 ENCOUNTER — Emergency Department (HOSPITAL_BASED_OUTPATIENT_CLINIC_OR_DEPARTMENT_OTHER)
Admission: EM | Admit: 2022-02-10 | Discharge: 2022-02-10 | Disposition: A | Discharge status: Home / Self Care | Payer: PPO | Attending: Emergency Medicine | Admitting: Emergency Medicine

## 2022-02-10 ENCOUNTER — Encounter (HOSPITAL_BASED_OUTPATIENT_CLINIC_OR_DEPARTMENT_OTHER): Payer: Self-pay | Admitting: Emergency Medicine

## 2022-02-10 ENCOUNTER — Other Ambulatory Visit: Payer: Self-pay

## 2022-02-10 ENCOUNTER — Emergency Department (HOSPITAL_BASED_OUTPATIENT_CLINIC_OR_DEPARTMENT_OTHER): Payer: PPO

## 2022-02-10 DIAGNOSIS — N390 Urinary tract infection, site not specified: Secondary | ICD-10-CM

## 2022-02-10 DIAGNOSIS — N3001 Acute cystitis with hematuria: Secondary | ICD-10-CM | POA: Diagnosis not present

## 2022-02-10 DIAGNOSIS — R1031 Right lower quadrant pain: Secondary | ICD-10-CM | POA: Insufficient documentation

## 2022-02-10 DIAGNOSIS — K5730 Diverticulosis of large intestine without perforation or abscess without bleeding: Secondary | ICD-10-CM | POA: Diagnosis not present

## 2022-02-10 DIAGNOSIS — Z7982 Long term (current) use of aspirin: Secondary | ICD-10-CM | POA: Diagnosis not present

## 2022-02-10 DIAGNOSIS — N281 Cyst of kidney, acquired: Secondary | ICD-10-CM | POA: Diagnosis not present

## 2022-02-10 DIAGNOSIS — R197 Diarrhea, unspecified: Secondary | ICD-10-CM | POA: Diagnosis present

## 2022-02-10 DIAGNOSIS — R319 Hematuria, unspecified: Secondary | ICD-10-CM | POA: Diagnosis not present

## 2022-02-10 LAB — CBC WITH DIFFERENTIAL/PLATELET
Abs Immature Granulocytes: 0.06 10*3/uL (ref 0.00–0.07)
Basophils Absolute: 0 10*3/uL (ref 0.0–0.1)
Basophils Relative: 0 %
Eosinophils Absolute: 0 10*3/uL (ref 0.0–0.5)
Eosinophils Relative: 0 %
HCT: 38.8 % (ref 36.0–46.0)
Hemoglobin: 13.2 g/dL (ref 12.0–15.0)
Immature Granulocytes: 1 %
Lymphocytes Relative: 6 %
Lymphs Abs: 0.5 10*3/uL — ABNORMAL LOW (ref 0.7–4.0)
MCH: 30.2 pg (ref 26.0–34.0)
MCHC: 34 g/dL (ref 30.0–36.0)
MCV: 88.8 fL (ref 80.0–100.0)
Monocytes Absolute: 0.5 10*3/uL (ref 0.1–1.0)
Monocytes Relative: 7 %
Neutro Abs: 7.1 10*3/uL (ref 1.7–7.7)
Neutrophils Relative %: 86 %
Platelets: 140 10*3/uL — ABNORMAL LOW (ref 150–400)
RBC: 4.37 MIL/uL (ref 3.87–5.11)
RDW: 13.1 % (ref 11.5–15.5)
WBC: 8.2 10*3/uL (ref 4.0–10.5)
nRBC: 0 % (ref 0.0–0.2)

## 2022-02-10 LAB — URINALYSIS, ROUTINE W REFLEX MICROSCOPIC
Bilirubin Urine: NEGATIVE
Glucose, UA: NEGATIVE mg/dL
Ketones, ur: NEGATIVE mg/dL
Nitrite: NEGATIVE
Protein, ur: 100 mg/dL — AB
Specific Gravity, Urine: 1.01 (ref 1.005–1.030)
pH: 5.5 (ref 5.0–8.0)

## 2022-02-10 LAB — COMPREHENSIVE METABOLIC PANEL
ALT: 17 U/L (ref 0–44)
AST: 22 U/L (ref 15–41)
Albumin: 2.8 g/dL — ABNORMAL LOW (ref 3.5–5.0)
Alkaline Phosphatase: 63 U/L (ref 38–126)
Anion gap: 8 (ref 5–15)
BUN: 29 mg/dL — ABNORMAL HIGH (ref 8–23)
CO2: 25 mmol/L (ref 22–32)
Calcium: 8.5 mg/dL — ABNORMAL LOW (ref 8.9–10.3)
Chloride: 101 mmol/L (ref 98–111)
Creatinine, Ser: 0.89 mg/dL (ref 0.44–1.00)
GFR, Estimated: >60 mL/min (ref >60)
Glucose, Bld: 110 mg/dL — ABNORMAL HIGH (ref 70–99)
Potassium: 3.7 mmol/L (ref 3.5–5.1)
Sodium: 134 mmol/L — ABNORMAL LOW (ref 135–145)
Total Bilirubin: 0.9 mg/dL (ref 0.3–1.2)
Total Protein: 6.3 g/dL — ABNORMAL LOW (ref 6.5–8.1)

## 2022-02-10 LAB — LIPASE, BLOOD: Lipase: 22 U/L (ref 11–51)

## 2022-02-10 LAB — URINALYSIS, MICROSCOPIC (REFLEX)

## 2022-02-10 MED ORDER — CEPHALEXIN 500 MG PO CAPS: 500.0000 mg | ORAL_CAPSULE | Freq: Three times a day (TID) | ORAL | 0 refills | Status: AC

## 2022-02-10 MED ORDER — IOHEXOL 300 MG/ML  SOLN
75.0000 mL | Freq: Once | INTRAMUSCULAR | Status: AC | PRN
  Administered 2022-02-10: 75 mL via INTRAVENOUS

## 2022-02-10 MED ORDER — ONDANSETRON HCL 4 MG/2ML IJ SOLN
4.0000 mg | Freq: Once | INTRAMUSCULAR | Status: AC
  Administered 2022-02-10: 4 mg via INTRAVENOUS
  Filled 2022-02-10: qty 2

## 2022-02-10 MED ORDER — SODIUM CHLORIDE 0.9 % IV SOLN
1.0000 g | Freq: Once | INTRAVENOUS | Status: AC
  Administered 2022-02-10: 1 g via INTRAVENOUS
  Filled 2022-02-10: qty 10

## 2022-02-10 MED ORDER — SODIUM CHLORIDE 0.9 % IV BOLUS
500.0000 mL | Freq: Once | INTRAVENOUS | Status: AC
  Administered 2022-02-10: 500 mL via INTRAVENOUS

## 2022-02-10 MED ORDER — MORPHINE SULFATE (PF) 2 MG/ML IV SOLN
2.0000 mg | Freq: Once | INTRAVENOUS | Status: AC
  Administered 2022-02-10: 2 mg via INTRAVENOUS
  Filled 2022-02-10: qty 1

## 2022-02-10 NOTE — ED Provider Notes (Signed)
?[facility] ?Provider Note ? ? ?CSN: [id number] ?Arrival date & time: 02/10/22  7846 ? ?  ? ?History ? ?Chief Complaint  ?Patient presents with  ? Fever  ? ? ?Kimberly Edwards is a 81 y.o. female. ? ?Presents chief complaint of fever, chills, diarrhea nonbloody and abdominal pain.  Symptoms started last night.  Describes abdominal pain right lower quadrant.  Nonradiating otherwise.  Denies any vomiting.  Denies chest pain. ? ? ?  ? ?Home Medications ?Prior to Admission medications   ?Medication Sig Start Date End Date Taking? Authorizing Provider  ?aspirin 81 MG tablet Take 81 mg by mouth daily.   Yes [provider]  ?Calcium Carb-Cholecalciferol (CALCIUM 500 + D PO) Take 1 tablet by mouth in the morning and at bedtime.   Yes [provider]  ?cephALEXin (KEFLEX) 500 MG capsule Take 1 capsule (500 mg total) by mouth 3 (three) times daily for 10 days. 02/10/22 02/20/22 Yes Luna Fuse, MD  ?citalopram (CELEXA) 10 MG tablet Take 1 tablet (10 mg total) by mouth daily. 12/25/21  Yes Roma Schanz R, DO  ?diltiazem (CARDIZEM CD) 300 MG 24 hr capsule TAKE ONE CAPSULE BY MOUTH BEFORE BREAKFAST 12/25/21  Yes Roma Schanz R, DO  ?fish oil-omega-3 fatty acids 1000 MG capsule Take 2 g by mouth daily.    Yes [provider]  ?GLUCOSAMINE-CHONDROITIN PO Take 1 tablet by mouth daily. '1500mg'$  of glucosamine   Yes [provider]  ?lisinopril (ZESTRIL) 30 MG tablet TAKE ONE TABLET BY MOUTH BEFORE BREAKFAST 12/25/21  Yes Ann Held, DO  ?meloxicam (MOBIC) 15 MG tablet Take 1 tablet (15 mg total) by mouth daily. 12/25/21  Yes Ann Held, DO  ?Multiple Vitamins-Minerals (MULTIVITAMIN WITH MINERALS) tablet Take 2 tablets by mouth daily. Vitafusion gummie   Yes [provider]  ?Polyethyl Glycol-Propyl Glycol 0.4-0.3 % SOLN Apply 1 drop to eye daily.    Yes [provider]  ?triamterene-hydrochlorothiazide (DYAZIDE) 37.5-25  MG capsule TAKE ONE CAPSULE BY MOUTH BEFORE BREAKFAST 12/25/21  Yes Roma Schanz R, DO  ?pravastatin (PRAVACHOL) 40 MG tablet TAKE ONE TABLET BY MOUTH BEFORE BREAKFAST 12/25/21   Ann Held, DO  ?   ? ?Allergies    ?Oxycodone   ? ?Review of Systems   ?Review of Systems  ?Constitutional:  Positive for fever.  ?HENT:  Negative for ear pain.   ?Eyes:  Negative for pain.  ?Respiratory:  Negative for cough.   ?Cardiovascular:  Negative for chest pain.  ?Gastrointestinal:  Positive for abdominal pain.  ?Genitourinary:  Negative for flank pain.  ?Musculoskeletal:  Negative for back pain.  ?Skin:  Negative for rash.  ?Neurological:  Negative for headaches.  ? ?Physical Exam ?Updated Vital Signs ?BP (!) 147/72   Pulse 73   Temp 99.1 ?F (37.3 ?C) (Oral)   Resp 16   Ht '4\' 10"'$  (1.473 m)   Wt 61.2 kg   SpO2 95%   BMI 28.22 kg/m?  ?Physical Exam ?Constitutional:   ?   General: She is not in acute distress. ?   Appearance: Normal appearance.  ?HENT:  ?   Head: Normocephalic.  ?   Nose: Nose normal.  ?Eyes:  ?   Extraocular Movements: Extraocular movements intact.  ?Cardiovascular:  ?   Rate and Rhythm: Normal rate.  ?Pulmonary:  ?   Effort: Pulmonary effort is normal.  ?Abdominal:  ?   Tenderness: There is abdominal tenderness.  There is no guarding or rebound.  ?   Comments: Right lower quadrant tenderness on exam.  ?Musculoskeletal:     ?   General: Normal range of motion.  ?   Cervical back: Normal range of motion.  ?Neurological:  ?   General: No focal deficit present.  ?   Mental Status: She is alert. Mental status is at baseline.  ? ? ?ED Results / Procedures / Treatments   ?Labs ?(all labs ordered are listed, but only abnormal results are displayed) ?Labs Reviewed  ?CBC WITH DIFFERENTIAL/PLATELET - Abnormal; Notable for the following components:  ?    Result Value  ? Platelets 140 (*)   ? Lymphs Abs 0.5 (*)   ? All other components within normal limits  ?COMPREHENSIVE METABOLIC PANEL - Abnormal;  Notable for the following components:  ? Sodium 134 (*)   ? Glucose, Bld 110 (*)   ? BUN 29 (*)   ? Calcium 8.5 (*)   ? Total Protein 6.3 (*)   ? Albumin 2.8 (*)   ? All other components within normal limits  ?URINALYSIS, ROUTINE W REFLEX MICROSCOPIC - Abnormal; Notable for the following components:  ? Hgb urine dipstick SMALL (*)   ? Protein, ur 100 (*)   ? Leukocytes,Ua TRACE (*)   ? All other components within normal limits  ?URINALYSIS, MICROSCOPIC (REFLEX) - Abnormal; Notable for the following components:  ? Bacteria, UA FEW (*)   ? All other components within normal limits  ?URINE CULTURE  ?LIPASE, BLOOD  ? ? ?EKG ?None ? ?Radiology ?CT ABDOMEN PELVIS W CONTRAST ? ?Result Date: 02/10/2022 ?CLINICAL DATA:  81 year old female with acute abdominal and pelvic pain with fever and diarrhea. EXAM: CT ABDOMEN AND PELVIS WITH CONTRAST TECHNIQUE: Multidetector CT imaging of the abdomen and pelvis was performed using the standard protocol following bolus administration of intravenous contrast. RADIATION DOSE REDUCTION: This exam was performed according to the departmental dose-optimization program which includes automated exposure control, adjustment of the mA and/or kV according to patient size and/or use of iterative reconstruction technique. CONTRAST:  103m OMNIPAQUE IOHEXOL 300 MG/ML  SOLN COMPARISON:  05/28/2021 CT FINDINGS: Lower chest: Mild bibasilar atelectasis is identified. Hepatobiliary: The liver is unremarkable except for unchanged mild intrahepatic biliary fullness. Patient is status post cholecystectomy. No CBD dilatation noted. Pancreas: Unremarkable Spleen: UPPER limits normal spleen size Adrenals/Urinary Tract: There are mild wedge-shaped areas of decreased enhancement within the RIGHT kidney with new mild perinephric stranding, suspicious for pyelonephritis. There is no evidence of hydronephrosis or findings to suggest renal abscess. The RIGHT renal vein and artery appear patent. LEFT renal cysts are  identified for which no further follow-up is recommended. The adrenal glands and bladder are unremarkable. Stomach/Bowel: Stomach is within normal limits. Appendix is unremarkable. No evidence of bowel wall thickening, distention, or inflammatory changes. Colonic diverticulosis noted without evidence of acute diverticulitis. Vascular/Lymphatic: Aortic atherosclerosis. No enlarged abdominal or pelvic lymph nodes. Reproductive: No significant abnormalities Other: No ascites, focal collection or pneumoperitoneum. Musculoskeletal: No acute abnormality. Moderate to severe multilevel degenerative changes throughout the lumbar spine again noted. IMPRESSION: 1. Mild wedge-shaped areas of decreased enhancement within the RIGHT kidney with new mild perinephric stranding, suspicious for pyelonephritis. No evidence of hydronephrosis or renal abscess. 2. Aortic Atherosclerosis (ICD10-I70.0). Electronically Signed   By: JMargarette CanadaM.D.   On: 02/10/2022 12:33   ? ?Procedures ?Procedures  ? ? ?Medications Ordered in ED ?Medications  ?sodium chloride 0.9 % bolus 500 mL (0 mLs Intravenous  Stopped 02/10/22 1124)  ?morphine (PF) 2 MG/ML injection 2 mg (2 mg Intravenous Given 02/10/22 1039)  ?ondansetron (ZOFRAN) injection 4 mg (4 mg Intravenous Given 02/10/22 1040)  ?iohexol (OMNIPAQUE) 300 MG/ML solution 75 mL (75 mLs Intravenous Contrast Given 02/10/22 1152)  ?cefTRIAXone (ROCEPHIN) 1 g in sodium chloride 0.9 % 100 mL IVPB (0 g Intravenous Stopped 02/10/22 1352)  ? ? ?ED Course/ Medical Decision Making/ A&P ?  ?                        ?Medical Decision Making ?Amount and/or Complexity of Data Reviewed ?Labs: ordered. ?Radiology: ordered. ? ?Risk ?Prescription drug management. ? ? ?Chart review shows outpatient visit December 25, 2021. ? ?Cardiac monitoring shows sinus rhythm. ? ?History obtained from family at bedside as well. ? ?Patient given IV morphine and IV fluid for symptom management. ? ?Diagnostic studies included labs White count  of 8 hemoglobin 13 chemistry unremarkable. ? ?CT abdomen pelvis pursued.  Concerning for pyelonephritis.  No evidence of acute appendicitis noted.  Patient given antibiotics, advised outpatient follow-up with her doc

## 2022-02-10 NOTE — ED Triage Notes (Signed)
Pt brought in by family with c/o fever, chills,  diarrhea right sided abdominal pain. Family tested patient for COVID, results negative. ?

## 2022-02-10 NOTE — Discharge Instructions (Signed)
Call your primary care doctor or specialist as discussed in the next 2-3 days.   Return immediately back to the ER if:  Your symptoms worsen within the next 12-24 hours. You develop new symptoms such as new fevers, persistent vomiting, new pain, shortness of breath, or new weakness or numbness, or if you have any other concerns.  

## 2022-02-10 NOTE — ED Notes (Signed)
Patient states that she is having rt side pain that started last night. States that she did a covid test ,but it was negative. Patient states that she fells weak.Patient states that she had diarrhea last night. Denies any nause vomiting. Patient states that she had a fever last night took tylenol for relief. ?

## 2022-02-11 ENCOUNTER — Encounter: Payer: Self-pay | Admitting: Family Medicine

## 2022-02-12 LAB — URINE CULTURE: Culture: 20000 — AB

## 2022-02-13 ENCOUNTER — Telehealth: Payer: Self-pay | Admitting: *Deleted

## 2022-02-13 NOTE — Telephone Encounter (Signed)
Post ED Visit - Positive Culture Follow-up ? ?Culture report reviewed by antimicrobial stewardship pharmacist: ?[facility] ?'[]'$  Elenor Quinones, Pharm.D. ?'[]'$  Heide Guile, Pharm.D., BCPS AQ-ID ?'[]'$  Parks Neptune, Pharm.D., BCPS ?'[]'$  Alycia Rossetti, Pharm.D., BCPS ?'[]'$  Apison, Pharm.D., BCPS, AAHIVP ?'[]'$  Legrand Como, Pharm.D., BCPS, AAHIVP ?'[]'$  Salome Arnt, PharmD, BCPS ?'[]'$  Johnnette Gourd, PharmD, BCPS ?'[]'$  Hughes Better, PharmD, BCPS ?'[]'$  Leeroy Cha, PharmD ?'[]'$  Laqueta Linden, PharmD, BCPS ?'[]'$  Albertina Parr, PharmD ? ?[facility] ?'[]'$  Leodis Sias, PharmD ?'[]'$  Lindell Spar, PharmD ?'[]'$  Royetta Asal, PharmD ?'[]'$  Graylin Shiver, Rph ?'[]'$  Rema Fendt) Glennon Mac, PharmD ?'[]'$  Arlyn Dunning, PharmD ?'[]'$  Netta Cedars, PharmD ?'[]'$  Dia Sitter, PharmD ?'[]'$  Leone Haven, PharmD ?'[]'$  Gretta Arab, PharmD ?'[]'$  Theodis Shove, PharmD ?'[]'$  Peggyann Juba, PharmD ?'[]'$  Reuel Boom, PharmD ? ? ?Positive urine culture ?Treated with Cephalexin, organism sensitive to the same and no further patient follow-up is required at this time.  Nicole Cella PharmD ? ?Ardeen Fillers ?02/13/2022, 11:23 AM ?  ?

## 2022-02-19 ENCOUNTER — Ambulatory Visit (INDEPENDENT_AMBULATORY_CARE_PROVIDER_SITE_OTHER): Payer: PPO | Admitting: Family Medicine

## 2022-02-19 ENCOUNTER — Encounter: Payer: Self-pay | Admitting: Family Medicine

## 2022-02-19 VITALS — BP 138/88 | HR 81 | Temp 98.5°F | Resp 18 | Ht <= 58 in | Wt 134.4 lb

## 2022-02-19 DIAGNOSIS — N390 Urinary tract infection, site not specified: Secondary | ICD-10-CM

## 2022-02-19 DIAGNOSIS — R319 Hematuria, unspecified: Secondary | ICD-10-CM

## 2022-02-19 LAB — POC URINALSYSI DIPSTICK (AUTOMATED)
Blood, UA: NEGATIVE
Glucose, UA: NEGATIVE
Ketones, UA: NEGATIVE
Leukocytes, UA: NEGATIVE
Nitrite, UA: NEGATIVE
Protein, UA: POSITIVE — AB
Spec Grav, UA: 1.02 (ref 1.010–1.025)
Urobilinogen, UA: 0.2 E.U./dL
pH, UA: 5 (ref 5.0–8.0)

## 2022-02-19 NOTE — Patient Instructions (Signed)
Pyelonephritis, Adult Pyelonephritis is an infection that occurs in the kidney. The kidneys are the organs that filter a person's blood and move waste out of the bloodstream and into the urine. Urine passes from the kidneys, through tubes called ureters, and into the bladder. There are two main types of pyelonephritis: Infections that come on quickly without any warning (acute pyelonephritis). Infections that last for a long period of time (chronic pyelonephritis). In most cases, the infection clears up with treatment and does not cause further problems. More severe infections or chronic infections can sometimes spread to the bloodstream or lead to other problems with the kidneys. What are the causes? This condition is usually caused by: Bacteria traveling from the bladder up to the kidney. This may occur after having a bladder infection (cystitis) or urinary tract infection (UTI). Bladder infections caused from bacteria traveling from the bloodstream to the kidney. What increases the risk? This condition is more likely to develop in: Pregnant women. Older people. People who have any of these conditions: Diabetes. Inflammation of the prostate gland (prostatitis), in males. Kidney stones or bladder stones. Other abnormalities of the kidney or ureter. Cancer. People who have a catheter placed in the bladder. People who are sexually active. Women who use spermicides. People who have had a prior UTI. What are the signs or symptoms? Symptoms of this condition include: Frequent urination. Strong or persistent urge to urinate. Burning or stinging when urinating. Abdominal pain. Back pain. Pain in the side or flank area. Fever or chills. Blood in the urine, or dark urine. Nausea or vomiting. How is this diagnosed? This condition may be diagnosed based on: Your medical history and a physical exam. Urine tests. Blood tests. You may also have imaging tests of the kidneys, such as an  ultrasound or CT scan. How is this treated? Treatment for this condition may depend on the severity of the infection. If the infection is mild and is found early, you may be treated with antibiotic medicines taken by mouth (orally). You will need to drink fluids to remain hydrated. If the infection is more severe, you may need to stay in the hospital and receive antibiotics given directly into a vein through an IV. You may also need to receive fluids through an IV if you are not able to remain hydrated. After your hospital stay, you may need to take oral antibiotics for a period of time. Other treatments may be required, depending on the cause of the infection. Follow these instructions at home: Medicines Take your antibiotic medicine as told by your health care provider. Do not stop taking the antibiotic even if you start to feel better. Take over-the-counter and prescription medicines only as told by your health care provider. General instructions  Drink enough fluid to keep your urine pale yellow. Avoid caffeine, tea, and carbonated beverages. They tend to irritate the bladder. Urinate often. Avoid holding in urine for long periods of time. Urinate before and after sex. After a bowel movement, women should cleanse from front to back. Use each tissue only once. Keep all follow-up visits as told by your health care provider. This is important. Contact a health care provider if: Your symptoms do not get better after 2 days of treatment. Your symptoms get worse. You have a fever. Get help right away if you: Are unable to take your antibiotics or fluids. Have shaking chills. Vomit. Have severe flank or back pain. Have extreme weakness or fainting. Summary Pyelonephritis is a urinary tract infection (  UTI) that occurs in the kidney. Treatment for this condition may depend on the severity of the infection. Take your antibiotic medicine as told by your health care provider. Do not stop  taking the antibiotic even if you start to feel better. Drink enough fluid to keep your urine pale yellow. Keep all follow-up visits as told by your health care provider. This is important. This information is not intended to replace advice given to you by your health care provider. Make sure you discuss any questions you have with your health care provider. Document Revised: 05/10/2021 Document Reviewed: 05/10/2021 Elsevier Patient Education  2023 Else[employer] Inc.  

## 2022-02-19 NOTE — Assessment & Plan Note (Signed)
ua today normal  ?rto prn  ?

## 2022-02-19 NOTE — Progress Notes (Signed)
? ?Subjective:  ? ?By signing my name below, I, Shehryar Baig, attest that this documentation has been prepared under the direction and in the presence of Ann Held, DO. 02/19/2022 ? ? ? Patient ID: Kimberly Edwards, female    DOB: 01/26/1941, 81 y.o.   MRN: [id number] ? ?Chief Complaint  ?Patient presents with  ? ER follow up  ? ? ?HPI ?Patient is in today for a ER follow up.  ? ?She was admitted to the ER on 02/10/2022 for UTI with hematuria. She discharged on the same day and was given 500 mg Keflex 3 daily.  ?She reports feeling fatigued and dehydrated on Saturday, 02/09/2022. The next day she work up with bad abdominal pain and was then taken to the ER. Since being discharged from the ER, she found her symptoms have greatly improved. She is drinking more water daily. She is also incorporating protein shakes in her daily diet. She continues feeling mildly fatigued. She also notes losing some weight but thinks it may be due to drinking more water. She has no abdominal pain or hematuria at this time.  ? ? ?Past Medical History:  ?Diagnosis Date  ? Arthritis   ? Depression   ? Heart murmur   ? Hyperlipidemia   ? Hypertension   ? Knee joint replacement status, left 2003  ? PONV (postoperative nausea and vomiting)   ? Right knee DJD   ? ? ?Past Surgical History:  ?Procedure Laterality Date  ? BREAST BIOPSY  2000  ? marker put in  ? CHOLECYSTECTOMY  1998  ? JOINT REPLACEMENT    ? REPLACEMENT TOTAL KNEE    ? left knee  ? TONSILLECTOMY    ? TOTAL KNEE ARTHROPLASTY  03/23/2012  ? Procedure: TOTAL KNEE ARTHROPLASTY;  Surgeon: Lorn Junes, MD;  Location: [facility];  Service: Orthopedics;  Laterality: Right;  right total knee arthroplasty  ? ? ?Family History  ?Problem Relation Age of Onset  ? Hypertension Father   ? Breast cancer Sister   ? ? ?Social History  ? ?Socioeconomic History  ? Marital status: Widowed  ?  Spouse name: Not on file  ? Number of children: Not on file  ? Years of education: Not on file  ?  Highest education level: Not on file  ?Occupational History  ? Occupation: retired Theme park manager  ?Tobacco Use  ? Smoking status: Former  ?  Packs/day: 0.10  ?  Years: 50.00  ?  Pack years: 5.00  ?  Types: Cigarettes  ?  Quit date: 03/23/2012  ?  Years since quitting: 9.9  ? Smokeless tobacco: Never  ? Tobacco comments:  ?  social and weekends alcohol  ?Vaping Use  ? Vaping Use: Never used  ?Substance and Sexual Activity  ? Alcohol use: Yes  ?  Comment: Wine on weekends  ? Drug use: No  ? Sexual activity: Not Currently  ?  Partners: Male  ?  Birth control/protection: Post-menopausal  ?Other Topics Concern  ? Not on file  ?Social History Narrative  ? Regular exercise: walk 2 times a week  ? Caffeine use: 2 cups of coffee daily  ?   ?   ? ?Social Determinants of Health  ? ?Financial Resource Strain: Low Risk   ? Difficulty of Paying Living Expenses: Not hard at all  ?Food Insecurity: No Food Insecurity  ? Worried About Charity fundraiser in the Last Year: Never true  ? Ran Out of Food in the Last  Year: Never true  ?Transportation Needs: No Transportation Needs  ? Lack of Transportation (Medical): No  ? Lack of Transportation (Non-Medical): No  ?Physical Activity: Sufficiently Active  ? Days of Exercise per Week: 3 days  ? Minutes of Exercise per Session: 60 min  ?Stress: No Stress Concern Present  ? Feeling of Stress : Not at all  ?Social Connections: Moderately Integrated  ? Frequency of Communication with Friends and Family: More than three times a week  ? Frequency of Social Gatherings with Friends and Family: More than three times a week  ? Attends Religious Services: More than 4 times per year  ? Active Member of Clubs or Organizations: Yes  ? Attends Archivist Meetings: More than 4 times per year  ? Marital Status: Widowed  ?Intimate Partner Violence: Not At Risk  ? Fear of Current or Ex-Partner: No  ? Emotionally Abused: No  ? Physically Abused: No  ? Sexually Abused: No  ? ? ?Outpatient Medications  Prior to Visit  ?Medication Sig Dispense Refill  ? aspirin 81 MG tablet Take 81 mg by mouth daily.    ? Calcium Carb-Cholecalciferol (CALCIUM 500 + D PO) Take 1 tablet by mouth in the morning and at bedtime.    ? cephALEXin (KEFLEX) 500 MG capsule Take 1 capsule (500 mg total) by mouth 3 (three) times daily for 10 days. 30 capsule 0  ? citalopram (CELEXA) 10 MG tablet Take 1 tablet (10 mg total) by mouth daily. 90 tablet 3  ? diltiazem (CARDIZEM CD) 300 MG 24 hr capsule TAKE ONE CAPSULE BY MOUTH BEFORE BREAKFAST 90 capsule 1  ? fish oil-omega-3 fatty acids 1000 MG capsule Take 2 g by mouth daily.     ? GLUCOSAMINE-CHONDROITIN PO Take 1 tablet by mouth daily. '1500mg'$  of glucosamine    ? lisinopril (ZESTRIL) 30 MG tablet TAKE ONE TABLET BY MOUTH BEFORE BREAKFAST 90 tablet 1  ? meloxicam (MOBIC) 15 MG tablet Take 1 tablet (15 mg total) by mouth daily. 90 tablet 1  ? Multiple Vitamins-Minerals (MULTIVITAMIN WITH MINERALS) tablet Take 2 tablets by mouth daily. Vitafusion gummie    ? Polyethyl Glycol-Propyl Glycol 0.4-0.3 % SOLN Apply 1 drop to eye daily.     ? pravastatin (PRAVACHOL) 40 MG tablet TAKE ONE TABLET BY MOUTH BEFORE BREAKFAST 90 tablet 1  ? triamterene-hydrochlorothiazide (DYAZIDE) 37.5-25 MG capsule TAKE ONE CAPSULE BY MOUTH BEFORE BREAKFAST 90 capsule 1  ? ?No facility-administered medications prior to visit.  ? ? ?Allergies  ?Allergen Reactions  ? Oxycodone Nausea And Vomiting  ? ? ?Review of Systems  ?Constitutional:  Positive for malaise/fatigue (mild). Negative for chills and fever.  ?HENT:  Negative for congestion and hearing loss.   ?Eyes:  Negative for discharge.  ?Respiratory:  Negative for cough, sputum production and shortness of breath.   ?Cardiovascular:  Negative for chest pain, palpitations and leg swelling.  ?Gastrointestinal:  Negative for abdominal pain, blood in stool, constipation, diarrhea, heartburn, nausea and vomiting.  ?Genitourinary:  Negative for dysuria, frequency, hematuria and  urgency.  ?Musculoskeletal:  Negative for back pain, falls and myalgias.  ?Skin:  Negative for rash.  ?Neurological:  Negative for dizziness, sensory change, loss of consciousness, weakness and headaches.  ?Endo/Heme/Allergies:  Negative for environmental allergies. Does not bruise/bleed easily.  ?Psychiatric/Behavioral:  Negative for depression and suicidal ideas. The patient is not nervous/anxious and does not have insomnia.   ? ?   ?Objective:  ?  ?Physical Exam ?Vitals and nursing note reviewed.  ?  Constitutional:   ?   General: She is not in acute distress. ?   Appearance: Normal appearance. She is well-developed. She is not ill-appearing.  ?HENT:  ?   Head: Normocephalic and atraumatic.  ?   Right Ear: External ear normal.  ?   Left Ear: External ear normal.  ?Eyes:  ?   Extraocular Movements: Extraocular movements intact.  ?   Conjunctiva/sclera: Conjunctivae normal.  ?   Pupils: Pupils are equal, round, and reactive to light.  ?Neck:  ?   Thyroid: No thyromegaly.  ?   Vascular: No carotid bruit or JVD.  ?Cardiovascular:  ?   Rate and Rhythm: Normal rate and regular rhythm.  ?   Heart sounds: Normal heart sounds. No murmur heard. ?  No gallop.  ?Pulmonary:  ?   Effort: Pulmonary effort is normal. No respiratory distress.  ?   Breath sounds: Normal breath sounds. No wheezing or rales.  ?Chest:  ?   Chest wall: No tenderness.  ?Musculoskeletal:  ?   Cervical back: Normal range of motion and neck supple.  ?Skin: ?   General: Skin is warm and dry.  ?Neurological:  ?   Mental Status: She is alert and oriented to person, place, and time.  ?Psychiatric:     ?   Judgment: Judgment normal.  ? ? ?BP 138/88 (BP Location: Left Arm, Patient Position: Sitting, Cuff Size: Normal)   Pulse 81   Temp 98.5 ?F (36.9 ?C) (Oral)   Resp 18   Ht '4\' 10"'$  (1.473 m)   Wt 134 lb 6.4 oz (61 kg)   SpO2 96%   BMI 28.09 kg/m?  ?Wt Readings from Last 3 Encounters:  ?02/19/22 134 lb 6.4 oz (61 kg)  ?02/10/22 135 lb (61.2 kg)  ?12/25/21  136 lb (61.7 kg)  ? ? ?Diabetic Foot Exam - Simple   ?No data filed ?  ? ?Lab Results  ?Component Value Date  ? WBC 8.2 02/10/2022  ? HGB 13.2 02/10/2022  ? HCT 38.8 02/10/2022  ? PLT 140 (L) 02/10/2022  ? GLUCOSE 11

## 2022-03-01 DIAGNOSIS — M7062 Trochanteric bursitis, left hip: Secondary | ICD-10-CM | POA: Diagnosis not present

## 2022-03-10 ENCOUNTER — Emergency Department (HOSPITAL_BASED_OUTPATIENT_CLINIC_OR_DEPARTMENT_OTHER)
Admission: EM | Admit: 2022-03-10 | Discharge: 2022-03-10 | Disposition: A | Discharge status: Home / Self Care | Payer: PPO | Attending: Emergency Medicine | Admitting: Emergency Medicine

## 2022-03-10 ENCOUNTER — Other Ambulatory Visit: Payer: Self-pay

## 2022-03-10 ENCOUNTER — Emergency Department (HOSPITAL_BASED_OUTPATIENT_CLINIC_OR_DEPARTMENT_OTHER): Payer: PPO

## 2022-03-10 ENCOUNTER — Encounter (HOSPITAL_BASED_OUTPATIENT_CLINIC_OR_DEPARTMENT_OTHER): Payer: Self-pay | Admitting: Emergency Medicine

## 2022-03-10 DIAGNOSIS — Z7982 Long term (current) use of aspirin: Secondary | ICD-10-CM | POA: Diagnosis not present

## 2022-03-10 DIAGNOSIS — Y92000 Kitchen of unspecified non-institutional (private) residence as  the place of occurrence of the external cause: Secondary | ICD-10-CM | POA: Diagnosis not present

## 2022-03-10 DIAGNOSIS — S79911A Unspecified injury of right hip, initial encounter: Secondary | ICD-10-CM | POA: Diagnosis present

## 2022-03-10 DIAGNOSIS — Z043 Encounter for examination and observation following other accident: Secondary | ICD-10-CM | POA: Diagnosis not present

## 2022-03-10 DIAGNOSIS — R791 Abnormal coagulation profile: Secondary | ICD-10-CM | POA: Diagnosis not present

## 2022-03-10 DIAGNOSIS — S32591A Other specified fracture of right pubis, initial encounter for closed fracture: Secondary | ICD-10-CM | POA: Insufficient documentation

## 2022-03-10 DIAGNOSIS — M25551 Pain in right hip: Secondary | ICD-10-CM | POA: Diagnosis not present

## 2022-03-10 DIAGNOSIS — M19011 Primary osteoarthritis, right shoulder: Secondary | ICD-10-CM | POA: Diagnosis not present

## 2022-03-10 DIAGNOSIS — S32511A Fracture of superior rim of right pubis, initial encounter for closed fracture: Secondary | ICD-10-CM | POA: Diagnosis not present

## 2022-03-10 DIAGNOSIS — W19XXXA Unspecified fall, initial encounter: Secondary | ICD-10-CM | POA: Diagnosis not present

## 2022-03-10 DIAGNOSIS — S32591D Other specified fracture of right pubis, subsequent encounter for fracture with routine healing: Secondary | ICD-10-CM | POA: Insufficient documentation

## 2022-03-10 DIAGNOSIS — M47816 Spondylosis without myelopathy or radiculopathy, lumbar region: Secondary | ICD-10-CM | POA: Diagnosis not present

## 2022-03-10 DIAGNOSIS — M19012 Primary osteoarthritis, left shoulder: Secondary | ICD-10-CM | POA: Diagnosis not present

## 2022-03-10 DIAGNOSIS — S32501A Unspecified fracture of right pubis, initial encounter for closed fracture: Secondary | ICD-10-CM | POA: Diagnosis not present

## 2022-03-10 DIAGNOSIS — M4184 Other forms of scoliosis, thoracic region: Secondary | ICD-10-CM | POA: Diagnosis not present

## 2022-03-10 LAB — BASIC METABOLIC PANEL
Anion gap: 12 (ref 5–15)
BUN: 23 mg/dL (ref 8–23)
CO2: 23 mmol/L (ref 22–32)
Calcium: 9.5 mg/dL (ref 8.9–10.3)
Chloride: 101 mmol/L (ref 98–111)
Creatinine, Ser: 0.74 mg/dL (ref 0.44–1.00)
GFR, Estimated: >60 mL/min (ref >60)
Glucose, Bld: 115 mg/dL — ABNORMAL HIGH (ref 70–99)
Potassium: 3.4 mmol/L — ABNORMAL LOW (ref 3.5–5.1)
Sodium: 136 mmol/L (ref 135–145)

## 2022-03-10 LAB — CBC WITH DIFFERENTIAL/PLATELET
Abs Immature Granulocytes: 0.35 10*3/uL — ABNORMAL HIGH (ref 0.00–0.07)
Basophils Absolute: 0 10*3/uL (ref 0.0–0.1)
Basophils Relative: 0 %
Eosinophils Absolute: 0.1 10*3/uL (ref 0.0–0.5)
Eosinophils Relative: 0 %
HCT: 42.9 % (ref 36.0–46.0)
Hemoglobin: 14.5 g/dL (ref 12.0–15.0)
Immature Granulocytes: 2 %
Lymphocytes Relative: 12 %
Lymphs Abs: 2 10*3/uL (ref 0.7–4.0)
MCH: 29.7 pg (ref 26.0–34.0)
MCHC: 33.8 g/dL (ref 30.0–36.0)
MCV: 87.9 fL (ref 80.0–100.0)
Monocytes Absolute: 1.3 10*3/uL — ABNORMAL HIGH (ref 0.1–1.0)
Monocytes Relative: 8 %
Neutro Abs: 12.9 10*3/uL — ABNORMAL HIGH (ref 1.7–7.7)
Neutrophils Relative %: 78 %
Platelets: 269 10*3/uL (ref 150–400)
RBC: 4.88 MIL/uL (ref 3.87–5.11)
RDW: 13.7 % (ref 11.5–15.5)
WBC: 16.6 10*3/uL — ABNORMAL HIGH (ref 4.0–10.5)
nRBC: 0 % (ref 0.0–0.2)

## 2022-03-10 LAB — PROTIME-INR
INR: 1 (ref 0.8–1.2)
Prothrombin Time: 13.1 seconds (ref 11.4–15.2)

## 2022-03-10 MED ORDER — HYDROCODONE-ACETAMINOPHEN 5-325 MG PO TABS: 1.0000 | ORAL_TABLET | Freq: Four times a day (QID) | ORAL | 0 refills | Status: DC | PRN

## 2022-03-10 MED ORDER — HYDROCODONE-ACETAMINOPHEN 5-325 MG PO TABS
1.0000 | ORAL_TABLET | Freq: Once | ORAL | Status: AC
  Administered 2022-03-10: 1 via ORAL
  Filled 2022-03-10: qty 1

## 2022-03-10 NOTE — ED Triage Notes (Addendum)
Pt fell in kitchen around 1530, landing on RT side; c/o RT hip pain that is worsening; takes baby asa, did not hit head

## 2022-03-10 NOTE — Discharge Instructions (Addendum)
You were seen in the emergency department for evaluation of injuries from a fall.  You had x-rays of your pelvis and right hip and a CAT scan of your right hip that showed pelvic rami fractures.  These are usually treated nonoperatively.  We are prescribing you some pain medication.  Weightbearing as tolerated with walker.  Follow-up with Raliegh Ip.  Return to the emergency department if any worsening or concerning symptoms.

## 2022-03-10 NOTE — ED Provider Notes (Signed)
[facility] Provider Note   CSN: [id number] Arrival date & time: 03/10/22  2019     History  Chief Complaint  Patient presents with   Lytle Michaels    Kimberly Edwards is a 81 y.o. female.  She had a mechanical fall at home when she said her clothing got caught up against a cabinet and she went to ground injuring her back and legs.  She needed to enlist her neighbor to help her get up.  She has been using a walker but had difficulty getting up again to use the bathroom.  Pain is primarily in her right hip and groin.  No numbness or weakness.  Denies striking her head and does not have any neck pain.  She is on aspirin but no other anticoagulation.  No chest or abdominal pain.  The history is provided by the patient.  Fall This is a new problem. The current episode started 6 to 12 hours ago. The problem has not changed since onset.Pertinent negatives include no chest pain, no abdominal pain, no headaches and no shortness of breath. Associated symptoms comments: Right hip and groin pain. The symptoms are aggravated by bending, walking and standing. Nothing relieves the symptoms. She has tried rest for the symptoms. The treatment provided no relief.      Home Medications Prior to Admission medications   Medication Sig Start Date End Date Taking? Authorizing Provider  aspirin 81 MG tablet Take 81 mg by mouth daily.    [provider]  Calcium Carb-Cholecalciferol (CALCIUM 500 + D PO) Take 1 tablet by mouth in the morning and at bedtime.    [provider]  citalopram (CELEXA) 10 MG tablet Take 1 tablet (10 mg total) by mouth daily. 12/25/21   Roma Schanz R, DO  diltiazem (CARDIZEM CD) 300 MG 24 hr capsule TAKE ONE CAPSULE BY MOUTH BEFORE BREAKFAST 12/25/21   Carollee Herter, Alferd Apa, DO  fish oil-omega-3 fatty acids 1000 MG capsule Take 2 g by mouth daily.     [provider]  GLUCOSAMINE-CHONDROITIN PO Take 1 tablet by mouth daily. '1500mg'$   of glucosamine    [provider]  lisinopril (ZESTRIL) 30 MG tablet TAKE ONE TABLET BY MOUTH BEFORE BREAKFAST 12/25/21   Carollee Herter, Alferd Apa, DO  meloxicam (MOBIC) 15 MG tablet Take 1 tablet (15 mg total) by mouth daily. 12/25/21   Ann Held, DO  Multiple Vitamins-Minerals (MULTIVITAMIN WITH MINERALS) tablet Take 2 tablets by mouth daily. Vitafusion gummie    [provider]  Polyethyl Glycol-Propyl Glycol 0.4-0.3 % SOLN Apply 1 drop to eye daily.     [provider]  pravastatin (PRAVACHOL) 40 MG tablet TAKE ONE TABLET BY MOUTH BEFORE BREAKFAST 12/25/21   Carollee Herter, Alferd Apa, DO  triamterene-hydrochlorothiazide (DYAZIDE) 37.5-25 MG capsule TAKE ONE CAPSULE BY MOUTH BEFORE BREAKFAST 12/25/21   Ann Held, DO      Allergies    Oxycodone    Review of Systems   Review of Systems  Constitutional:  Negative for fever.  HENT:  Negative for sore throat.   Eyes:  Negative for visual disturbance.  Respiratory:  Negative for shortness of breath.   Cardiovascular:  Negative for chest pain.  Gastrointestinal:  Negative for abdominal pain.  Genitourinary:  Negative for dysuria.  Musculoskeletal:  Positive for back pain and gait problem.  Skin:  Negative for rash.  Neurological:  Negative for weakness, numbness and headaches.   Physical Exam  Updated Vital Signs BP (!) 170/68 (BP Location: Left Arm)   Pulse 69   Temp 98.2 F (36.8 C) (Oral)   Resp 18   Ht '4\' 10"'$  (1.473 m)   Wt 60.3 kg   SpO2 96%   BMI 27.80 kg/m  Physical Exam Vitals and nursing note reviewed.  Constitutional:      General: She is not in acute distress.    Appearance: Normal appearance. She is well-developed.  HENT:     Head: Normocephalic and atraumatic.  Eyes:     Conjunctiva/sclera: Conjunctivae normal.  Cardiovascular:     Rate and Rhythm: Normal rate and regular rhythm.     Heart sounds: No murmur heard. Pulmonary:     Effort: Pulmonary effort is normal. No  respiratory distress.     Breath sounds: Normal breath sounds.  Abdominal:     Palpations: Abdomen is soft.     Tenderness: There is no abdominal tenderness.  Musculoskeletal:        General: Tenderness present. No swelling or deformity. Normal range of motion.     Cervical back: Neck supple.     Right lower leg: No edema.     Left lower leg: No edema.     Comments: Full range of motion of all extremities upper and lower left and right.  She has some moderate pain with internal/external rotation and flexion of her right hip.  There is no shortening or rotation.  Knees and ankles bilaterally nontender.  Skin:    General: Skin is warm and dry.     Capillary Refill: Capillary refill takes less than 2 seconds.  Neurological:     General: No focal deficit present.     Mental Status: She is alert.     Sensory: No sensory deficit.     Motor: No weakness.    ED Results / Procedures / Treatments   Labs (all labs ordered are listed, but only abnormal results are displayed) Labs Reviewed  BASIC METABOLIC PANEL - Abnormal; Notable for the following components:      Result Value   Potassium 3.4 (*)    Glucose, Bld 115 (*)    All other components within normal limits  CBC WITH DIFFERENTIAL/PLATELET - Abnormal; Notable for the following components:   WBC 16.6 (*)    Neutro Abs 12.9 (*)    Monocytes Absolute 1.3 (*)    Abs Immature Granulocytes 0.35 (*)    All other components within normal limits  PROTIME-INR    EKG EKG Interpretation  Date/Time:  Sunday Mar 10 2022 21:10:53 EDT Ventricular Rate:  67 PR Interval:  170 QRS Duration: 102 QT Interval:  409 QTC Calculation: 432 R Axis:   -64 Text Interpretation: Sinus rhythm Left anterior fascicular block Abnormal R-wave progression, early transition ST elevation, consider inferior injury No old tracing to compare Confirmed by Aletta Edouard [id number]) on 03/10/2022 9:19:14 PM  Radiology DG Chest 1 View  Result Date:  03/10/2022 CLINICAL DATA:  Fall. EXAM: CHEST  1 VIEW COMPARISON:  Chest x-ray 05/30/2021 FINDINGS: The heart size and mediastinal contours are within normal limits. Both lungs are clear. Degenerative changes affect both shoulders. There is scoliosis of the thoracic spine with degenerative change. IMPRESSION: No active disease. Electronically Signed   By: Ronney Asters M.D.   On: 03/10/2022 21:51   CT Hip Right Wo Contrast  Result Date: 03/10/2022 CLINICAL DATA:  Hip trauma. Fracture suspected. Fell in kitchen around 6 hours prior, landing on right hip. Right  hip pain worsening. EXAM: CT OF THE RIGHT HIP WITHOUT CONTRAST TECHNIQUE: Multidetector CT imaging of the right hip was performed according to the standard protocol. Multiplanar CT image reconstructions were also generated. RADIATION DOSE REDUCTION: This exam was performed according to the departmental dose-optimization program which includes automated exposure control, adjustment of the mA and/or kV according to patient size and/or use of iterative reconstruction technique. COMPARISON:  Pelvis and right hip radiographs 03/10/2022 FINDINGS: Bones/Joint/Cartilage There is mild vertical oriented linear lucency and cortical step-off indicating an acute fracture of the left pubic body (coronal series 6, image 31). There is an oblique nondisplaced acute fracture of the right inferior pubic ramus (axial series 2 images 53 through 61). Moderate right femoroacetabular joint space narrowing, subchondral sclerosis, and subchondral degenerative cystic change. There is diffuse decreased bone mineralization. Ligaments Suboptimally assessed by CT. Muscles and Tendons There is moderate atrophy and fatty infiltration of the right gluteus minimus and medius muscles. Soft tissues Moderate to high-grade partially visualized sigmoid colon diverticulosis without inflammatory changes seen to indicate acute diverticulitis. IMPRESSION: 1. Acute nondisplaced fractures of the right  inferior pubic ramus and left pubic body. 2. Moderate right femoroacetabular osteoarthritis. Electronically Signed   By: Yvonne Kendall M.D.   On: 03/10/2022 21:49   DG Hip Unilat With Pelvis 2-3 Views Right  Result Date: 03/10/2022 CLINICAL DATA:  Fall, pain EXAM: DG HIP (WITH OR WITHOUT PELVIS) 2-3V RIGHT COMPARISON:  None Available. FINDINGS: Hip joints symmetric with early degenerative changes. SI joints symmetric. Degenerative changes in the visualized lower lumbar spine. Nondisplaced right inferior pubic ramus fracture. Possible nondisplaced left superior pubic ramus fracture. No proximal femoral fracture. No subluxation or dislocation. IMPRESSION: Right inferior pubic ramus fracture and possible left superior pubic ramus fracture. Electronically Signed   By: Rolm Baptise M.D.   On: 03/10/2022 21:08    Procedures Procedures    Medications Ordered in ED Medications  HYDROcodone-acetaminophen (NORCO/VICODIN) 5-325 MG per tablet 1 tablet (1 tablet Oral Given 03/10/22 2201)    ED Course/ Medical Decision Making/ A&P                           Medical Decision Making Amount and/or Complexity of Data Reviewed Labs: ordered. Radiology: ordered.  Risk Prescription drug management.   This patient complains of right hip pain after mechanical fall; this involves an extensive number of treatment Options and is a complaint that carries with it a high risk of complications and morbidity. The differential includes fracture, dislocation, contusion  I ordered, reviewed and interpreted labs, which included elevated white count possibly reactive, normal hemoglobin, chemistries fairly normal other than a mildly low potassium and elevated glucose, normal INR I ordered medication oral pain medicine with improvement in her symptoms and reviewed PMP when indicated. I ordered imaging studies which included x-rays of pelvis and right hip, CT right hip and I independently    visualized and interpreted  imaging which showed inferior pubic rami and pubis fractures Additional history obtained from patient's neighbor Previous records obtained and reviewed in epic no recent admissions Cardiac monitoring reviewed, normal sinus rhythm Social determinants considered, patient lives alone but is very active Critical Interventions: None  After the interventions stated above, I reevaluated the patient and found patient be symptomatically improved.  She has a walker already. Admission and further testing considered, no indications for admission at this time.  Will give prescription for pain medications and she already has an orthopedic team  to follow-up with.  Return instructions discussed.         Final Clinical Impression(s) / ED Diagnoses Final diagnoses:  Closed fracture of right inferior pubic ramus, initial encounter ([place])  Fall, initial encounter    Rx / DC Orders ED Discharge Orders          Ordered    HYDROcodone-acetaminophen (NORCO/VICODIN) 5-325 MG tablet  Every 6 hours PRN        03/10/22 2203              Hayden Rasmussen, MD 03/11/22 [phone]

## 2022-03-21 DIAGNOSIS — M7062 Trochanteric bursitis, left hip: Secondary | ICD-10-CM | POA: Diagnosis not present

## 2022-03-30 DIAGNOSIS — S32501D Unspecified fracture of right pubis, subsequent encounter for fracture with routine healing: Secondary | ICD-10-CM | POA: Diagnosis not present

## 2022-03-30 DIAGNOSIS — Z9181 History of falling: Secondary | ICD-10-CM | POA: Diagnosis not present

## 2022-03-30 DIAGNOSIS — Z7982 Long term (current) use of aspirin: Secondary | ICD-10-CM | POA: Diagnosis not present

## 2022-03-30 DIAGNOSIS — Z791 Long term (current) use of non-steroidal anti-inflammatories (NSAID): Secondary | ICD-10-CM | POA: Diagnosis not present

## 2022-03-30 DIAGNOSIS — K5900 Constipation, unspecified: Secondary | ICD-10-CM | POA: Diagnosis not present

## 2022-03-30 DIAGNOSIS — I10 Essential (primary) hypertension: Secondary | ICD-10-CM | POA: Diagnosis not present

## 2022-04-04 ENCOUNTER — Encounter: Payer: PPO | Admitting: Family Medicine

## 2022-04-15 DIAGNOSIS — I10 Essential (primary) hypertension: Secondary | ICD-10-CM | POA: Diagnosis not present

## 2022-04-15 DIAGNOSIS — Z9181 History of falling: Secondary | ICD-10-CM | POA: Diagnosis not present

## 2022-04-15 DIAGNOSIS — Z7982 Long term (current) use of aspirin: Secondary | ICD-10-CM | POA: Diagnosis not present

## 2022-04-15 DIAGNOSIS — K5900 Constipation, unspecified: Secondary | ICD-10-CM | POA: Diagnosis not present

## 2022-04-15 DIAGNOSIS — S32501D Unspecified fracture of right pubis, subsequent encounter for fracture with routine healing: Secondary | ICD-10-CM | POA: Diagnosis not present

## 2022-04-15 DIAGNOSIS — Z791 Long term (current) use of non-steroidal anti-inflammatories (NSAID): Secondary | ICD-10-CM | POA: Diagnosis not present

## 2022-04-18 ENCOUNTER — Telehealth: Payer: Self-pay | Admitting: Family Medicine

## 2022-04-18 DIAGNOSIS — M5416 Radiculopathy, lumbar region: Secondary | ICD-10-CM | POA: Diagnosis not present

## 2022-04-18 NOTE — Telephone Encounter (Signed)
Per [facility] pt needs a DEXA completed by 09/26/22. She fell and broke her tail bone in May and had to cx her CP. Needs CPE r/s in addition to an order for a DEXA placed please.

## 2022-04-22 DIAGNOSIS — M5416 Radiculopathy, lumbar region: Secondary | ICD-10-CM | POA: Diagnosis not present

## 2022-04-23 DIAGNOSIS — M25551 Pain in right hip: Secondary | ICD-10-CM | POA: Diagnosis not present

## 2022-04-24 NOTE — Telephone Encounter (Signed)
Pt called. LVM to return call to schedule CPE

## 2022-04-30 DIAGNOSIS — K5900 Constipation, unspecified: Secondary | ICD-10-CM | POA: Diagnosis not present

## 2022-04-30 DIAGNOSIS — Z791 Long term (current) use of non-steroidal anti-inflammatories (NSAID): Secondary | ICD-10-CM | POA: Diagnosis not present

## 2022-04-30 DIAGNOSIS — S32501D Unspecified fracture of right pubis, subsequent encounter for fracture with routine healing: Secondary | ICD-10-CM | POA: Diagnosis not present

## 2022-04-30 DIAGNOSIS — I10 Essential (primary) hypertension: Secondary | ICD-10-CM | POA: Diagnosis not present

## 2022-04-30 DIAGNOSIS — Z9181 History of falling: Secondary | ICD-10-CM | POA: Diagnosis not present

## 2022-04-30 DIAGNOSIS — Z7982 Long term (current) use of aspirin: Secondary | ICD-10-CM | POA: Diagnosis not present

## 2022-05-02 DIAGNOSIS — Z791 Long term (current) use of non-steroidal anti-inflammatories (NSAID): Secondary | ICD-10-CM | POA: Diagnosis not present

## 2022-05-02 DIAGNOSIS — S32501D Unspecified fracture of right pubis, subsequent encounter for fracture with routine healing: Secondary | ICD-10-CM | POA: Diagnosis not present

## 2022-05-02 DIAGNOSIS — Z7982 Long term (current) use of aspirin: Secondary | ICD-10-CM | POA: Diagnosis not present

## 2022-05-02 DIAGNOSIS — Z9181 History of falling: Secondary | ICD-10-CM | POA: Diagnosis not present

## 2022-05-02 DIAGNOSIS — K5900 Constipation, unspecified: Secondary | ICD-10-CM | POA: Diagnosis not present

## 2022-05-02 DIAGNOSIS — I10 Essential (primary) hypertension: Secondary | ICD-10-CM | POA: Diagnosis not present

## 2022-05-07 DIAGNOSIS — K5900 Constipation, unspecified: Secondary | ICD-10-CM | POA: Diagnosis not present

## 2022-05-07 DIAGNOSIS — Z7982 Long term (current) use of aspirin: Secondary | ICD-10-CM | POA: Diagnosis not present

## 2022-05-07 DIAGNOSIS — Z791 Long term (current) use of non-steroidal anti-inflammatories (NSAID): Secondary | ICD-10-CM | POA: Diagnosis not present

## 2022-05-07 DIAGNOSIS — Z9181 History of falling: Secondary | ICD-10-CM | POA: Diagnosis not present

## 2022-05-07 DIAGNOSIS — I10 Essential (primary) hypertension: Secondary | ICD-10-CM | POA: Diagnosis not present

## 2022-05-07 DIAGNOSIS — S32501D Unspecified fracture of right pubis, subsequent encounter for fracture with routine healing: Secondary | ICD-10-CM | POA: Diagnosis not present

## 2022-05-15 DIAGNOSIS — Z9181 History of falling: Secondary | ICD-10-CM | POA: Diagnosis not present

## 2022-05-15 DIAGNOSIS — Z7982 Long term (current) use of aspirin: Secondary | ICD-10-CM | POA: Diagnosis not present

## 2022-05-15 DIAGNOSIS — K5900 Constipation, unspecified: Secondary | ICD-10-CM | POA: Diagnosis not present

## 2022-05-15 DIAGNOSIS — Z791 Long term (current) use of non-steroidal anti-inflammatories (NSAID): Secondary | ICD-10-CM | POA: Diagnosis not present

## 2022-05-15 DIAGNOSIS — S32501D Unspecified fracture of right pubis, subsequent encounter for fracture with routine healing: Secondary | ICD-10-CM | POA: Diagnosis not present

## 2022-05-15 DIAGNOSIS — I10 Essential (primary) hypertension: Secondary | ICD-10-CM | POA: Diagnosis not present

## 2022-05-23 ENCOUNTER — Ambulatory Visit (INDEPENDENT_AMBULATORY_CARE_PROVIDER_SITE_OTHER): Payer: PPO | Admitting: Pharmacist

## 2022-05-23 DIAGNOSIS — M8580 Other specified disorders of bone density and structure, unspecified site: Secondary | ICD-10-CM

## 2022-05-23 DIAGNOSIS — E785 Hyperlipidemia, unspecified: Secondary | ICD-10-CM

## 2022-05-23 DIAGNOSIS — I10 Essential (primary) hypertension: Secondary | ICD-10-CM

## 2022-05-23 NOTE — Chronic Care Management (AMB) (Signed)
Chronic Care Management Pharmacy Note  05/23/2022 Name:  Kimberly Edwards MRN:  [id number] DOB:  08/03/1941  Summary:  Osteopenia: Patient had mechanical fall from standing position in home 03/10/2022. Presented to ED and noted to have right inferior pubic ramus fracture. Patient has been receiving physical therapy at home and using walker. No recent falls. She is transitioning over to using cane and will have final evaluation from PT next week. She is getting calcium in her daily MVI gummy which supplies about 160m in 2 gummies and eats 1 piece of cheese and 1 yogurt daily. Estimate total daily calicum intake is about 9066m Her MVI does have 1000 units of vitamin D.  Last Dexa was 09/25/2020 and noted to have low BMD with lowest T-Score of -2.1 at neck of femur. Frax score for hip was > 3% but patient declined pharmacotherapy in 2022.  Hypertension - home blood pressure and office blood pressure at goal. Continue current medications for hypertension  Hyperlipidemia: LDL at goal of < 100. Continue statin therapy  Reviewed medication list and updated - removed hydrocodone/acetaminophen as patient is no longer taking. Reviewed refill history. Noted to have good adherence for 2023.  Reminded patient to reschedule missed complete physical with PCP. Patient states she will call to rescheduled as soon as she is driving on her own again (once released by physical therapy / orthopedist)   Subjective: Kimberly Edwards an [age].o. year old female who is a primary patient of LoAnn HeldDO.  The CCM team was consulted for assistance with disease management and care coordination needs.    Engaged with patient by telephone for follow up visit in response to provider referral for pharmacy case management and/or care coordination services.   Consent to Services:  The patient was given information about Chronic Care Management services, agreed to services, and gave verbal consent prior to  initiation of services.  Please see initial visit note for detailed documentation.   Patient Care Team: LoCarollee HerterYvAlferd ApaDO as PCP - General (Family Medicine) GoJari PiggMD as Consulting Physician (Dermatology) WaElsie SaasMD as Consulting Physician (Orthopedic Surgery) DiCalvert CantorMD as Consulting Physician (Ophthalmology) HyGarrel RidgelDPM as Consulting Physician (Podiatry) EcCherre RobinsRPH-CPP (Pharmacist)  Recent office visits: 02/19/2022 - Fam Med (Dr LoCarollee HerterER follow up. Checked UA - noted only proteinuria.  12/25/2021 - Fam Med (Dr LoCarollee HerterFollow up chronic conditions. Labs checked. No med changes noted.   Recent consult visits: 03/01/2022 - Ortho (Dr IbRon AgeeSeen for Trochanteric bursitis of left hip. Bursa injected with triamcinolone.  12/06/2021 - Ortho Surgery (Dr VaGriffin Basilconsult for left shoulder pain / osteoarthritis  [facility]: [facility]. Pain in rigth hip and groin. Noted fracture of rigth inferior pubic ramus. Prescribed hydrocodone/APAP and recommended f/u with ortho. [facility]20[facility].GIven IM rocephin in ER and discharged with Rx for cephalexin 5001m times a day. Urine culture showed E.Coli that was sensitive to cephalosporins.   Objective:  Lab Results  Component Value Date   CREATININE 0.74 03/10/2022   CREATININE 0.89 02/10/2022   CREATININE 0.86 12/25/2021    Lab Results  Component Value Date   HGBA1C 5.6 06/08/2012   Last diabetic Eye exam: No results found for: "HMDIABEYEEXA"  Last diabetic Foot exam: No results found for: "HMDIABFOOTEX"      Component Value Date/Time  CHOL 184 12/25/2021 1009   TRIG 120.0 12/25/2021 1009   HDL 69.20 12/25/2021 1009   CHOLHDL 3 12/25/2021 1009   VLDL 24.0 12/25/2021 1009   LDLCALC 91 12/25/2021 1009   LDLCALC 92 06/23/2020 0926   LDLDIRECT 121.0 03/30/2015 1014        Latest Ref Rng & Units 02/10/2022   10:53 AM 12/25/2021   10:09 AM 07/03/2021   10:14 AM  Hepatic Function  Total Protein 6.5 - 8.1 g/dL 6.3  6.8  7.2   Albumin 3.5 - 5.0 g/dL 2.8  4.3  4.1   AST 15 - 41 U/L 22  15  14    ALT 0 - 44 U/L 17  9  10    Alk Phosphatase 38 - 126 U/L 63  68  61   Total Bilirubin 0.3 - 1.2 mg/dL 0.9  0.8  0.7     Lab Results  Component Value Date/Time   TSH 1.88 09/27/2014 09:51 AM   FREET4 0.93 09/27/2014 09:51 AM       Latest Ref Rng & Units 03/10/2022    8:40 PM 02/10/2022   10:53 AM 05/28/2021    4:12 PM  CBC  WBC 4.0 - 10.5 K/uL 16.6  8.2  18.8   Hemoglobin 12.0 - 15.0 g/dL 14.5  13.2  15.6   Hematocrit 36.0 - 46.0 % 42.9  38.8  44.7   Platelets 150 - 400 K/uL 269  140  318     No results found for: "VD25OH"  Clinical ASCVD: No  The ASCVD Risk score (Arnett DK, et al., 2019) failed to calculate for the following reasons:   The 2019 ASCVD risk score is only valid for ages 63 to 30     Social History   Tobacco Use  Smoking Status Former   Packs/day: 0.10   Years: 50.00   Total pack years: 5.00   Types: Cigarettes   Quit date: 03/23/2012   Years since quitting: 10.1  Smokeless Tobacco Never  Tobacco Comments   social and weekends alcohol   BP Readings from Last 3 Encounters:  03/10/22 138/72  02/19/22 138/88  02/10/22 (!) 153/82   Pulse Readings from Last 3 Encounters:  03/10/22 62  02/19/22 81  02/10/22 72   Wt Readings from Last 3 Encounters:  03/10/22 133 lb (60.3 kg)  02/19/22 134 lb 6.4 oz (61 kg)  02/10/22 135 lb (61.2 kg)    Assessment: Review of patient past medical history, allergies, medications, health status, including review of consultants reports, laboratory and other test data, was performed as part of comprehensive evaluation and provision of chronic care management services.   SDOH:  (Social Determinants of Health) assessments and interventions performed:  SDOH Interventions    Flowsheet Row Most  Recent Value  SDOH Interventions   Financial Strain Interventions Intervention Not Indicated  Physical Activity Interventions Other (Comments)  [planning to restart water aerobics - but has had pubic fracture and waiting on ortho to release her.]        CCM Care Plan  Allergies  Allergen Reactions   Oxycodone Nausea And Vomiting    Medications Reviewed Today     Reviewed by Cherre Robins, RPH-CPP (Pharmacist) on 05/23/22 at [phone]  Med List Status: <None>   Medication Order Taking? Sig Documenting Provider Last Dose Status Informant  aspirin 81 MG tablet [id number] Yes Take 81 mg by mouth daily. [provider] Taking Active Self  citalopram (CELEXA) 10 MG tablet [id number] Yes Take 1  tablet (10 mg total) by mouth daily. Carollee Herter, Kendrick Fries R, DO Taking Active   diltiazem (CARDIZEM CD) 300 MG 24 hr capsule [id number] Yes TAKE ONE CAPSULE BY MOUTH BEFORE BREAKFAST Ann Held, DO Taking Active   fish oil-omega-3 fatty acids 1000 MG capsule [id number] Yes Take 1 g by mouth daily. [provider] Taking Active   GLUCOSAMINE-CHONDROITIN PO [id number] Yes Take 1 tablet by mouth daily. 1549m of glucosamine [provider] Taking Active   lisinopril (ZESTRIL) 30 MG tablet [id number] TAKE ONE TABLET BY MOUTH BEFORE BREAKFAST LAnn Held DO Taking Active   meloxicam (MOBIC) 15 MG tablet [id number] Take 1 tablet (15 mg total) by mouth daily. LRoma SchanzR, DO Taking Active   Multiple Vitamins-Minerals (MULTIVITAMIN WITH MINERALS) tablet [id number] Take 2 tablets by mouth daily. Vitafusion gummie [provider] Taking Active Self           Med Note (Antony Contras Detrich Rakestraw B   Thu May 23, 2022  9:30 AM) MVI contains: 1000 units of vitamin D and 1662HUof calicum   Polyethyl Glycol-Propyl Glycol 0.4-0.3 % SOLN [id number] Apply 1 drop to eye daily.  [provider] Taking Active   pravastatin (PRAVACHOL) 40 MG tablet [id number]  TAKE ONE TABLET BY MOUTH BEFORE BREAKFAST LCarollee Herter YAlferd Apa DO Taking Active   triamterene-hydrochlorothiazide (DYAZIDE) 37.5-25 MG capsule [id number] TAKE ONE CAPSULE BY MOUTH BEFORE BREAKFAST LAnn Held DO Taking Active             Patient Active Problem List   Diagnosis Date Noted   Urinary tract infection with hematuria 02/19/2022   Preventative health care 04/02/2021   Bilateral hearing loss 06/23/2020   Estrogen deficiency 06/23/2020   Fatigue 12/22/2018   Generalized anxiety disorder 12/22/2018   Obesity (BMI 30-39.9) 07/01/2013   Dysuria 12/10/2012   Nausea and vomiting 03/25/2012   Lower urinary tract infectious disease 03/25/2012   Hypertension    Arthritis    Knee joint replacement status, left    Right knee DJD    HTN (hypertension) 12/09/2011   Hyperlipidemia 12/09/2011   Osteoarthritis 05/31/2011    Immunization History  Administered Date(s) Administered   Fluad Quad(high Dose 65+) 06/22/2019, 07/03/2021   Influenza, High Dose Seasonal PF 06/22/2014, 06/27/2015, 06/18/2016, 06/17/2017, 06/23/2018   Influenza,inj,Quad PF,6+ Mos 07/01/2013   Influenza-Unspecified 06/14/2014   PFIZER Comirnaty(Gray Top)Covid-19 Tri-Sucrose Vaccine 01/25/2021   PFIZER(Purple Top)SARS-COV-2 Vaccination 11/06/2019, 11/27/2019, 06/23/2020   Pfizer Covid-19 Vaccine Bivalent Booster 178yr& up 08/28/2021   Pneumococcal Conjugate-13 09/29/2014   Pneumococcal Polysaccharide-23 10/19/2015   Tdap 05/30/2021    Conditions to be addressed/monitored: HTN, HLD, and hip pain / bursitis; GAD; osteopenia  Care Plan : General Pharmacy (Adult)  Updates made by EcCherre RobinsRPH-CPP since 05/23/2022 12:00 AM     Problem: HTN, Osteopenia, HLD, GAD   Priority: High  Onset Date: 11/21/2020     Long-Range Goal: Provide education, support and care coordination for medication therapy and chronic conditions   Start Date: 11/21/2020  Expected End Date: 05/21/2021  Recent  Progress: On track  Priority: Medium  Note:   Current Barriers:  No therapy for bone health / osteopenia   Pharmacist Clinical Goal(s):  Over the next 180 days, patient will adhere to prescribed medication regimen as evidenced by starting Calcium + Vitamin D OTC to prevent progression to osteoporosis. through collaboration with PharmD and provider. (Completed) Continue to monitor blood pressure  regularly at home.  Interventions: 1:1 collaboration with Carollee Herter, Alferd Apa, DO regarding development and update of comprehensive plan of care as evidenced by provider attestation and co-signature Inter-disciplinary care team collaboration (see longitudinal plan of care) Comprehensive medication review performed; medication list updated in electronic medical record  Hypertension (BP goal <140/90) controlled Current treatment: Diltiazem 358m daily Lisinopril 351mdaily Triamterene-hydrochlorothiazide 37.5-25mg daily Medications previously tried: none noted Current home readings:  SBP: 135-148 DBP: 70-80 Current exercise habits: prior to recent fracture she was walking in pool 3 days per week for 45 minutes. Plans to restart once orthopedist approves.  Denies hypotensive/hypertensive symptoms Interventions:  Reviewed blood pressure goals and home blood pressure records Educated on daily salt intake goal < 2300 mg (addressed at previous visit)  Discussed exercise goal of 150 minutes per week; patient plans to restart exercise once approved by orthopedist.  Counseled to monitor blood pressure at home once per week, document, and provide log at future appointments Recommended to continue current medication  Hyperlipidemia: (LDL goal < 100) At goal Current treatment: Pravastatin 4023maily Medications previously tried: none noted Current exercise habits: see above Interventions:  Reviewed LDL goal and last lipid panel Recommended to continue current medication  Depression/Anxiety  (Goal: Minimize symptoms) Controlled Current treatment: Citalopram 45m24mdications previously tried/failed: none noted Interventions:  (addressed at previous visit) None today, addressed at previous visits Recommended to continue current medication   Osteopenia (Goal to maintain bone density, prevent fracture) Last DEXA Scan: 09/25/20 T-Score femoral neck: -2.1 T-Score forearm radius: -1.7 10-year probability of major osteoporotic fracture: 15.5% 10-year probability of hip fracture: 4.7% Patient is a candidate for pharmacologic treatment due to T-Score -1.0 to -2.5 and 10-year risk of hip fracture > 3% Patient declined pharmacotherapy for osteopenia 11/2020 Fractured pelvic bone - May 2023 after mechanical fall from standing Current treatment  Multivitamin with 130mg14mcium and 1000 unit of vitmamin D in 2 gummies Medications previously tried: none  Interventions:  Continue multivitamin and daily yogurt (estimate you are getting about 900mg 44malcium per day - goal is 1200mg p103may)  Add calcium supplement 500mg da30m  Recommend repeat bone density December 2023 Consider starting pharmacotherapy for bone health. Discussed with patient - she would like to repeat DEXA before considering medication.  Fall prevention discussed  Patient Goals/Self-Care Activities Over the next 180 days, patient will:  Take medications as prescribed check blood pressure weekly, document, and provide at future appointments Continue multivitamin and daily yogurt (estimate you are getting about 900mg of 30mium per day - goal is 1200mg per 65m  Add calcium supplement 500mg daily34mestart pool exercise when approved by orthopedist.   Follow Up Plan: The care management team will reach out to the patient again over the next 150 days.         Medication Assistance: None required.  Patient affirms current coverage meets needs.  Patient's preferred pharmacy is:  [facility] [place],[address] [place] [id number],[place] [address] [place] [phone]:  Patient agrees to Care Plan and Follow-up.  Plan: Telephone follow up appointment with care management team member scheduled for:  January 2024 - after completes DEXA.   Chelisa Hennen EckarCherre Robinsinical  Pharmacist [facility]

## 2022-05-23 NOTE — Patient Instructions (Signed)
Kimberly Edwards It was a pleasure speaking with you today.  Below is a summary of your health goals and summary of our recent visit. You can also view your updated Chronic Care Management Care plan through your MyChart account.   Patient Goals/Self-Care Activities Take medications as prescribed check blood pressure weekly, document, and provide at future appointments Continue multivitamin and daily yogurt (estimate you are getting about '900mg'$  of calcium per day - goal is '1200mg'$  per day)  Add calcium supplement '500mg'$  daily.  Restart pool exercise when approved by orthopedist. Get bone density rechecked 09/2022 - consider medication to help with bone strength / health.  As always if you have any questions or concerns especially regarding medications, please feel free to contact me either at the phone number below or with a MyChart message.   Keep up the good work!  Cherre Robins, PharmD Clinical Pharmacist [facility] [place] [facility] [phone] (direct line)  [phone] (main office number)   Patient verbalizes understanding of instructions and care plan provided today and agrees to view in [place]. Active MyChart status and patient understanding of how to access instructions and care plan via MyChart confirmed with patient.      Steps to Estimate Your Calcium Intake  The chart below will help you estimate the amount of calcium you get from food on a typical day. You'll also see how much more calcium you need to get each day from other food sources or supplements.  Calcium Calculator How to Estimate Your Daily Calcium Intake Step 1: Estimate the number of servings you have on a typical day for each type of food. One serving is equal to approximately:  8 oz. or one cup of milk 6 oz. of yogurt 1 oz. or 1 cubic inch of cheese The amount of calcium in fortified foods and juices ranges from 80 - 1,000 mg. Some examples are juices, soymilk and cereals.  Step 2: List the  estimated number of servings of each food item under "Servings Per Day."  Step 3: Multiply the number of "Servings Per Day" by the number of milligrams (mg) under "Calcium." For example: if you have about two servings of milk per day, multiply 2 x 300 to get a total of 600 mg of calcium from milk.  Step 4: After you have calculated the total amount of calcium for each product, add these totals in the right hand column to get your Total Daily Calcium Intake. Note: 250 mg of calcium is automatically added under "Estimated total from other foods." Most of [place] get about this amount of calcium each day from other foods like broccoli.  Step 5: Subtract your final total daily calcium intake from the recommended amount of calcium you need each day. This number is the additional calcium you need each day. You can get this additional calcium by adding calcium-rich foods to your diet and/or by taking a calcium supplement.

## 2022-06-06 DIAGNOSIS — M25511 Pain in right shoulder: Secondary | ICD-10-CM | POA: Diagnosis not present

## 2022-06-06 DIAGNOSIS — M25512 Pain in left shoulder: Secondary | ICD-10-CM | POA: Diagnosis not present

## 2022-06-10 DIAGNOSIS — M5416 Radiculopathy, lumbar region: Secondary | ICD-10-CM | POA: Diagnosis not present

## 2022-06-13 DIAGNOSIS — F32A Depression, unspecified: Secondary | ICD-10-CM | POA: Diagnosis not present

## 2022-06-13 DIAGNOSIS — E785 Hyperlipidemia, unspecified: Secondary | ICD-10-CM

## 2022-06-13 DIAGNOSIS — I10 Essential (primary) hypertension: Secondary | ICD-10-CM

## 2022-06-13 DIAGNOSIS — M8580 Other specified disorders of bone density and structure, unspecified site: Secondary | ICD-10-CM

## 2022-06-14 DIAGNOSIS — M25512 Pain in left shoulder: Secondary | ICD-10-CM | POA: Diagnosis not present

## 2022-06-21 ENCOUNTER — Emergency Department (HOSPITAL_BASED_OUTPATIENT_CLINIC_OR_DEPARTMENT_OTHER)
Admission: EM | Admit: 2022-06-21 | Discharge: 2022-06-21 | Disposition: A | Discharge status: Home / Self Care | Payer: PPO | Attending: Emergency Medicine | Admitting: Emergency Medicine

## 2022-06-21 ENCOUNTER — Encounter (HOSPITAL_BASED_OUTPATIENT_CLINIC_OR_DEPARTMENT_OTHER): Payer: Self-pay

## 2022-06-21 ENCOUNTER — Other Ambulatory Visit: Payer: Self-pay

## 2022-06-21 DIAGNOSIS — R21 Rash and other nonspecific skin eruption: Secondary | ICD-10-CM | POA: Insufficient documentation

## 2022-06-21 DIAGNOSIS — Z7982 Long term (current) use of aspirin: Secondary | ICD-10-CM | POA: Insufficient documentation

## 2022-06-21 MED ORDER — CEPHALEXIN 500 MG PO CAPS: 500.0000 mg | ORAL_CAPSULE | Freq: Two times a day (BID) | ORAL | 0 refills | Status: AC

## 2022-06-21 NOTE — Discharge Instructions (Addendum)
Please schedule follow-up appointment with your doctors office within a week.  Begin taking your antibiotics today and finish the full course.  If the redness spreading further outside the boundaries that were marked today, or you begin having fevers or shaking chills, please return to the ER, as this may be signs of a worsening infection.  We talked about the possibility this may be early shingles.  If the rash begins to blister, itch and burn, this could be a sign of shingles developing.  You should also call your doctors office as they may want to start you on medicine at that time.  Remember to keep the rash covered if it begins to blister, as shingles can be contagious.  Do not put any type of gel or or skin products over this wound at home.  You can wash with regular unscented soap and water.

## 2022-06-21 NOTE — ED Triage Notes (Signed)
Patient states she woke up this AM with redness noted to her left arm - Patient states it is warm and painful to touch.   Site marked in triage.

## 2022-06-21 NOTE — ED Provider Notes (Signed)
[facility] Provider Note   CSN: [id number] Arrival date & time: 06/21/22  1011     History  Chief Complaint  Patient presents with   Rash    Kimberly Edwards is a 81 y.o. female presenting to ED with a painless red rash on her left lateral arm.  She reports she has had "arthritis" in her left shoulder and been applying Voltaren gel to the posterior shoulder.  However on the left upper arm she noticed this morning that she had a red rash when she was getting out of the shower.  There are some swelling around this.  She denies any pain, itching or blistering.  Denies any recent injections or injuries to the site.  Denies any fevers or chills.  Denies any prior history of shingles  HPI     Home Medications Prior to Admission medications   Medication Sig Start Date End Date Taking? Authorizing Provider  cephALEXin (KEFLEX) 500 MG capsule Take 1 capsule (500 mg total) by mouth 2 (two) times daily for 7 days. 06/21/22 06/28/22 Yes [place] Agent, Carola Rhine, MD  aspirin 81 MG tablet Take 81 mg by mouth daily.    [provider]  citalopram (CELEXA) 10 MG tablet Take 1 tablet (10 mg total) by mouth daily. 12/25/21   Roma Schanz R, DO  diltiazem (CARDIZEM CD) 300 MG 24 hr capsule TAKE ONE CAPSULE BY MOUTH BEFORE BREAKFAST 12/25/21   Carollee Herter, Alferd Apa, DO  fish oil-omega-3 fatty acids 1000 MG capsule Take 1 g by mouth daily.    [provider]  GLUCOSAMINE-CHONDROITIN PO Take 1 tablet by mouth daily. '1500mg'$  of glucosamine    [provider]  lisinopril (ZESTRIL) 30 MG tablet TAKE ONE TABLET BY MOUTH BEFORE BREAKFAST 12/25/21   Carollee Herter, Alferd Apa, DO  meloxicam (MOBIC) 15 MG tablet Take 1 tablet (15 mg total) by mouth daily. 12/25/21   Ann Held, DO  Multiple Vitamins-Minerals (MULTIVITAMIN WITH MINERALS) tablet Take 2 tablets by mouth daily. Vitafusion gummie    [provider]  Polyethyl Glycol-Propyl Glycol 0.4-0.3  % SOLN Apply 1 drop to eye daily.     [provider]  pravastatin (PRAVACHOL) 40 MG tablet TAKE ONE TABLET BY MOUTH BEFORE BREAKFAST 12/25/21   Carollee Herter, Alferd Apa, DO  triamterene-hydrochlorothiazide (DYAZIDE) 37.5-25 MG capsule TAKE ONE CAPSULE BY MOUTH BEFORE BREAKFAST 12/25/21   Ann Held, DO      Allergies    Oxycodone    Review of Systems   Review of Systems  Physical Exam Updated Vital Signs BP (!) 182/90   Pulse 77   Temp 98.3 F (36.8 C) (Oral)   Resp 16   Ht '4\' 10"'$  (1.473 m)   Wt 60.8 kg   SpO2 98%   BMI 28.01 kg/m  Physical Exam Constitutional:      General: She is not in acute distress. HENT:     Head: Normocephalic and atraumatic.  Eyes:     Conjunctiva/sclera: Conjunctivae normal.     Pupils: Pupils are equal, round, and reactive to light.  Cardiovascular:     Rate and Rhythm: Normal rate and regular rhythm.  Pulmonary:     Effort: Pulmonary effort is normal. No respiratory distress.  Abdominal:     General: There is no distension.     Tenderness: There is no abdominal tenderness.  Skin:    General: Skin is warm and dry.     Comments: There  is rash with mild streaking of the left upper arm see photo, no underlying blistering or fluctuance, no tenderness or swelling along the medial aspect of the arm  Neurological:     General: No focal deficit present.     Mental Status: She is alert. Mental status is at baseline.  Psychiatric:        Mood and Affect: Mood normal.        Behavior: Behavior normal.      ED Results / Procedures / Treatments   Labs (all labs ordered are listed, but only abnormal results are displayed) Labs Reviewed - No data to display  EKG None  Radiology No results found.  Procedures Procedures    Medications Ordered in ED Medications - No data to display  ED Course/ Medical Decision Making/ A&P                           Medical Decision Making Risk Prescription drug management.   Patient  is here with painless redness and swelling of the left lateral arm as photographed above.  Differential includes cellulitis versus early shingles versus topical reaction to Voltaren gel or other medication, versus other.  I think is reasonable to treat with cephalexin based on this appearance, mild streaking.  The borders have been to marked.  She will pick up this antibiotic today immediately upon leaving and begin taking it at home.  Return precautions were discussed if the redness were to continue to increase over the next 24 to 48 hours, or if she begins having fevers.  We also discussed the possibility this may be early shingles, and if she begins developing itching, burning, or blistering, to contact her PCPs office at that point, and also to keep the wound covered up while blistering.  She has a PCP appointment arranged in a week.  Finally if she is failing to improve, her doctor's office could consider DVT study, although the location of this lesion is not along the DVT system.  Superficial thrombophlebitis would be an alternative possibility.  Do not believe she needs emergent DVT imaging at this time.        Final Clinical Impression(s) / ED Diagnoses Final diagnoses:  Rash    Rx / DC Orders ED Discharge Orders          Ordered    cephALEXin (KEFLEX) 500 MG capsule  2 times daily        06/21/22 1040              Shealynn Saulnier, Carola Rhine, MD 06/21/22 1041

## 2022-06-27 ENCOUNTER — Encounter: Payer: Self-pay | Admitting: Family Medicine

## 2022-06-27 ENCOUNTER — Ambulatory Visit (INDEPENDENT_AMBULATORY_CARE_PROVIDER_SITE_OTHER): Payer: PPO | Admitting: Family Medicine

## 2022-06-27 VITALS — BP 138/90 | HR 72 | Temp 97.4°F | Resp 18 | Ht <= 58 in | Wt 135.6 lb

## 2022-06-27 DIAGNOSIS — E782 Mixed hyperlipidemia: Secondary | ICD-10-CM | POA: Diagnosis not present

## 2022-06-27 DIAGNOSIS — E785 Hyperlipidemia, unspecified: Secondary | ICD-10-CM | POA: Diagnosis not present

## 2022-06-27 DIAGNOSIS — M1990 Unspecified osteoarthritis, unspecified site: Secondary | ICD-10-CM | POA: Diagnosis not present

## 2022-06-27 DIAGNOSIS — Z23 Encounter for immunization: Secondary | ICD-10-CM

## 2022-06-27 DIAGNOSIS — I10 Essential (primary) hypertension: Secondary | ICD-10-CM

## 2022-06-27 DIAGNOSIS — F411 Generalized anxiety disorder: Secondary | ICD-10-CM

## 2022-06-27 LAB — COMPREHENSIVE METABOLIC PANEL
ALT: 16 U/L (ref 0–35)
AST: 15 U/L (ref 0–37)
Albumin: 3.9 g/dL (ref 3.5–5.2)
Alkaline Phosphatase: 77 U/L (ref 39–117)
BUN: 20 mg/dL (ref 6–23)
CO2: 28 mEq/L (ref 19–32)
Calcium: 9.5 mg/dL (ref 8.4–10.5)
Chloride: 101 mEq/L (ref 96–112)
Creatinine, Ser: 0.73 mg/dL (ref 0.40–1.20)
GFR: 76.2 mL/min (ref >60.00)
Glucose, Bld: 81 mg/dL (ref 70–99)
Potassium: 4.4 mEq/L (ref 3.5–5.1)
Sodium: 137 mEq/L (ref 135–145)
Total Bilirubin: 0.8 mg/dL (ref 0.2–1.2)
Total Protein: 6.7 g/dL (ref 6.0–8.3)

## 2022-06-27 LAB — LIPID PANEL
Cholesterol: 167 mg/dL (ref 0–200)
HDL: 77 mg/dL (ref >39.00)
LDL Cholesterol: 70 mg/dL (ref 0–99)
NonHDL: 89.82
Total CHOL/HDL Ratio: 2
Triglycerides: 101 mg/dL (ref 0.0–149.0)
VLDL: 20.2 mg/dL (ref 0.0–40.0)

## 2022-06-27 MED ORDER — MELOXICAM 15 MG PO TABS: 15.0000 mg | ORAL_TABLET | Freq: Every day | ORAL | 1 refills | Status: DC

## 2022-06-27 MED ORDER — PRAVASTATIN SODIUM 40 MG PO TABS: ORAL_TABLET | ORAL | 1 refills | Status: DC

## 2022-06-27 MED ORDER — CITALOPRAM HYDROBROMIDE 10 MG PO TABS: 10.0000 mg | ORAL_TABLET | Freq: Every day | ORAL | 3 refills | Status: DC

## 2022-06-27 MED ORDER — DILTIAZEM HCL ER COATED BEADS 300 MG PO CP24: ORAL_CAPSULE | ORAL | 1 refills | Status: DC

## 2022-06-27 MED ORDER — LISINOPRIL 30 MG PO TABS: ORAL_TABLET | ORAL | 1 refills | Status: DC

## 2022-06-27 MED ORDER — TRIAMTERENE-HCTZ 37.5-25 MG PO CAPS: ORAL_CAPSULE | ORAL | 1 refills | Status: DC

## 2022-06-27 NOTE — Assessment & Plan Note (Signed)
Well controlled, no changes to meds. Encouraged heart healthy diet such as the DASH diet and exercise as tolerated.  °

## 2022-06-27 NOTE — Progress Notes (Signed)
Established Patient Office Visit  Subjective   Patient ID: Kimberly Edwards, female    DOB: [birth year]  Age: 81 y.o. MRN: [id number]  Chief Complaint  Patient presents with   Hypertension   Hyperlipidemia   Follow-up    HPI Pt here f/u bp and cholesterol.  No new complaints.    Patient Active Problem List   Diagnosis Date Noted   Urinary tract infection with hematuria 02/19/2022   Preventative health care 04/02/2021   Bilateral hearing loss 06/23/2020   Estrogen deficiency 06/23/2020   Fatigue 12/22/2018   Generalized anxiety disorder 12/22/2018   Obesity (BMI 30-39.9) 07/01/2013   Dysuria 12/10/2012   Nausea and vomiting 03/25/2012   Lower urinary tract infectious disease 03/25/2012   Hypertension    Arthritis    Knee joint replacement status, left    Right knee DJD    HTN (hypertension) 12/09/2011   Hyperlipidemia 12/09/2011   Osteoarthritis 05/31/2011   Past Medical History:  Diagnosis Date   Arthritis    Depression    Heart murmur    Hyperlipidemia    Hypertension    Knee joint replacement status, left 2003   PONV (postoperative nausea and vomiting)    Right knee DJD    Past Surgical History:  Procedure Laterality Date   BREAST BIOPSY  2000   marker put in   [place]     left knee   TONSILLECTOMY     TOTAL KNEE ARTHROPLASTY  03/23/2012   Procedure: TOTAL KNEE ARTHROPLASTY;  Surgeon: Lorn Junes, MD;  Location: [facility];  Service: Orthopedics;  Laterality: Right;  right total knee arthroplasty   Social History   Tobacco Use   Smoking status: Former    Packs/day: 0.10    Years: 50.00    Total pack years: 5.00    Types: Cigarettes    Quit date: 03/23/2012    Years since quitting: 10.2   Smokeless tobacco: Never   Tobacco comments:    social and weekends alcohol  Vaping Use   Vaping Use: Never used  Substance Use Topics   Alcohol use: Yes    Comment: Wine on weekends   Drug use: No    Social History   Socioeconomic History   Marital status: Widowed    Spouse name: Not on file   Number of children: Not on file   Years of education: Not on file   Highest education level: Not on file  Occupational History   Occupation: retired Theme park manager  Tobacco Use   Smoking status: Former    Packs/day: 0.10    Years: 50.00    Total pack years: 5.00    Types: Cigarettes    Quit date: 03/23/2012    Years since quitting: 10.2   Smokeless tobacco: Never   Tobacco comments:    social and weekends alcohol  Vaping Use   Vaping Use: Never used  Substance and Sexual Activity   Alcohol use: Yes    Comment: Wine on weekends   Drug use: No   Sexual activity: Not Currently    Partners: Male    Birth control/protection: Post-menopausal  Other Topics Concern   Not on file  Social History Narrative   Regular exercise: walk 2 times a week   Caffeine use: 2 cups of coffee daily         Social Determinants of Health   Financial Resource Strain: Low Risk  (05/23/2022)  Overall Financial Resource Strain (CARDIA)    Difficulty of Paying Living Expenses: Not hard at all  Food Insecurity: No Food Insecurity (09/18/2021)   Hunger Vital Sign    Worried About Running Out of Food in the Last Year: Never true    Ran Out of Food in the Last Year: Never true  Transportation Needs: No Transportation Needs (09/18/2021)   PRAPARE - Hydrologist (Medical): No    Lack of Transportation (Non-Medical): No  Physical Activity: Insufficiently Active (05/23/2022)   Exercise Vital Sign    Days of Exercise per Week: 1 day    Minutes of Exercise per Session: 40 min  Stress: No Stress Concern Present (09/18/2021)   [place]    Feeling of Stress : Not at all  Social Connections: Moderately Integrated (09/18/2021)   Social Connection and Isolation Panel [NHANES]    Frequency of Communication with Friends and  Family: More than three times a week    Frequency of Social Gatherings with Friends and Family: More than three times a week    Attends Religious Services: More than 4 times per year    Active Member of [employer] or Organizations: Yes    Attends Archivist Meetings: More than 4 times per year    Marital Status: Widowed  Intimate Partner Violence: Not At Risk (09/18/2021)   Humiliation, Afraid, Rape, and Kick questionnaire    Fear of Current or Ex-Partner: No    Emotionally Abused: No    Physically Abused: No    Sexually Abused: No   Family Status  Relation Name Status   Mother  Deceased   Father  Deceased   Sister  Alive   Brother  Alive   Sister  (Not Specified)   Family History  Problem Relation Age of Onset   Hypertension Father    Breast cancer Sister    Allergies  Allergen Reactions   Oxycodone Nausea And Vomiting      Review of Systems  Constitutional:  Negative for fever and malaise/fatigue.  HENT:  Negative for congestion.   Eyes:  Negative for blurred vision.  Respiratory:  Negative for shortness of breath.   Cardiovascular:  Negative for chest pain, palpitations and leg swelling.  Gastrointestinal:  Negative for abdominal pain, blood in stool and nausea.  Genitourinary:  Negative for dysuria and frequency.  Musculoskeletal:  Positive for joint pain and myalgias. Negative for falls.  Skin:  Negative for rash.  Neurological:  Negative for dizziness, loss of consciousness and headaches.  Endo/Heme/Allergies:  Negative for environmental allergies.  Psychiatric/Behavioral:  Negative for depression. The patient is not nervous/anxious.       Objective:     BP (!) 138/90 (BP Location: Right Arm, Patient Position: Sitting, Cuff Size: Normal)   Pulse 72   Temp (!) 97.4 F (36.3 C) (Oral)   Resp 18   Ht '4\' 10"'$  (1.473 m)   Wt 135 lb 9.6 oz (61.5 kg)   SpO2 98%   BMI 28.34 kg/m  BP Readings from Last 3 Encounters:  06/27/22 (!) 138/90  06/21/22 (!)  182/90  03/10/22 138/72   Wt Readings from Last 3 Encounters:  06/27/22 135 lb 9.6 oz (61.5 kg)  06/21/22 134 lb (60.8 kg)  03/10/22 133 lb (60.3 kg)   SpO2 Readings from Last 3 Encounters:  06/27/22 98%  06/21/22 98%  03/10/22 99%      Physical Exam Vitals and  nursing note reviewed.  Constitutional:      Appearance: She is well-developed.  HENT:     Head: Normocephalic and atraumatic.  Eyes:     Conjunctiva/sclera: Conjunctivae normal.  Neck:     Thyroid: No thyromegaly.     Vascular: No carotid bruit or JVD.  Cardiovascular:     Rate and Rhythm: Normal rate and regular rhythm.     Heart sounds: Normal heart sounds. No murmur heard. Pulmonary:     Effort: Pulmonary effort is normal. No respiratory distress.     Breath sounds: Normal breath sounds. No wheezing or rales.  Chest:     Chest wall: No tenderness.  Musculoskeletal:     Cervical back: Normal range of motion and neck supple.  Neurological:     Mental Status: She is alert and oriented to person, place, and time.      No results found for any visits on 06/27/22.  Last CBC Lab Results  Component Value Date   WBC 16.6 (H) 03/10/2022   HGB 14.5 03/10/2022   HCT 42.9 03/10/2022   MCV 87.9 03/10/2022   MCH 29.7 03/10/2022   RDW 13.7 03/10/2022   PLT 269 33/35/4562   Last metabolic panel Lab Results  Component Value Date   GLUCOSE 115 (H) 03/10/2022   NA 136 03/10/2022   K 3.4 (L) 03/10/2022   CL 101 03/10/2022   CO2 23 03/10/2022   BUN 23 03/10/2022   CREATININE 0.74 03/10/2022   GFRNONAA >60 03/10/2022   CALCIUM 9.5 03/10/2022   PROT 6.3 (L) 02/10/2022   ALBUMIN 2.8 (L) 02/10/2022   BILITOT 0.9 02/10/2022   ALKPHOS 63 02/10/2022   AST 22 02/10/2022   ALT 17 02/10/2022   ANIONGAP 12 03/10/2022   Last lipids Lab Results  Component Value Date   CHOL 184 12/25/2021   HDL 69.20 12/25/2021   LDLCALC 91 12/25/2021   LDLDIRECT 121.0 03/30/2015   TRIG 120.0 12/25/2021   CHOLHDL 3 12/25/2021    Last hemoglobin A1c Lab Results  Component Value Date   HGBA1C 5.6 06/08/2012   Last thyroid functions Lab Results  Component Value Date   TSH 1.88 09/27/2014   Last vitamin D No results found for: "25OHVITD2", "25OHVITD3", "VD25OH" Last vitamin B12 and Folate No results found for: "VITAMINB12", "FOLATE"    The ASCVD Risk score (Arnett DK, et al., 2019) failed to calculate for the following reasons:   The 2019 ASCVD risk score is only valid for ages 24 to 84    Assessment & Plan:   Problem List Items Addressed This Visit       Unprioritized   Arthritis   Relevant Medications   meloxicam (MOBIC) 15 MG tablet   HTN (hypertension)   Relevant Medications   diltiazem (CARDIZEM CD) 300 MG 24 hr capsule   lisinopril (ZESTRIL) 30 MG tablet   pravastatin (PRAVACHOL) 40 MG tablet   triamterene-hydrochlorothiazide (DYAZIDE) 37.5-25 MG capsule   Other Relevant Orders   Comprehensive metabolic panel   Lipid panel   Generalized anxiety disorder   Relevant Medications   citalopram (CELEXA) 10 MG tablet   Hyperlipidemia - Primary    Encourage heart healthy diet such as MIND or DASH diet, increase exercise, avoid trans fats, simple carbohydrates and processed foods, consider a krill or fish or flaxseed oil cap daily.       Relevant Medications   diltiazem (CARDIZEM CD) 300 MG 24 hr capsule   lisinopril (ZESTRIL) 30 MG tablet   pravastatin (  PRAVACHOL) 40 MG tablet   triamterene-hydrochlorothiazide (DYAZIDE) 37.5-25 MG capsule   Other Relevant Orders   Comprehensive metabolic panel   Lipid panel   Other Visit Diagnoses     Essential hypertension       Relevant Medications   diltiazem (CARDIZEM CD) 300 MG 24 hr capsule   lisinopril (ZESTRIL) 30 MG tablet   pravastatin (PRAVACHOL) 40 MG tablet   triamterene-hydrochlorothiazide (DYAZIDE) 37.5-25 MG capsule   Need for influenza vaccination       Relevant Orders   Flu Vaccine QUAD High Dose(Fluad) (Completed)        Return in about 6 months (around 12/26/2022), or if symptoms worsen or fail to improve.    Ann Held, DO

## 2022-06-27 NOTE — Patient Instructions (Signed)

## 2022-06-27 NOTE — Assessment & Plan Note (Signed)
Encourage heart healthy diet such as MIND or DASH diet, increase exercise, avoid trans fats, simple carbohydrates and processed foods, consider a krill or fish or flaxseed oil cap daily.  °

## 2022-07-20 ENCOUNTER — Emergency Department (HOSPITAL_BASED_OUTPATIENT_CLINIC_OR_DEPARTMENT_OTHER): Payer: PPO

## 2022-07-20 ENCOUNTER — Encounter (HOSPITAL_BASED_OUTPATIENT_CLINIC_OR_DEPARTMENT_OTHER): Payer: Self-pay | Admitting: Emergency Medicine

## 2022-07-20 ENCOUNTER — Other Ambulatory Visit: Payer: Self-pay

## 2022-07-20 ENCOUNTER — Emergency Department (HOSPITAL_BASED_OUTPATIENT_CLINIC_OR_DEPARTMENT_OTHER)
Admission: EM | Admit: 2022-07-20 | Discharge: 2022-07-20 | Disposition: A | Discharge status: Home / Self Care | Payer: PPO | Attending: Emergency Medicine | Admitting: Emergency Medicine

## 2022-07-20 DIAGNOSIS — M19012 Primary osteoarthritis, left shoulder: Secondary | ICD-10-CM | POA: Diagnosis not present

## 2022-07-20 DIAGNOSIS — R21 Rash and other nonspecific skin eruption: Secondary | ICD-10-CM | POA: Insufficient documentation

## 2022-07-20 DIAGNOSIS — Z7982 Long term (current) use of aspirin: Secondary | ICD-10-CM | POA: Diagnosis not present

## 2022-07-20 DIAGNOSIS — M7989 Other specified soft tissue disorders: Secondary | ICD-10-CM | POA: Diagnosis not present

## 2022-07-20 MED ORDER — CEPHALEXIN 500 MG PO CAPS: 500.0000 mg | ORAL_CAPSULE | Freq: Four times a day (QID) | ORAL | 0 refills | Status: DC

## 2022-07-20 NOTE — ED Triage Notes (Signed)
Pt arrives pov, slow gait with cane, c/o rash on LT shoulder this am upon waking. Swelling noted, Pt denies injury or fever

## 2022-07-20 NOTE — ED Provider Notes (Signed)
[facility] Provider Note   CSN: [id number] Arrival date & time: 07/20/22  0944     History  Chief Complaint  Patient presents with   Rash    Kimberly Edwards is a 81 y.o. female.  Patient presents to the emergency department for evaluation of recurrent redness on the left shoulder and upper arm area.  She had an emergency department visit 1 month ago for similar appearing area which improved after a couple of days with Keflex.  She followed up with PCP, but area had resolved, and there was no additional follow-up or treatment.  Symptoms recurred today, higher up on the arm and shoulder.  Patient has shoulder pain but states that this is typical for her arthritis.  No fevers, nausea or vomiting.  The rash is not itchy or painful.  She states that it feels slightly warm to her and slightly swollen.  She denies injuries.       Home Medications Prior to Admission medications   Medication Sig Start Date End Date Taking? Authorizing Provider  cephALEXin (KEFLEX) 500 MG capsule Take 1 capsule (500 mg total) by mouth 4 (four) times daily. 07/20/22  Yes Carlisle Cater, PA-C  aspirin 81 MG tablet Take 81 mg by mouth daily.    [provider]  citalopram (CELEXA) 10 MG tablet Take 1 tablet (10 mg total) by mouth daily. 06/27/22   Roma Schanz R, DO  diltiazem (CARDIZEM CD) 300 MG 24 hr capsule TAKE ONE CAPSULE BY MOUTH BEFORE BREAKFAST 06/27/22   Carollee Herter, Alferd Apa, DO  fish oil-omega-3 fatty acids 1000 MG capsule Take 1 g by mouth daily.    [provider]  GLUCOSAMINE-CHONDROITIN PO Take 1 tablet by mouth daily. '1500mg'$  of glucosamine    [provider]  lisinopril (ZESTRIL) 30 MG tablet TAKE ONE TABLET BY MOUTH BEFORE BREAKFAST 06/27/22   Carollee Herter, Alferd Apa, DO  meloxicam (MOBIC) 15 MG tablet Take 1 tablet (15 mg total) by mouth daily. 06/27/22   Ann Held, DO  Multiple Vitamins-Minerals (MULTIVITAMIN WITH MINERALS)  tablet Take 2 tablets by mouth daily. Vitafusion gummie    [provider]  Polyethyl Glycol-Propyl Glycol 0.4-0.3 % SOLN Apply 1 drop to eye daily.     [provider]  pravastatin (PRAVACHOL) 40 MG tablet TAKE ONE TABLET BY MOUTH BEFORE BREAKFAST 06/27/22   Carollee Herter, Alferd Apa, DO  triamterene-hydrochlorothiazide (DYAZIDE) 37.5-25 MG capsule TAKE ONE CAPSULE BY MOUTH BEFORE BREAKFAST 06/27/22   Ann Held, DO      Allergies    Oxycodone    Review of Systems   Review of Systems  Physical Exam Updated Vital Signs BP (!) 182/80 (BP Location: Right Arm)   Pulse 64   Temp 97.9 F (36.6 C) (Oral)   Resp 16   Ht '4\' 10"'$  (1.473 m)   Wt 62.1 kg   SpO2 95%   BMI 28.63 kg/m  Physical Exam Vitals and nursing note reviewed.  Constitutional:      Appearance: She is well-developed.  HENT:     Head: Normocephalic and atraumatic.  Eyes:     Conjunctiva/sclera: Conjunctivae normal.  Pulmonary:     Effort: No respiratory distress.  Musculoskeletal:     Cervical back: Normal range of motion and neck supple.     Comments: Patient with a well demarcated erythematous area to the left deltoid area.  Minimal induration and warmth.  No tenderness or excoriations.  No  signs of lymphangitis or abscess.  Skin:    General: Skin is warm and dry.  Neurological:     Mental Status: She is alert.     ED Results / Procedures / Treatments   Labs (all labs ordered are listed, but only abnormal results are displayed) Labs Reviewed - No data to display  EKG None  Radiology DG Shoulder Left  Result Date: 07/20/2022 CLINICAL DATA:  Redness and swelling, left shoulder pain EXAM: LEFT SHOULDER - 3 VIEW COMPARISON:  Left shoulder CT November 30, 2021 FINDINGS: Severe glenohumeral and acromioclavicular osteophytosis, joint space loss, and subchondral sclerosis. No evidence of osseous erosion. Significant overlying soft tissue and intramuscular edema. The visualized left lung is  clear. IMPRESSION: 1. 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 2. Significant overlying soft tissue and intramuscular edema. Electronically Signed   By: Beryle Flock M.D.   On: 07/20/2022 10:44    Procedures Procedures    Medications Ordered in ED Medications - No data to display  ED Course/ Medical Decision Making/ A&P    Patient seen and examined. History obtained directly from patient. Work-up including labs, imaging, EKG ordered in triage, if performed, were reviewed.    Labs/EKG: None ordered.  Imaging: Independently reviewed and interpreted.  This included: X-ray of the shoulder, arthritic changes noted.  Medications/Fluids: None ordered.  Most recent vital signs reviewed and are as follows: BP (!) 182/80 (BP Location: Right Arm)   Pulse 64   Temp 97.9 F (36.6 C) (Oral)   Resp 16   Ht '4\' 10"'$  (1.473 m)   Wt 62.1 kg   SpO2 95%   BMI 28.63 kg/m   Initial impression: Nonspecific skin eruption, possible erysipelas.  Less likely shingles, contact dermatitis, abscess.  H&P, work-up, and plan reviewed with: Dr. Maylon Peppers  Home treatment plan: Oral Keflex  Return instructions discussed with patient: Pt urged to return with worsening pain, worsening swelling, expanding area of redness or streaking up extremity, fever, or any other concerns.   Follow-up instructions discussed with patient: PCP/dermatologist in 1 week                          Medical Decision Making Amount and/or Complexity of Data Reviewed Radiology: ordered.  Risk Prescription drug management.   Patient with nonspecific skin rash as above.  No significant systemic symptoms of illness or sepsis.  No signs of abscess.  Responded last time to Keflex.  May benefit from dermatology evaluation.  No indication for admission today.  No concern for gout, inflammatory arthritis, septic joint.  The patient's vital signs, pertinent lab work and imaging were reviewed and interpreted as  discussed in the ED course. Hospitalization was considered for further testing, treatments, or serial exams/observation. However as patient is well-appearing, has a stable exam, and reassuring studies today, I do not feel that they warrant admission at this time. This plan was discussed with the patient who verbalizes agreement and comfort with this plan and seems reliable and able to return to the Emergency Department with worsening or changing symptoms.          Final Clinical Impression(s) / ED Diagnoses Final diagnoses:  Rash    Rx / DC Orders ED Discharge Orders          Ordered    cephALEXin (KEFLEX) 500 MG capsule  4 times daily        07/20/22 1103  Carlisle Cater, PA-C 07/20/22 1109    Leanord Asal K, DO 07/20/22 1548

## 2022-07-20 NOTE — ED Notes (Signed)
Rash on left should Red in color denies any itching ,or pain . Denies any injuries

## 2022-07-20 NOTE — Discharge Instructions (Signed)
Please read and follow all provided instructions.  Your diagnoses today include:  1. Rash     Tests performed today include: Vital signs. See below for your results today.   Medications prescribed:  Keflex (cephalexin) - antibiotic  You have been prescribed an antibiotic medicine: take the entire course of medicine even if you are feeling better. Stopping early can cause the antibiotic not to work.  Take any prescribed medications only as directed.   Home care instructions:  Follow any educational materials contained in this packet. Keep affected area above the level of your heart when possible. Wash area gently twice a day with warm soapy water. Do not apply alcohol or hydrogen peroxide. Cover the area if it draining or weeping.   Follow-up instructions: Please follow-up with your primary care provider or dermatologist  in the next 1 week for further evaluation of your symptoms.   Return instructions:  Return to the Emergency Department if you have: Fever Worsening symptoms Worsening pain Worsening swelling Redness of the skin that moves away from the affected area, especially if it streaks away from the affected area  Any other emergent concerns  Your vital signs today were: BP (!) 182/80 (BP Location: Right Arm)   Pulse 64   Temp 97.9 F (36.6 C) (Oral)   Resp 16   Ht '4\' 10"'$  (1.473 m)   Wt 62.1 kg   SpO2 95%   BMI 28.63 kg/m  If your blood pressure (BP) was elevated above 135/85 this visit, please have this repeated by your doctor within one month. --------------

## 2022-09-17 ENCOUNTER — Other Ambulatory Visit: Payer: Self-pay | Admitting: Family Medicine

## 2022-09-17 DIAGNOSIS — Z1231 Encounter for screening mammogram for malignant neoplasm of breast: Secondary | ICD-10-CM

## 2022-09-19 ENCOUNTER — Other Ambulatory Visit (HOSPITAL_BASED_OUTPATIENT_CLINIC_OR_DEPARTMENT_OTHER): Payer: Self-pay

## 2022-09-19 MED ORDER — AREXVY 120 MCG/0.5ML IM SUSR
INTRAMUSCULAR | 0 refills | Status: DC
  Filled 2022-09-19: qty 1, 1d supply, fill #0

## 2022-09-24 ENCOUNTER — Ambulatory Visit (INDEPENDENT_AMBULATORY_CARE_PROVIDER_SITE_OTHER): Payer: PPO | Admitting: *Deleted

## 2022-09-24 DIAGNOSIS — Z0000 Encounter for general adult medical examination without abnormal findings: Secondary | ICD-10-CM | POA: Diagnosis not present

## 2022-09-24 NOTE — Progress Notes (Signed)
Subjective:   Kimberly Edwards is a 81 y.o. female who presents for Medicare Annual (Subsequent) preventive examination.  I connected with  Karie Schwalbe on 09/24/22 by a audio enabled telemedicine application and verified that I am speaking with the correct person using two identifiers.  Patient Location: Home  Provider Location: Office/Clinic  I discussed the limitations of evaluation and management by telemedicine. The patient expressed understanding and agreed to proceed.   Review of Systems    Defer to PCP Cardiac Risk Factors include: advanced age (>5mn, >>42women);hypertension;dyslipidemia;obesity (BMI >30kg/m2)     Objective:    There were no vitals filed for this visit. There is no height or weight on file to calculate BMI.     09/24/2022   10:28 AM 07/20/2022   10:06 AM 03/10/2022    8:28 PM 02/10/2022    9:47 AM 09/18/2021   10:25 AM 05/30/2021   10:43 AM 05/28/2021    3:25 PM  Advanced Directives  Does Patient Have a Medical Advance Directive? Yes No No Yes Yes Yes Yes  Type of [place]    [place] Living will;Healthcare Power of Attorney   Does patient want to make changes to medical advance directive?    No - Patient declined  No - Patient declined   Copy of HKidderin Chart? No - copy requested    No - copy requested No - copy requested     Current Medications (verified) Outpatient Encounter Medications as of 09/24/2022  Medication Sig   aspirin 81 MG tablet Take 81 mg by mouth daily.   citalopram (CELEXA) 10 MG tablet Take 1 tablet (10 mg total) by mouth daily.   diltiazem (CARDIZEM CD) 300 MG 24 hr capsule TAKE ONE CAPSULE BY MOUTH BEFORE BREAKFAST   fish oil-omega-3 fatty acids 1000 MG capsule Take 1 g by mouth daily.   GLUCOSAMINE-CHONDROITIN PO Take 1 tablet by mouth daily. '1500mg'$  of glucosamine   lisinopril (ZESTRIL) 30 MG tablet TAKE ONE TABLET BY  MOUTH BEFORE BREAKFAST   meloxicam (MOBIC) 15 MG tablet Take 1 tablet (15 mg total) by mouth daily.   Multiple Vitamins-Minerals (MULTIVITAMIN WITH MINERALS) tablet Take 2 tablets by mouth daily. Vitafusion gummie   Polyethyl Glycol-Propyl Glycol 0.4-0.3 % SOLN Apply 1 drop to eye daily.    pravastatin (PRAVACHOL) 40 MG tablet TAKE ONE TABLET BY MOUTH BEFORE BREAKFAST   RSV vaccine recomb adjuvanted (AREXVY) 120 MCG/0.5ML injection Inject into the muscle.   triamterene-hydrochlorothiazide (DYAZIDE) 37.5-25 MG capsule TAKE ONE CAPSULE BY MOUTH BEFORE BREAKFAST   [DISCONTINUED] cephALEXin (KEFLEX) 500 MG capsule Take 1 capsule (500 mg total) by mouth 4 (four) times daily.   No facility-administered encounter medications on file as of 09/24/2022.    Allergies (verified) Oxycodone   History: Past Medical History:  Diagnosis Date   Arthritis    Depression    Heart murmur    Hyperlipidemia    Hypertension    Knee joint replacement status, left 2003   PONV (postoperative nausea and vomiting)    Right knee DJD    Past Surgical History:  Procedure Laterality Date   [place]AST BIOPSY  [place]   TONSILLECTOMY     TOTAL KNEE ARTHROPLASTY  03/23/2012   Procedure: TOTAL KNEE ARTHROPLASTY;  Surgeon: RHerbie Baltimore  Venetia Maxon, MD;  Location: [facility];  Service: Orthopedics;  Laterality: Right;  right total knee arthroplasty   Family History  Problem Relation Age of Onset   Hypertension Father    Breast cancer Sister    Social History   Socioeconomic History   Marital status: Widowed    Spouse name: Not on file   Number of children: Not on file   Years of education: Not on file   Highest education level: Not on file  Occupational History   Occupation: retired Theme park manager  Tobacco Use   Smoking status: Former    Packs/day: 0.10    Years: 50.00    Total pack years: 5.00    Types: Cigarettes     Quit date: 03/23/2012    Years since quitting: 10.5   Smokeless tobacco: Never   Tobacco comments:    social and weekends alcohol  Vaping Use   Vaping Use: Never used  Substance and Sexual Activity   Alcohol use: Yes    Comment: Wine on weekends   Drug use: No   Sexual activity: Not Currently    Partners: Male    Birth control/protection: Post-menopausal  Other Topics Concern   Not on file  Social History Narrative   Regular exercise: walk 2 times a week   Caffeine use: 2 cups of coffee daily         Social Determinants of Health   Financial Resource Strain: Low Risk  (05/23/2022)   Overall Financial Resource Strain (CARDIA)    Difficulty of Paying Living Expenses: Not hard at all  Food Insecurity: No Food Insecurity (09/24/2022)   Hunger Vital Sign    Worried About Running Out of Food in the Last Year: Never true    [place] in the Last Year: Never true  Transportation Needs: No Transportation Needs (09/24/2022)   PRAPARE - Hydrologist (Medical): No    Lack of Transportation (Non-Medical): No  Physical Activity: Insufficiently Active (05/23/2022)   Exercise Vital Sign    Days of Exercise per Week: 1 day    Minutes of Exercise per Session: 40 min  Stress: No Stress Concern Present (09/18/2021)   [place]    Feeling of Stress : Not at all  Social Connections: Moderately Integrated (09/18/2021)   Social Connection and Isolation Panel [NHANES]    Frequency of Communication with Friends and Family: More than three times a week    Frequency of Social Gatherings with Friends and Family: More than three times a week    Attends Religious Services: More than 4 times per year    Active Member of [employer] or Organizations: Yes    Attends Archivist Meetings: More than 4 times per year    Marital Status: Widowed    Tobacco Counseling Counseling given: Not  Answered Tobacco comments: social and weekends alcohol   Clinical Intake:  Pre-visit preparation completed: Yes  Pain : No/denies pain  Diabetes: No  How often do you need to have someone help you when you read instructions, pamphlets, or other written materials from your doctor or pharmacy?: 1 - Never   Activities of Daily Living    09/24/2022   10:29 AM  In your present state of health, do you have any difficulty performing the following activities:  Hearing? 1  Comment wears hearing aids  Vision? 0  Difficulty concentrating or making decisions? 0  Walking or climbing  stairs? 1  Dressing or bathing? 0  Doing errands, shopping? 0  Preparing Food and eating ? N  Using the Toilet? N  In the past six months, have you accidently leaked urine? N  Do you have problems with loss of bowel control? N  Managing your Medications? N  Managing your Finances? N  Housekeeping or managing your Housekeeping? N    Patient Care Team: Carollee Herter, Alferd Apa, DO as PCP - General (Family Medicine) Jari Pigg, MD as Consulting Physician (Dermatology) Elsie Saas, MD as Consulting Physician (Orthopedic Surgery) Calvert Cantor, MD as Consulting Physician (Ophthalmology) Garrel Ridgel, DPM as Consulting Physician (Podiatry) Cherre Robins, RPH-CPP (Pharmacist)  Indicate any recent Medical Services you may have received from other than [facility] providers in the past year (date may be approximate).     Assessment:   This is a routine wellness examination for Caily.  Hearing/Vision screen No results found.  Dietary issues and exercise activities discussed: Current Exercise Habits: The patient does not participate in regular exercise at present, Exercise limited by: orthopedic condition(s)   Goals Addressed   None    Depression Screen    09/24/2022   10:27 AM 12/25/2021    9:43 AM 09/18/2021   10:28 AM 04/02/2021   10:23 AM 06/23/2018    9:21 AM 06/17/2017    8:51 AM 10/19/2015   10:05  AM  PHQ 2/9 Scores  PHQ - 2 Score 0 0 0 0 0 0 0    Fall Risk    09/24/2022   10:25 AM 12/25/2021    9:43 AM 11/23/2021    9:36 AM 09/18/2021   10:26 AM 04/02/2021   10:23 AM  Fall Risk   Falls in the past year? 1 0 1 1 0  Number falls in past yr: 0 0 0 0 0  Injury with Fall? 1 0 1 1 0  Risk for fall due to : History of fall(s)  History of fall(s);Orthopedic patient History of fall(s)   Follow up Falls evaluation completed  Falls evaluation completed;Falls prevention discussed Falls prevention discussed Falls evaluation completed    FALL RISK PREVENTION PERTAINING TO THE HOME:  Any stairs in or around the home? No  If so, are there any without handrails? No  Home free of loose throw rugs in walkways, pet beds, electrical cords, etc? Yes  Adequate lighting in your home to reduce risk of falls? Yes   ASSISTIVE DEVICES UTILIZED TO PREVENT FALLS:  Life alert? No  Use of a cane, walker or w/c? Yes  Grab bars in the bathroom? Yes  Shower chair or bench in shower? Yes  Elevated toilet seat or a handicapped toilet?  Comfort height  TIMED UP AND GO:  Was the test performed?  no, audio visit .    Cognitive Function:        09/24/2022   10:33 AM  6CIT Screen  What Year? 0 points  What month? 0 points  What time? 0 points  Count back from 20 0 points  Months in reverse 0 points  Repeat phrase 0 points  Total Score 0 points    Immunizations Immunization History  Administered Date(s) Administered   Fluad Quad(high Dose 65+) 06/22/2019, 07/03/2021, 06/27/2022   Influenza, High Dose Seasonal PF 06/22/2014, 06/27/2015, 06/18/2016, 06/17/2017, 06/23/2018   Influenza,inj,Quad PF,6+ Mos 07/01/2013   Influenza-Unspecified 06/14/2014   PFIZER Comirnaty(Gray Top)Covid-19 Tri-Sucrose Vaccine 01/25/2021   PFIZER(Purple Top)SARS-COV-2 Vaccination 11/06/2019, 11/27/2019, 06/23/2020   Pfizer Covid-19 Office manager  102yr & up 08/28/2021   Pneumococcal Conjugate-13  09/29/2014   Pneumococcal Polysaccharide-23 10/19/2015   Respiratory Syncytial Virus Vaccine,Recomb Aduvanted(Arexvy) 09/19/2022   Tdap 05/30/2021    TDAP status: Up to date  Flu Vaccine status: Up to date  Pneumococcal vaccine status: Up to date  Covid-19 vaccine status: Information provided on how to obtain vaccines.   Qualifies for Shingles Vaccine? Yes   Zostavax completed No   Shingrix Completed?: No.    Education has been provided regarding the importance of this vaccine. Patient has been advised to call insurance company to determine out of pocket expense if they have not yet received this vaccine. Advised may also receive vaccine at local pharmacy or Health Dept. Verbalized acceptance and understanding.  Screening Tests Health Maintenance  Topic Date Due   Zoster Vaccines- Shingrix (1 of 2) Never done   COVID-19 Vaccine (6 - 2023-24 season) 06/14/2022   MAMMOGRAM  08/16/2022   Medicare Annual Wellness (AWV)  09/18/2022   DTaP/Tdap/Td (2 - Td or Tdap) 05/31/2031   Pneumonia Vaccine 81 Years old  Completed   INFLUENZA VACCINE  Completed   DEXA SCAN  Completed   HPV VACCINES  Aged Out    Health Maintenance  Health Maintenance Due  Topic Date Due   Zoster Vaccines- Shingrix (1 of 2) Never done   COVID-19 Vaccine (6 - 2023-24 season) 06/14/2022   MAMMOGRAM  08/16/2022   Medicare Annual Wellness (AWV)  09/18/2022    Colorectal cancer screening: No longer required.   Mammogram status: Completed 08/16/21. Repeat every year. Next one scheduled for 11/12/22  Bone Density status: Completed 09/25/20. Results reflect: Bone density results: OSTEOPENIA. Repeat every 2 years.  Lung Cancer Screening: (Low Dose CT Chest recommended if Age 83-80years, 30 pack-year currently smoking OR have quit w/in 15years.) does not qualify.   Additional Screening:  Hepatitis C Screening: does not qualify  Vision Screening: Recommended annual ophthalmology exams for early detection of  glaucoma and other disorders of the eye. Is the patient up to date with their annual eye exam?  Yes  Who is the provider or what is the name of the office in which the patient attends annual eye exams? Dr. DBing PlumeIf pt is not established with a provider, would they like to be referred to a provider to establish care? No .   Dental Screening: Recommended annual dental exams for proper oral hygiene  Community Resource Referral / Chronic Care Management: CRR required this visit?  No   CCM required this visit?  No      Plan:     I have personally reviewed and noted the following in the patient's chart:   Medical and social history Use of alcohol, tobacco or illicit drugs  Current medications and supplements including opioid prescriptions. Patient is not currently taking opioid prescriptions. Functional ability and status Nutritional status Physical activity Advanced directives List of other physicians Hospitalizations, surgeries, and ER visits in previous 12 months Vitals Screenings to include cognitive, depression, and falls Referrals and appointments  In addition, I have reviewed and discussed with patient certain preventive protocols, quality metrics, and best practice recommendations. A written personalized care plan for preventive services as well as general preventive health recommendations were provided to patient.   Due to this being a telephonic visit, the after visit summary with patients personalized plan was offered to patient via mail or my-chart. Patient would like to access on my-chart.   BBeatris Ship [facility]  09/24/2022  Nurse Notes: None

## 2022-09-24 NOTE — Patient Instructions (Signed)
Kimberly Edwards , Thank you for taking time to come for your Medicare Wellness Visit. I appreciate your ongoing commitment to your health goals. Please review the following plan we discussed and let me know if I can assist you in the future.   These are the goals we discussed:  Goals       Blood pressure goal <140/90      Check blood pressure 2 to 3 times per week      Chronic Care Management Pharmacy Care Plan      CARE PLAN ENTRY (see longitudinal plan of care for additional care plan information)  Current Barriers:  Chronic Disease Management support, education, and care coordination needs related to HTN, HLD, GAD, Osteoarthritis   Hypertension BP Readings from Last 3 Encounters:  03/10/22 138/72  02/19/22 138/88  02/10/22 (!) 153/82  Pharmacist Clinical Goal(s): Over the next 180 days, patient will work with PharmD and providers to maintain BP goal <140/90 Current regimen:  Diltiazem '300mg'$  daily Lisinopril '30mg'$  daily Triamterene-hydrochlorothiazide  37.5-'25mg'$  daily Patient self care activities - Over the next 180 days, patient will: Check blood pressure once weekly, document, and provide at future appointments Ensure daily salt intake < 2300 mg/day Weekly exercise goal is 150 minutes per week; Continue to walk in pool and increase as able / as recommended by Dr Para March  Hyperlipidemia Lab Results  Component Value Date/Time   LDLCALC 91 12/25/2021 10:09 AM   LDLCALC 92 06/23/2020 09:26 AM   LDLDIRECT 121.0 03/30/2015 10:14 AM  Pharmacist Clinical Goal(s): Over the next 180 days, patient will work with PharmD and providers to maintain LDL goal < 100 Current regimen:  Pravastatin '40mg'$  daily Patient self care activities - Over the next 180 days, patient will: Maintain cholesterol medication regimen.   Osteopenia/Osteoporosis Screening Pharmacist Clinical Goal(s) Over the next 90 days, patient will work with PharmD and providers to maintain bone strength Current regimen:   Multivitamin with '130mg'$  calcium and 1000 unit of vitmamin D in 2 gummies Interventions: Recommend [phone] units of vitamin D daily.  Recommend 1200 mg of calcium daily from dietary and supplemental sources. Recommend repeat bone density around December 2023 Fall prevention discussed Patient self care activities - Over the next 150 days, patient will: Continue multivitamin and daily yogurt (estimate you are getting about '900mg'$  of calcium per day - goal is '1200mg'$  per day)  Add calcium supplement '500mg'$  daily.  Recommend repeat bone density December 2023 Consider starting pharmacotherapy for bone health.   Medication management Pharmacist Clinical Goal(s): Over the next 180 days, patient will work with PharmD and providers to maintain optimal medication adherence Current pharmacy: [facility] Comprehensive medication review performed. Utilize pharmacy services for medication synchronization, packaging and delivery Patient self care activities - Over the next 180 days, patient will: Focus on medication adherence by filling and taking medications appropriately  Take medications as prescribed Report any questions or concerns to PharmD and/or provider(s)  Patient Goals/Self-Care Activities Over the next 180 days, patient will:  Take medications as prescribed check blood pressure weekly, document, and provide at future appointments Continue multivitamin and daily yogurt (estimate you are getting about '900mg'$  of calcium per day - goal is '1200mg'$  per day)  Add calcium supplement '500mg'$  daily.  Restart pool exercise when approved by orthopedist.   Please see past updates related to this goal by clicking on the "Past Updates" button in the selected goal        Feel good mentally and physically (pt-stated)  Manage My Medicine      Timeframe:  Short-Term Goal Priority:  High Start Date:   11/21/20                          Expected End Date:   01/11/21                     Follow Up Date 01/11/21   Begin taking recommended Calcium + Vitamin D for osteopenia.    Why is this important?   These steps will help you keep on track with your medicines.   Notes: Please contact PharmD with questions if you cannot find it.      Patient Stated      Drink more water        This is a list of the screening recommended for you and due dates:  Health Maintenance  Topic Date Due   Zoster (Shingles) Vaccine (1 of 2) Never done   COVID-19 Vaccine (6 - 2023-24 season) 06/14/2022   Mammogram  08/16/2022   Medicare Annual Wellness Visit  09/25/2023   DTaP/Tdap/Td vaccine (2 - Td or Tdap) 05/31/2031   Pneumonia Vaccine  Completed   Flu Shot  Completed   DEXA scan (bone density measurement)  Completed   HPV Vaccine  Aged Out     Next appointment: Follow up in one year for your annual wellness visit.   Preventive Care 33 Years and Older, Female Preventive care refers to lifestyle choices and visits with your health care provider that can promote health and wellness. What does preventive care include? A yearly physical exam. This is also called an annual well check. Dental exams once or twice a year. Routine eye exams. Ask your health care provider how often you should have your eyes checked. Personal lifestyle choices, including: Daily care of your teeth and gums. Regular physical activity. Eating a healthy diet. Avoiding tobacco and drug use. Limiting alcohol use. Practicing safe sex. Taking low-dose aspirin every day. Taking vitamin and mineral supplements as recommended by your health care provider. What happens during an annual well check? The services and screenings done by your health care provider during your annual well check will depend on your age, overall health, lifestyle risk factors, and family history of disease. Counseling  Your health care provider may ask you questions about your: Alcohol use. Tobacco use. Drug use. Emotional  well-being. Home and relationship well-being. Sexual activity. Eating habits. History of falls. Memory and ability to understand (cognition). Work and work Statistician. Reproductive health. Screening  You may have the following tests or measurements: Height, weight, and BMI. Blood pressure. Lipid and cholesterol levels. These may be checked every 5 years, or more frequently if you are over 54 years old. Skin check. Lung cancer screening. You may have this screening every year starting at age 66 if you have a 30-pack-year history of smoking and currently smoke or have quit within the past 15 years. Fecal occult blood test (FOBT) of the stool. You may have this test every year starting at age 100. Flexible sigmoidoscopy or colonoscopy. You may have a sigmoidoscopy every 5 years or a colonoscopy every 10 years starting at age 21. Hepatitis C blood test. Hepatitis B blood test. Sexually transmitted disease (STD) testing. Diabetes screening. This is done by checking your blood sugar (glucose) after you have not eaten for a while (fasting). You may have this done every 1-3 years. Bone density scan. This  is done to screen for osteoporosis. You may have this done starting at age 24. Mammogram. This may be done every 1-2 years. Talk to your health care provider about how often you should have regular mammograms. Talk with your health care provider about your test results, treatment options, and if necessary, the need for more tests. Vaccines  Your health care provider may recommend certain vaccines, such as: Influenza vaccine. This is recommended every year. Tetanus, diphtheria, and acellular pertussis (Tdap, Td) vaccine. You may need a Td booster every 10 years. Zoster vaccine. You may need this after age 66. Pneumococcal 13-valent conjugate (PCV13) vaccine. One dose is recommended after age 41. Pneumococcal polysaccharide (PPSV23) vaccine. One dose is recommended after age 19. Talk to your  health care provider about which screenings and vaccines you need and how often you need them. This information is not intended to replace advice given to you by your health care provider. Make sure you discuss any questions you have with your health care provider. Document Released: 10/27/2015 Document Revised: 06/19/2016 Document Reviewed: 08/01/2015 Elsevier Interactive Patient Education  2017 [place] Prevention in the Home Falls can cause injuries. They can happen to people of all ages. There are many things you can do to make your home safe and to help prevent falls. What can I do on the outside of my home? Regularly fix the edges of walkways and driveways and fix any cracks. Remove anything that might make you trip as you walk through a door, such as a raised step or threshold. Trim any bushes or trees on the path to your home. Use bright outdoor lighting. Clear any walking paths of anything that might make someone trip, such as rocks or tools. Regularly check to see if handrails are loose or broken. Make sure that both sides of any steps have handrails. Any raised decks and porches should have guardrails on the edges. Have any leaves, snow, or ice cleared regularly. Use sand or salt on walking paths during winter. Clean up any spills in your garage right away. This includes oil or grease spills. What can I do in the bathroom? Use night lights. Install grab bars by the toilet and in the tub and shower. Do not use towel bars as grab bars. Use non-skid mats or decals in the tub or shower. If you need to sit down in the shower, use a plastic, non-slip stool. Keep the floor dry. Clean up any water that spills on the floor as soon as it happens. Remove soap buildup in the tub or shower regularly. Attach bath mats securely with double-sided non-slip rug tape. Do not have throw rugs and other things on the floor that can make you trip. What can I do in the bedroom? Use night  lights. Make sure that you have a light by your bed that is easy to reach. Do not use any sheets or blankets that are too big for your bed. They should not hang down onto the floor. Have a firm chair that has side arms. You can use this for support while you get dressed. Do not have throw rugs and other things on the floor that can make you trip. What can I do in the kitchen? Clean up any spills right away. Avoid walking on wet floors. Keep items that you use a lot in easy-to-reach places. If you need to reach something above you, use a strong step stool that has a grab bar. Keep electrical cords out  of the way. Do not use floor polish or wax that makes floors slippery. If you must use wax, use non-skid floor wax. Do not have throw rugs and other things on the floor that can make you trip. What can I do with my stairs? Do not leave any items on the stairs. Make sure that there are handrails on both sides of the stairs and use them. Fix handrails that are broken or loose. Make sure that handrails are as long as the stairways. Check any carpeting to make sure that it is firmly attached to the stairs. Fix any carpet that is loose or worn. Avoid having throw rugs at the top or bottom of the stairs. If you do have throw rugs, attach them to the floor with carpet tape. Make sure that you have a light switch at the top of the stairs and the bottom of the stairs. If you do not have them, ask someone to add them for you. What else can I do to help prevent falls? Wear shoes that: Do not have high heels. Have rubber bottoms. Are comfortable and fit you well. Are closed at the toe. Do not wear sandals. If you use a stepladder: Make sure that it is fully opened. Do not climb a closed stepladder. Make sure that both sides of the stepladder are locked into place. Ask someone to hold it for you, if possible. Clearly mark and make sure that you can see: Any grab bars or handrails. First and last  steps. Where the edge of each step is. Use tools that help you move around (mobility aids) if they are needed. These include: Canes. Walkers. Scooters. Crutches. Turn on the lights when you go into a dark area. Replace any light bulbs as soon as they burn out. Set up your furniture so you have a clear path. Avoid moving your furniture around. If any of your floors are uneven, fix them. If there are any pets around you, be aware of where they are. Review your medicines with your doctor. Some medicines can make you feel dizzy. This can increase your chance of falling. Ask your doctor what other things that you can do to help prevent falls. This information is not intended to replace advice given to you by your health care provider. Make sure you discuss any questions you have with your health care provider. Document Released: 07/27/2009 Document Revised: 03/07/2016 Document Reviewed: 11/04/2014 Elsevier Interactive Patient Education  2017 [employer].

## 2022-10-22 DIAGNOSIS — M25512 Pain in left shoulder: Secondary | ICD-10-CM | POA: Diagnosis not present

## 2022-10-22 DIAGNOSIS — M25511 Pain in right shoulder: Secondary | ICD-10-CM | POA: Diagnosis not present

## 2022-11-01 ENCOUNTER — Other Ambulatory Visit: Payer: Self-pay | Admitting: Family Medicine

## 2022-11-01 ENCOUNTER — Ambulatory Visit: Payer: No Typology Code available for payment source | Admitting: Pharmacist

## 2022-11-01 DIAGNOSIS — M859 Disorder of bone density and structure, unspecified: Secondary | ICD-10-CM

## 2022-11-01 DIAGNOSIS — S32591D Other specified fracture of right pubis, subsequent encounter for fracture with routine healing: Secondary | ICD-10-CM

## 2022-11-01 NOTE — Patient Instructions (Signed)
Ms. Adcox,  It was a pleasure speaking with you today.  Below is a summary of your health goals and summary of our recent visit.   Bone health:  Advised on a diet that includes adequate amounts of total calcium intake 1200 mg/day, incorporating dietary supplements if diet is insufficient. Advised on vitamin D intake 1000 IU/day, including supplements if necessary. Consider checking serum vitamin D level with next labs (12/26/2022) Recommended regular weight-bearing and muscle-strengthening exercise to improve agility, strength, posture, and balance; maintain or improve bone strength; and reduce the risk of falls and fractures. Continue to use ambulatory assistance devices - walker and cane. Recheck bone density - ask if there are an cancellations when you are at [facility] 11/12/2022 - otherwise you can request appointment for future.    High Blood pressure:  Continue triamterene-hydrochlorothiazide daily and lisinopril '30mg'$  daily.  Check blood pressure at home 2 or 3 times per week and record.  Blood pressure goal of < 140/90 Continue to limit sodium intake   High Cholesterol / prevention of heart disease:  Continue pravastatin '40mg'$  daily.     As always if you have any questions or concerns especially regarding medications, please feel free to contact me either at the phone number below or with a MyChart message.   Keep up the good work!  Cherre Robins, PharmD Clinical Pharmacist [facility] [place] [facility] [phone] (direct line)  [phone] (main office number)   Patient verbalizes understanding of instructions and care plan provided today and agrees to view in [facility]. Active MyChart status and patient understanding of how to access instructions and care plan via MyChart confirmed with patient.

## 2022-11-01 NOTE — Progress Notes (Signed)
Pharmacy Note  11/01/2022 Name: Kimberly Edwards MRN: [id number] DOB: 04-28-1941  Subjective: Kimberly Edwards is a 82 y.o. year old female who is a primary care patient of Carollee Herter, Alferd Apa, DO. Clinical Pharmacist Practitioner referral was placed to assist with medication management.    Engaged with patient by telephone for follow up visit today.  Osteopenia with history of pubis ramus fracture:  Patient had mechanical fall from standing position in home 03/10/2022. Presented to ED and noted to have right inferior pubic ramus fracture.  Last DEXA was 10/13/2020. Noted to have osteopenia per lowest T-Score of -2.1. FRAX estimate at hip was >3% but patient declined pharmacotherapy at that time. She is interested in getting another DEXA. She is getting calcium and vitamin D in her daily MVI gummy which supplies about '130mg'$  in 2 gummies and eats 1 piece of cheese and 1 yogurt daily. Also eats green leafy vegetables. Estimate total daily calicum intake is about '900mg'$  to '1000mg'$ . Her MVI does have 1000 units of vitamin D.  Hypertension:  Currently taking triamterene-hydrochlorothiazide daily and lisinopril '30mg'$  daily.  Checks blood pressure at home 2 or 3 times per month. Usually 130's / 70's per patient.  Exercise - a little walking but hopes to increase exercise when she move to [place] 12/12/2022. They have a pool and exercise classes.  Diet - drinks 64 ounces of water per day. Limits salt intake; does not add salt to food.  Hyperlipidemia:  LDL has been at goal. Currently taking pravastatin '40mg'$  daily. Denies side effects.  Medication Management:  Patient uses adherence packaging with [facility]. Like packaging. 2023 med adherence good per refill records.    Objective: Review of patient status, including review of consultants reports, laboratory and other test data, was performed as part of comprehensive.  Lab Results  Component Value Date   CREATININE 0.73  06/27/2022   CREATININE 0.74 03/10/2022   CREATININE 0.89 02/10/2022    Lab Results  Component Value Date   HGBA1C 5.6 06/08/2012       Component Value Date/Time   CHOL 167 06/27/2022 0931   TRIG 101.0 06/27/2022 0931   HDL 77.00 06/27/2022 0931   CHOLHDL 2 06/27/2022 0931   VLDL 20.2 06/27/2022 0931   LDLCALC 70 06/27/2022 0931   LDLCALC 92 06/23/2020 0926   LDLDIRECT 121.0 03/30/2015 1014     Clinical ASCVD: No  The ASCVD Risk score (Arnett DK, et al., 2019) failed to calculate for the following reasons:   The 2019 ASCVD risk score is only valid for ages 65 to 39    BP Readings from Last 3 Encounters:  07/20/22 (!) 182/80  06/27/22 (!) 138/90  06/21/22 (!) 182/90     Allergies  Allergen Reactions   Oxycodone Nausea And Vomiting    Medications Reviewed Today     Reviewed by Cherre Robins, RPH-CPP (Pharmacist) on 11/01/22 at [place] List Status: <None>   Medication Order Taking? Sig Documenting Provider Last Dose Status Informant  aspirin 81 MG tablet [id number] Yes Take 81 mg by mouth daily. [provider] Taking Active Self  citalopram (CELEXA) 10 MG tablet [id number] Yes Take 1 tablet (10 mg total) by mouth daily. Carollee Herter, Kendrick Fries R, DO Taking Active   diltiazem (CARDIZEM CD) 300 MG 24 hr capsule [id number] Yes TAKE ONE CAPSULE BY MOUTH BEFORE BREAKFAST Ann Held, DO Taking Active   GLUCOSAMINE-CHONDROITIN PO [id number] Yes Take 1 tablet by mouth daily. '1500mg'$  of  glucosamine [provider] Taking Active   lisinopril (ZESTRIL) 30 MG tablet [id number] Yes TAKE ONE TABLET BY MOUTH BEFORE BREAKFAST Ann Held, DO Taking Active   meloxicam (MOBIC) 15 MG tablet [id number] Yes Take 1 tablet (15 mg total) by mouth daily. Roma Schanz R, DO Taking Active   Multiple Vitamins-Minerals (MULTIVITAMIN WITH MINERALS) tablet [id number] Yes Take 2 tablets by mouth daily. Vitafusion gummie [provider] Taking Active Self            Med Note Antony Contras, Allexis Bordenave B   Thu May 23, 2022  9:30 AM) MVI contains: 1000 units of vitamin D and '130mg'$  of calicum   Polyethyl Glycol-Propyl Glycol 0.4-0.3 % SOLN [id number] Yes Apply 1 drop to eye daily.  [provider] Taking Active   pravastatin (PRAVACHOL) 40 MG tablet [id number] Yes TAKE ONE TABLET BY MOUTH BEFORE BREAKFAST Carollee Herter, Alferd Apa, DO Taking Active   triamterene-hydrochlorothiazide (DYAZIDE) 37.5-25 MG capsule [id number] Yes TAKE ONE CAPSULE BY MOUTH BEFORE BREAKFAST Ann Held, DO Taking Active             Patient Active Problem List   Diagnosis Date Noted   Urinary tract infection with hematuria 02/19/2022   Preventative health care 04/02/2021   Bilateral hearing loss 06/23/2020   Estrogen deficiency 06/23/2020   Fatigue 12/22/2018   Generalized anxiety disorder 12/22/2018   Obesity (BMI 30-39.9) 07/01/2013   Dysuria 12/10/2012   Nausea and vomiting 03/25/2012   Lower urinary tract infectious disease 03/25/2012   Hypertension    Arthritis    Knee joint replacement status, left    Right knee DJD    HTN (hypertension) 12/09/2011   Hyperlipidemia 12/09/2011   Osteoarthritis 05/31/2011     Medication Assistance:  None required.  Patient affirms current coverage meets needs.   Assessment / Plan: Osteopenia with history of pubis ramus fracture:  Discussed risk of osteoporosis and related fractures. Advised on a diet that includes adequate amounts of total calcium intake 1200 mg/day, incorporating dietary supplements if diet is insufficient. Advised on vitamin D intake 1000 IU/day, including supplements if necessary. Consider checking serum vitamin D level with next labs (12/26/2022) Recommended regular weight-bearing and muscle-strengthening exercise to improve agility, strength, posture, and balance; maintain or improve bone strength; and reduce the risk of falls and fractures. Assessed risk factors for falls and discussed appropriate  modifications - continue to use ambulatory assistance devices - walker and cane. Recheck DEXA - consider pharmacotherapy (bisphosphonate or Prolia)  to prevent future fractures.   Hypertension:  Continue triamterene-hydrochlorothiazide daily and lisinopril '30mg'$  daily.  Check blood pressure at home 2 or 3 times per week and record.  Reviewed blood pressure goal of < 140/90 Increase exercise - goal is to work up to 150 minutes per week.  Continue to limit sodium intake  Hyperlipidemia:  Continue pravastatin '40mg'$  daily.   Medication Management:  Continue to use adherence packaging with [facility]. .  Reviewed and updated medication list Reviewed refill history and adherence  Health Maintenance:  Discussed COVID vaccine for 23-24 update - patient declined Called to see if [facility] could do DEXA when patient is there for mammogram 11/12/2022 but they had no opening. Patient will check when she is there 11/12/2022 to see if there are any cancellations. Otherwise she will made appointment for DEXA for future.    Follow Up:  phone visit in 4 to 6 months.    Cherre Robins, PharmD Clinical Pharmacist [facility]  - [facility] [phone]

## 2022-11-12 ENCOUNTER — Ambulatory Visit
Admission: RE | Admit: 2022-11-12 | Discharge: 2022-11-12 | Disposition: A | Discharge status: Home / Self Care | Payer: No Typology Code available for payment source | Source: Ambulatory Visit | Attending: Family Medicine | Admitting: Family Medicine

## 2022-11-12 DIAGNOSIS — Z1231 Encounter for screening mammogram for malignant neoplasm of breast: Secondary | ICD-10-CM | POA: Diagnosis not present

## 2022-12-01 ENCOUNTER — Encounter: Payer: Self-pay | Admitting: Family Medicine

## 2022-12-05 ENCOUNTER — Encounter: Payer: Self-pay | Admitting: Family Medicine

## 2022-12-05 ENCOUNTER — Ambulatory Visit (INDEPENDENT_AMBULATORY_CARE_PROVIDER_SITE_OTHER): Payer: No Typology Code available for payment source | Admitting: Family Medicine

## 2022-12-05 VITALS — BP 142/82 | HR 67 | Temp 98.1°F | Resp 16 | Ht <= 58 in | Wt 140.2 lb

## 2022-12-05 DIAGNOSIS — I10 Essential (primary) hypertension: Secondary | ICD-10-CM | POA: Diagnosis not present

## 2022-12-05 DIAGNOSIS — E785 Hyperlipidemia, unspecified: Secondary | ICD-10-CM

## 2022-12-05 NOTE — Assessment & Plan Note (Signed)
Well controlled, no changes to meds. Encouraged heart healthy diet such as the DASH diet and exercise as tolerated.  

## 2022-12-05 NOTE — Assessment & Plan Note (Signed)
Encourage heart healthy diet such as MIND or DASH diet, increase exercise, avoid trans fats, simple carbohydrates and processed foods, consider a krill or fish or flaxseed oil cap daily.  °

## 2022-12-05 NOTE — Progress Notes (Signed)
Established Patient Office Visit  Subjective   Patient ID: Kimberly Edwards, female    DOB: 1940-12-19  Age: 82 y.o. MRN: [id number]  Chief Complaint  Patient presents with   Form Completion    HPI Pt here to have a form filled out for indp living faciliy.   No complaints   Patient Active Problem List   Diagnosis Date Noted   Low bone density 11/01/2022   Closed fracture of inferior ramus of right pubis with routine healing 03/10/2022   Urinary tract infection with hematuria 02/19/2022   Preventative health care 04/02/2021   Bilateral hearing loss 06/23/2020   Estrogen deficiency 06/23/2020   Fatigue 12/22/2018   Generalized anxiety disorder 12/22/2018   Obesity (BMI 30-39.9) 07/01/2013   Dysuria 12/10/2012   Nausea and vomiting 03/25/2012   Lower urinary tract infectious disease 03/25/2012   Hypertension    Arthritis    Knee joint replacement status, left    Right knee DJD    HTN (hypertension) 12/09/2011   Hyperlipidemia 12/09/2011   Osteoarthritis 05/31/2011   Past Medical History:  Diagnosis Date   Arthritis    Depression    Heart murmur    Hyperlipidemia    Hypertension    Knee joint replacement status, left 2003   PONV (postoperative nausea and vomiting)    Right knee DJD    Past Surgical History:  Procedure Laterality Date   BREAST BIOPSY  2000   marker put in   [place]     left knee   TONSILLECTOMY     TOTAL KNEE ARTHROPLASTY  03/23/2012   Procedure: TOTAL KNEE ARTHROPLASTY;  Surgeon: Lorn Junes, MD;  Location: [facility];  Service: Orthopedics;  Laterality: Right;  right total knee arthroplasty   Social History   Tobacco Use   Smoking status: Former    Packs/day: 0.10    Years: 50.00    Total pack years: 5.00    Types: Cigarettes    Quit date: 03/23/2012    Years since quitting: 10.7   Smokeless tobacco: Never   Tobacco comments:    social and weekends alcohol  Vaping Use    Vaping Use: Never used  Substance Use Topics   Alcohol use: Yes    Comment: Wine on weekends   Drug use: No   Social History   Socioeconomic History   Marital status: Widowed    Spouse name: Not on file   Number of children: Not on file   Years of education: Not on file   Highest education level: Not on file  Occupational History   Occupation: retired Theme park manager  Tobacco Use   Smoking status: Former    Packs/day: 0.10    Years: 50.00    Total pack years: 5.00    Types: Cigarettes    Quit date: 03/23/2012    Years since quitting: 10.7   Smokeless tobacco: Never   Tobacco comments:    social and weekends alcohol  Vaping Use   Vaping Use: Never used  Substance and Sexual Activity   Alcohol use: Yes    Comment: Wine on weekends   Drug use: No   Sexual activity: Not Currently    Partners: Male    Birth control/protection: Post-menopausal  Other Topics Concern   Not on file  Social History Narrative   Regular exercise: walk 2 times a week   Caffeine use: 2 cups of coffee daily  Social Determinants of Health   Financial Resource Strain: Low Risk  (05/23/2022)   Overall Financial Resource Strain (CARDIA)    Difficulty of Paying Living Expenses: Not hard at all  Food Insecurity: No Food Insecurity (09/24/2022)   Hunger Vital Sign    Worried About Running Out of Food in the Last Year: Never true    Ran Out of Food in the Last Year: Never true  Transportation Needs: No Transportation Needs (09/24/2022)   PRAPARE - Hydrologist (Medical): No    Lack of Transportation (Non-Medical): No  Physical Activity: Insufficiently Active (05/23/2022)   Exercise Vital Sign    Days of Exercise per Week: 1 day    Minutes of Exercise per Session: 40 min  Stress: No Stress Concern Present (09/18/2021)   [place]    Feeling of Stress : Not at all  Social Connections: Moderately  Integrated (09/18/2021)   Social Connection and Isolation Panel [NHANES]    Frequency of Communication with Friends and Family: More than three times a week    Frequency of Social Gatherings with Friends and Family: More than three times a week    Attends Religious Services: More than 4 times per year    Active Member of [employer] or Organizations: Yes    Attends Archivist Meetings: More than 4 times per year    Marital Status: Widowed  Intimate Partner Violence: Not At Risk (09/24/2022)   Humiliation, Afraid, Rape, and Kick questionnaire    Fear of Current or Ex-Partner: No    Emotionally Abused: No    Physically Abused: No    Sexually Abused: No   Family Status  Relation Name Status   Mother  Deceased   Father  Deceased   Sister  Alive   Brother  Alive   Sister  (Not Specified)   Family History  Problem Relation Age of Onset   Hypertension Father    Breast cancer Sister    Allergies  Allergen Reactions   Oxycodone Nausea And Vomiting      Review of Systems  Constitutional:  Negative for fever and malaise/fatigue.  HENT:  Negative for congestion.   Eyes:  Negative for blurred vision.  Respiratory:  Negative for shortness of breath.   Cardiovascular:  Negative for chest pain, palpitations and leg swelling.  Gastrointestinal:  Negative for abdominal pain, blood in stool and nausea.  Genitourinary:  Negative for dysuria and frequency.  Musculoskeletal:  Negative for falls.  Skin:  Negative for rash.  Neurological:  Negative for dizziness, loss of consciousness and headaches.  Endo/Heme/Allergies:  Negative for environmental allergies.  Psychiatric/Behavioral:  Negative for depression. The patient is not nervous/anxious.       Objective:     BP (!) 142/82 (BP Location: Right Arm, Patient Position: Sitting, Cuff Size: Normal)   Pulse 67   Temp 98.1 F (36.7 C) (Oral)   Resp 16   Ht 4' 10"$  (1.473 m)   Wt 140 lb 3.2 oz (63.6 kg)   SpO2 97%   BMI 29.30  kg/m  BP Readings from Last 3 Encounters:  12/05/22 (!) 142/82  07/20/22 (!) 182/80  06/27/22 (!) 138/90   Wt Readings from Last 3 Encounters:  12/05/22 140 lb 3.2 oz (63.6 kg)  07/20/22 137 lb (62.1 kg)  06/27/22 135 lb 9.6 oz (61.5 kg)   SpO2 Readings from Last 3 Encounters:  12/05/22 97%  07/20/22 95%  06/27/22 98%      Physical Exam Vitals and nursing note reviewed.  Constitutional:      Appearance: She is well-developed.  HENT:     Head: Normocephalic and atraumatic.  Eyes:     Conjunctiva/sclera: Conjunctivae normal.  Neck:     Thyroid: No thyromegaly.     Vascular: No carotid bruit or JVD.  Cardiovascular:     Rate and Rhythm: Normal rate and regular rhythm.     Heart sounds: Normal heart sounds. No murmur heard. Pulmonary:     Effort: Pulmonary effort is normal. No respiratory distress.     Breath sounds: Normal breath sounds. No wheezing or rales.  Chest:     Chest wall: No tenderness.  Musculoskeletal:     Cervical back: Normal range of motion and neck supple.  Neurological:     Mental Status: She is alert and oriented to person, place, and time.      No results found for any visits on 12/05/22.  Last CBC Lab Results  Component Value Date   WBC 16.6 (H) 03/10/2022   HGB 14.5 03/10/2022   HCT 42.9 03/10/2022   MCV 87.9 03/10/2022   MCH 29.7 03/10/2022   RDW 13.7 03/10/2022   PLT 269 [id number]   Last metabolic panel Lab Results  Component Value Date   GLUCOSE 81 06/27/2022   NA 137 06/27/2022   K 4.4 06/27/2022   CL 101 06/27/2022   CO2 28 06/27/2022   BUN 20 06/27/2022   CREATININE 0.73 06/27/2022   GFRNONAA >60 03/10/2022   CALCIUM 9.5 06/27/2022   PROT 6.7 06/27/2022   ALBUMIN 3.9 06/27/2022   BILITOT 0.8 06/27/2022   ALKPHOS 77 06/27/2022   AST 15 06/27/2022   ALT 16 06/27/2022   ANIONGAP 12 03/10/2022   Last lipids Lab Results  Component Value Date   CHOL 167 06/27/2022   HDL 77.00 06/27/2022   LDLCALC 70 06/27/2022    LDLDIRECT 121.0 03/30/2015   TRIG 101.0 06/27/2022   CHOLHDL 2 06/27/2022   Last hemoglobin A1c Lab Results  Component Value Date   HGBA1C 5.6 06/08/2012   Last thyroid functions Lab Results  Component Value Date   TSH 1.88 09/27/2014   Last vitamin D No results found for: "25OHVITD2", "25OHVITD3", "VD25OH" Last vitamin B12 and Folate No results found for: "VITAMINB12", "FOLATE"    The ASCVD Risk score (Arnett DK, et al., 2019) failed to calculate for the following reasons:   The 2019 ASCVD risk score is only valid for ages 32 to 68    Assessment & Plan:   Problem List Items Addressed This Visit       Unprioritized   Hyperlipidemia - Primary    Encourage heart healthy diet such as MIND or DASH diet, increase exercise, avoid trans fats, simple carbohydrates and processed foods, consider a krill or fish or flaxseed oil cap daily.        Relevant Orders   CBC with Differential/Platelet   Comprehensive metabolic panel   Lipid panel   HTN (hypertension)   Relevant Orders   CBC with Differential/Platelet   Comprehensive metabolic panel   Lipid panel    No follow-ups on file.    Ann Held, DO

## 2022-12-06 LAB — CBC WITH DIFFERENTIAL/PLATELET
Basophils Absolute: 0.1 10*3/uL (ref 0.0–0.1)
Basophils Relative: 0.6 % (ref 0.0–3.0)
Eosinophils Absolute: 0.1 10*3/uL (ref 0.0–0.7)
Eosinophils Relative: 0.6 % (ref 0.0–5.0)
HCT: 45 % (ref 36.0–46.0)
Hemoglobin: 15.3 g/dL — ABNORMAL HIGH (ref 12.0–15.0)
Lymphocytes Relative: 17.7 % (ref 12.0–46.0)
Lymphs Abs: 1.7 10*3/uL (ref 0.7–4.0)
MCHC: 34 g/dL (ref 30.0–36.0)
MCV: 91 fl (ref 78.0–100.0)
Monocytes Absolute: 0.7 10*3/uL (ref 0.1–1.0)
Monocytes Relative: 7.3 % (ref 3.0–12.0)
Neutro Abs: 7.1 10*3/uL (ref 1.4–7.7)
Neutrophils Relative %: 73.8 % (ref 43.0–77.0)
Platelets: 286 10*3/uL (ref 150.0–400.0)
RBC: 4.94 Mil/uL (ref 3.87–5.11)
RDW: 14.5 % (ref 11.5–15.5)
WBC: 9.6 10*3/uL (ref 4.0–10.5)

## 2022-12-06 LAB — COMPREHENSIVE METABOLIC PANEL
ALT: 11 U/L (ref 0–35)
AST: 16 U/L (ref 0–37)
Albumin: 4.2 g/dL (ref 3.5–5.2)
Alkaline Phosphatase: 73 U/L (ref 39–117)
BUN: 27 mg/dL — ABNORMAL HIGH (ref 6–23)
CO2: 29 mEq/L (ref 19–32)
Calcium: 9.5 mg/dL (ref 8.4–10.5)
Chloride: 102 mEq/L (ref 96–112)
Creatinine, Ser: 0.82 mg/dL (ref 0.40–1.20)
GFR: 66.07 mL/min (ref >60.00)
Glucose, Bld: 80 mg/dL (ref 70–99)
Potassium: 4.4 mEq/L (ref 3.5–5.1)
Sodium: 140 mEq/L (ref 135–145)
Total Bilirubin: 0.5 mg/dL (ref 0.2–1.2)
Total Protein: 6.8 g/dL (ref 6.0–8.3)

## 2022-12-06 LAB — LIPID PANEL
Cholesterol: 179 mg/dL (ref 0–200)
HDL: 80.2 mg/dL (ref >39.00)
LDL Cholesterol: 73 mg/dL (ref 0–99)
NonHDL: 98.32
Total CHOL/HDL Ratio: 2
Triglycerides: 127 mg/dL (ref 0.0–149.0)
VLDL: 25.4 mg/dL (ref 0.0–40.0)

## 2022-12-26 ENCOUNTER — Ambulatory Visit: Payer: PPO | Admitting: Family Medicine

## 2023-01-09 ENCOUNTER — Other Ambulatory Visit: Payer: Self-pay | Admitting: Family Medicine

## 2023-01-09 DIAGNOSIS — I10 Essential (primary) hypertension: Secondary | ICD-10-CM

## 2023-01-09 DIAGNOSIS — M1990 Unspecified osteoarthritis, unspecified site: Secondary | ICD-10-CM

## 2023-01-09 DIAGNOSIS — E782 Mixed hyperlipidemia: Secondary | ICD-10-CM

## 2023-03-25 DIAGNOSIS — S4351XD Sprain of right acromioclavicular joint, subsequent encounter: Secondary | ICD-10-CM | POA: Diagnosis not present

## 2023-03-25 DIAGNOSIS — M25511 Pain in right shoulder: Secondary | ICD-10-CM | POA: Diagnosis not present

## 2023-03-25 DIAGNOSIS — M25512 Pain in left shoulder: Secondary | ICD-10-CM | POA: Diagnosis not present

## 2023-03-25 DIAGNOSIS — M25551 Pain in right hip: Secondary | ICD-10-CM | POA: Diagnosis not present

## 2023-04-02 ENCOUNTER — Ambulatory Visit (INDEPENDENT_AMBULATORY_CARE_PROVIDER_SITE_OTHER): Payer: No Typology Code available for payment source | Admitting: Pharmacist

## 2023-04-02 DIAGNOSIS — E782 Mixed hyperlipidemia: Secondary | ICD-10-CM

## 2023-04-02 DIAGNOSIS — M859 Disorder of bone density and structure, unspecified: Secondary | ICD-10-CM

## 2023-04-02 DIAGNOSIS — I10 Essential (primary) hypertension: Secondary | ICD-10-CM

## 2023-04-02 NOTE — Progress Notes (Signed)
Pharmacy Note  04/02/2023 Name: Kimberly Edwards MRN: [id number] DOB: [birth year]  Subjective: Kimberly Edwards is a 82 y.o. year old female who is a primary care patient of Zola Button, Grayling Congress, DO. Clinical Pharmacist Practitioner referral was placed to assist with medication management.    Engaged with patient by telephone for follow up visit today.  Osteopenia with history of pubis ramus fracture:  Patient had mechanical fall from standing position in home 03/10/2022. Presented to ED and noted to have right inferior pubic ramus fracture.  Last DEXA was 10/13/2020. Noted to have osteopenia per lowest T-Score of -2.1. FRAX estimate at hip was >3% but patient declined pharmacotherapy at that time.  Order for repeat DEXA has been placed by PCP in Jan 20245 but patient has not scheduled yet.   Current theray: calcium and vitamin D in her daily MVI gummy which supplies about 130mg  in 2 gummies; Her MVI has 1000 units of vitamin D.  Diet: eats 1 piece of cheese and 1 yogurt daily. Also eats green leafy vegetables.  Estimate total daily calicum intake is about 900mg  to 1000mg .   Hypertension:  Currently taking triamterene-hydrochlorothiazide daily and lisinopril 30mg  daily.  Checks blood pressure at at [facility] 2 to 4 times per month. Per patient is usually 130's / 70's Exercise - a little walking  Diet - drinks 64 ounces of water per day. Limits salt intake; does not add salt to food.   Hyperlipidemia:  LDL has been at goal.  Currently taking pravastatin 40mg  daily. Denies side effects.   Medication Management:  Patient uses adherence packaging with [facility]. Likes packaging. So far in 2024 has filled meds on time.     Objective: Review of patient status, including review of consultants reports, laboratory and other test data, was performed as part of comprehensive.  Lab Results  Component Value Date   CREATININE 0.82 12/05/2022   CREATININE 0.73 06/27/2022    CREATININE 0.74 03/10/2022    Lab Results  Component Value Date   HGBA1C 5.6 06/08/2012       Component Value Date/Time   CHOL 179 12/05/2022 1351   TRIG 127.0 12/05/2022 1351   HDL 80.20 12/05/2022 1351   CHOLHDL 2 12/05/2022 1351   VLDL 25.4 12/05/2022 1351   LDLCALC 73 12/05/2022 1351   LDLCALC 92 06/23/2020 0926   LDLDIRECT 121.0 03/30/2015 1014     Clinical ASCVD: No  The ASCVD Risk score (Arnett DK, et al., 2019) failed to calculate for the following reasons:   The 2019 ASCVD risk score is only valid for ages 17 to 42    BP Readings from Last 3 Encounters:  12/05/22 (!) 142/82  07/20/22 (!) 182/80  06/27/22 (!) 138/90     Allergies  Allergen Reactions   Oxycodone Nausea And Vomiting    Medications Reviewed Today     Reviewed by Roxanne Gates, CMA (Certified Medical Assistant) on 12/05/22 at 1317  Med List Status: <None>   Medication Order Taking? Sig Documenting Provider Last Dose Status Informant  aspirin 81 MG tablet [id number]  Take 81 mg by mouth daily. [provider]  Active Self  citalopram (CELEXA) 10 MG tablet [id number]  Take 1 tablet (10 mg total) by mouth daily. Zola Button, Myrene Buddy R, DO  Active   diltiazem (CARDIZEM CD) 300 MG 24 hr capsule [id number]  TAKE ONE CAPSULE BY MOUTH BEFORE BREAKFAST Donato Schultz, DO  Active   GLUCOSAMINE-CHONDROITIN PO [id number]  Take 1 tablet  by mouth daily. 1500mg  of glucosamine [provider]  Active   lisinopril (ZESTRIL) 30 MG tablet [id number]  TAKE ONE TABLET BY MOUTH BEFORE BREAKFAST Zola Button, Grayling Congress, DO  Active   meloxicam (MOBIC) 15 MG tablet [id number]  Take 1 tablet (15 mg total) by mouth daily. Zola Button, Myrene Buddy R, DO  Active   Multiple Vitamins-Minerals (MULTIVITAMIN WITH MINERALS) tablet [id number]  Take 2 tablets by mouth daily. Vitafusion gummie [provider]  Active Self           Med Note Clydie Braun, Aven Cegielski B   Thu May 23, 2022  9:30 AM) MVI contains: 1000 units  of vitamin D and 130mg  of calicum   Polyethyl Glycol-Propyl Glycol 0.4-0.3 % SOLN [id number]  Apply 1 drop to eye daily.  [provider]  Active   pravastatin (PRAVACHOL) 40 MG tablet [id number]  TAKE ONE TABLET BY MOUTH BEFORE BREAKFAST Donato Schultz, DO  Active   triamterene-hydrochlorothiazide (DYAZIDE) 37.5-25 MG capsule [id number]  TAKE ONE CAPSULE BY MOUTH BEFORE BREAKFAST Donato Schultz, DO  Active             Patient Active Problem List   Diagnosis Date Noted   Low bone density 11/01/2022   Closed fracture of inferior ramus of right pubis with routine healing 03/10/2022   Urinary tract infection with hematuria 02/19/2022   Preventative health care 04/02/2021   Bilateral hearing loss 06/23/2020   Estrogen deficiency 06/23/2020   Fatigue 12/22/2018   Generalized anxiety disorder 12/22/2018   Obesity (BMI 30-39.9) 07/01/2013   Dysuria 12/10/2012   Nausea and vomiting 03/25/2012   Lower urinary tract infectious disease 03/25/2012   Hypertension    Arthritis    Knee joint replacement status, left    Right knee DJD    HTN (hypertension) 12/09/2011   Hyperlipidemia 12/09/2011   Osteoarthritis 05/31/2011     Medication Assistance:  None required.  Patient affirms current coverage meets needs.   Assessment / Plan: Osteopenia with history of pubis ramus fracture:  Discussed risk of osteoporosis and related fractures. Advised on vitamin D intake 1000 IU/day, including supplements if necessary. Consider checking serum vitamin D level with next labs  Recommended regular weight-bearing and muscle-strengthening exercise to improve agility, strength, posture, and balance; maintain or improve bone strength; and reduce the risk of falls and fractures. Assessed risk factors for falls and discussed appropriate modifications - continue to use ambulatory assistance devices - walker and cane. Reminded patient to make appointment to recheck DEXA - consider  pharmacotherapy (bisphosphonate or Prolia)  to prevent future fractures.   Hypertension:  Continue triamterene-hydrochlorothiazide daily and lisinopril 30mg  daily.  Check blood pressure at [facility] 2 or 3 times per week and record.  Reviewed blood pressure goal of < 140/90 Increase exercise - increase by 5 minutes per session to goal of 150 minutes per week.  Continue to limit sodium intake  Hyperlipidemia:  Continue pravastatin 40mg  daily.   Medication Management:  Continue to use adherence packaging with [facility]. .  Reviewed and updated medication list Reviewed refill history and adherence  Health Maintenance:  Reminded to get Dexa  - order has already been placed.   Follow Up:  No follow up with pharmacy team needed. Patient is aware to contact Clinical Pharmacist Practitioner if any questions regarding medications in the future.    Henrene Pastor, PharmD Clinical Pharmacist [facility]  - [facility] [phone]

## 2023-05-14 ENCOUNTER — Encounter (INDEPENDENT_AMBULATORY_CARE_PROVIDER_SITE_OTHER): Payer: Self-pay

## 2023-06-05 ENCOUNTER — Encounter: Payer: Self-pay | Admitting: Family Medicine

## 2023-06-05 ENCOUNTER — Ambulatory Visit (INDEPENDENT_AMBULATORY_CARE_PROVIDER_SITE_OTHER): Payer: No Typology Code available for payment source | Admitting: Family Medicine

## 2023-06-05 VITALS — BP 138/82 | HR 66 | Temp 98.6°F | Resp 18 | Ht <= 58 in | Wt 142.0 lb

## 2023-06-05 DIAGNOSIS — M8580 Other specified disorders of bone density and structure, unspecified site: Secondary | ICD-10-CM | POA: Diagnosis not present

## 2023-06-05 DIAGNOSIS — E782 Mixed hyperlipidemia: Secondary | ICD-10-CM | POA: Diagnosis not present

## 2023-06-05 DIAGNOSIS — M1990 Unspecified osteoarthritis, unspecified site: Secondary | ICD-10-CM | POA: Diagnosis not present

## 2023-06-05 DIAGNOSIS — I10 Essential (primary) hypertension: Secondary | ICD-10-CM | POA: Diagnosis not present

## 2023-06-05 DIAGNOSIS — F411 Generalized anxiety disorder: Secondary | ICD-10-CM | POA: Diagnosis not present

## 2023-06-05 LAB — COMPREHENSIVE METABOLIC PANEL
ALT: 12 U/L (ref 0–35)
AST: 16 U/L (ref 0–37)
Albumin: 4.1 g/dL (ref 3.5–5.2)
Alkaline Phosphatase: 77 U/L (ref 39–117)
BUN: 24 mg/dL — ABNORMAL HIGH (ref 6–23)
CO2: 28 meq/L (ref 19–32)
Calcium: 9.6 mg/dL (ref 8.4–10.5)
Chloride: 102 meq/L (ref 96–112)
Creatinine, Ser: 0.81 mg/dL (ref 0.40–1.20)
GFR: 66.82 mL/min (ref >60.00)
Glucose, Bld: 104 mg/dL — ABNORMAL HIGH (ref 70–99)
Potassium: 4.7 meq/L (ref 3.5–5.1)
Sodium: 138 meq/L (ref 135–145)
Total Bilirubin: 0.7 mg/dL (ref 0.2–1.2)
Total Protein: 6.7 g/dL (ref 6.0–8.3)

## 2023-06-05 LAB — CBC WITH DIFFERENTIAL/PLATELET
Basophils Absolute: 0 10*3/uL (ref 0.0–0.1)
Basophils Relative: 0.5 % (ref 0.0–3.0)
Eosinophils Absolute: 0.1 10*3/uL (ref 0.0–0.7)
Eosinophils Relative: 0.9 % (ref 0.0–5.0)
HCT: 45.2 % (ref 36.0–46.0)
Hemoglobin: 14.5 g/dL (ref 12.0–15.0)
Lymphocytes Relative: 21.9 % (ref 12.0–46.0)
Lymphs Abs: 1.8 10*3/uL (ref 0.7–4.0)
MCHC: 32.2 g/dL (ref 30.0–36.0)
MCV: 91.1 fl (ref 78.0–100.0)
Monocytes Absolute: 0.6 10*3/uL (ref 0.1–1.0)
Monocytes Relative: 7.2 % (ref 3.0–12.0)
Neutro Abs: 5.8 10*3/uL (ref 1.4–7.7)
Neutrophils Relative %: 69.5 % (ref 43.0–77.0)
Platelets: 267 10*3/uL (ref 150.0–400.0)
RBC: 4.96 Mil/uL (ref 3.87–5.11)
RDW: 15.4 % (ref 11.5–15.5)
WBC: 8.3 10*3/uL (ref 4.0–10.5)

## 2023-06-05 LAB — LIPID PANEL
Cholesterol: 183 mg/dL (ref 0–200)
HDL: 67.8 mg/dL (ref >39.00)
LDL Cholesterol: 98 mg/dL (ref 0–99)
NonHDL: 114.73
Total CHOL/HDL Ratio: 3
Triglycerides: 84 mg/dL (ref 0.0–149.0)
VLDL: 16.8 mg/dL (ref 0.0–40.0)

## 2023-06-05 MED ORDER — LISINOPRIL 30 MG PO TABS: ORAL_TABLET | ORAL | 1 refills | Status: DC

## 2023-06-05 MED ORDER — MELOXICAM 15 MG PO TABS: 15.0000 mg | ORAL_TABLET | Freq: Every day | ORAL | 3 refills | Status: DC

## 2023-06-05 MED ORDER — DILTIAZEM HCL ER COATED BEADS 300 MG PO CP24: ORAL_CAPSULE | ORAL | 1 refills | Status: DC

## 2023-06-05 MED ORDER — CITALOPRAM HYDROBROMIDE 10 MG PO TABS: 10.0000 mg | ORAL_TABLET | Freq: Every day | ORAL | 3 refills | Status: DC

## 2023-06-05 MED ORDER — PRAVASTATIN SODIUM 40 MG PO TABS: ORAL_TABLET | ORAL | 1 refills | Status: DC

## 2023-06-05 MED ORDER — TRIAMTERENE-HCTZ 37.5-25 MG PO CAPS: ORAL_CAPSULE | ORAL | 1 refills | Status: DC

## 2023-06-05 NOTE — Progress Notes (Signed)
Established Patient Office Visit  Subjective   Patient ID: Kimberly Edwards, female    DOB: [birth year]  Age: 82 y.o. MRN: [id number]  Chief Complaint  Patient presents with   Hypertension   Hyperlipidemia   Follow-up    HPI Discussed the use of AI scribe software for clinical note transcription with the patient, who gave verbal consent to proceed.  History of Present Illness   The patient, with a history of hypertension, hyperlipidemia, arthritis, and depression, presents for a medication refill. She is currently taking meloxicam, pravastatin, lisinopril, diltiazem, citalopram, and triamterene 37.5 mg. She reports satisfaction with her current regimen and denies any side effects. She has recently moved to an independent living facility and has her medications delivered by a mail order pharmacy. She reports a recent fall at her new residence, but denies any significant injuries. She is planning a trip to [place] in the near future.      Patient Active Problem List   Diagnosis Date Noted   Low bone density 11/01/2022   Closed fracture of inferior ramus of right pubis with routine healing 03/10/2022   Urinary tract infection with hematuria 02/19/2022   Preventative health care 04/02/2021   Bilateral hearing loss 06/23/2020   Estrogen deficiency 06/23/2020   Fatigue 12/22/2018   Generalized anxiety disorder 12/22/2018   Obesity (BMI 30-39.9) 07/01/2013   Dysuria 12/10/2012   Nausea and vomiting 03/25/2012   Lower urinary tract infectious disease 03/25/2012   Hypertension    Arthritis    Knee joint replacement status, left    Right knee DJD    HTN (hypertension) 12/09/2011   Hyperlipidemia 12/09/2011   Osteoarthritis 05/31/2011   Past Medical History:  Diagnosis Date   Arthritis    Depression    Heart murmur    Hyperlipidemia    Hypertension    Knee joint replacement status, left 2003   PONV (postoperative nausea and vomiting)    Right knee DJD    Past Surgical History:   Procedure Laterality Date   BREAST BIOPSY  2000   marker put in   CHOLECYSTECTOMY  1998   JOINT REPLACEMENT     REPLACEMENT TOTAL KNEE     left knee   TONSILLECTOMY     TOTAL KNEE ARTHROPLASTY  03/23/2012   Procedure: TOTAL KNEE ARTHROPLASTY;  Surgeon: Nilda Simmer, MD;  Location: [facility] OR;  Service: Orthopedics;  Laterality: Right;  right total knee arthroplasty   Social History   Tobacco Use   Smoking status: Former    Current packs/day: 0.00    Average packs/day: 0.1 packs/day for 50.0 years (5.0 ttl pk-yrs)    Types: Cigarettes    Start date: 03/23/1962    Quit date: 03/23/2012    Years since quitting: 11.2   Smokeless tobacco: Never   Tobacco comments:    social and weekends alcohol  Vaping Use   Vaping status: Never Used  Substance Use Topics   Alcohol use: Yes    Comment: Wine on weekends   Drug use: No   Social History   Socioeconomic History   Marital status: Widowed    Spouse name: Not on file   Number of children: Not on file   Years of education: Not on file   Highest education level: Not on file  Occupational History   Occupation: retired Interior and spatial designer  Tobacco Use   Smoking status: Former    Current packs/day: 0.00    Average packs/day: 0.1 packs/day for 50.0 years (5.0 ttl pk-yrs)  Types: Cigarettes    Start date: 03/23/1962    Quit date: 03/23/2012    Years since quitting: 11.2   Smokeless tobacco: Never   Tobacco comments:    social and weekends alcohol  Vaping Use   Vaping status: Never Used  Substance and Sexual Activity   Alcohol use: Yes    Comment: Wine on weekends   Drug use: No   Sexual activity: Not Currently    Partners: Male    Birth control/protection: Post-menopausal  Other Topics Concern   Not on file  Social History Narrative   Regular exercise: walk 2 times a week   Caffeine use: 2 cups of coffee daily         Social Determinants of Health   Financial Resource Strain: Low Risk  (04/02/2023)   Overall Financial  Resource Strain (CARDIA)    Difficulty of Paying Living Expenses: Not hard at all  Food Insecurity: No Food Insecurity (09/24/2022)   Hunger Vital Sign    Worried About Running Out of Food in the Last Year: Never true    Ran Out of Food in the Last Year: Never true  Transportation Needs: No Transportation Needs (09/24/2022)   PRAPARE - Administrator, Civil Service (Medical): No    Lack of Transportation (Non-Medical): No  Physical Activity: Insufficiently Active (04/02/2023)   Exercise Vital Sign    Days of Exercise per Week: 2 days    Minutes of Exercise per Session: 20 min  Stress: No Stress Concern Present (09/18/2021)   [employer] of Occupational Health - Occupational Stress Questionnaire    Feeling of Stress : Not at all  Social Connections: Moderately Isolated (04/02/2023)   Social Connection and Isolation Panel [NHANES]    Frequency of Communication with Friends and Family: Three times a week    Frequency of Social Gatherings with Friends and Family: More than three times a week    Attends Religious Services: More than 4 times per year    Active Member of Clubs or Organizations: No    Attends Banker Meetings: Never    Marital Status: Widowed  Intimate Partner Violence: Not At Risk (09/24/2022)   Humiliation, Afraid, Rape, and Kick questionnaire    Fear of Current or Ex-Partner: No    Emotionally Abused: No    Physically Abused: No    Sexually Abused: No   Family Status  Relation Name Status   Mother  Deceased   Father  Deceased   Sister  Alive   Brother  Alive   Sister  (Not Specified)  No partnership data on file   Family History  Problem Relation Age of Onset   Hypertension Father    Breast cancer Sister    Allergies  Allergen Reactions   Oxycodone Nausea And Vomiting      Review of Systems  Constitutional:  Negative for chills, fever and malaise/fatigue.  HENT:  Negative for congestion and hearing loss.   Eyes:  Negative  for blurred vision and discharge.  Respiratory:  Negative for cough, sputum production and shortness of breath.   Cardiovascular:  Negative for chest pain, palpitations and leg swelling.  Gastrointestinal:  Negative for abdominal pain, blood in stool, constipation, diarrhea, heartburn, nausea and vomiting.  Genitourinary:  Negative for dysuria, frequency, hematuria and urgency.  Musculoskeletal:  Negative for back pain, falls and myalgias.  Skin:  Negative for rash.  Neurological:  Negative for dizziness, sensory change, loss of consciousness, weakness and headaches.  Endo/Heme/Allergies:  Negative for environmental allergies. Does not bruise/bleed easily.  Psychiatric/Behavioral:  Negative for depression and suicidal ideas. The patient is not nervous/anxious and does not have insomnia.       Objective:     BP 138/82 (BP Location: Left Arm, Patient Position: Sitting, Cuff Size: Normal)   Pulse 66   Temp 98.6 F (37 C) (Oral)   Resp 18   Ht 4\' 10"  (1.473 m)   Wt 142 lb (64.4 kg)   SpO2 97%   BMI 29.68 kg/m  BP Readings from Last 3 Encounters:  06/05/23 138/82  12/05/22 (!) 142/82  07/20/22 (!) 182/80   Wt Readings from Last 3 Encounters:  06/05/23 142 lb (64.4 kg)  12/05/22 140 lb 3.2 oz (63.6 kg)  07/20/22 137 lb (62.1 kg)   SpO2 Readings from Last 3 Encounters:  06/05/23 97%  12/05/22 97%  07/20/22 95%      Physical Exam Vitals and nursing note reviewed.  Constitutional:      General: She is not in acute distress.    Appearance: Normal appearance. She is well-developed.  HENT:     Head: Normocephalic and atraumatic.  Eyes:     General: No scleral icterus.       Right eye: No discharge.        Left eye: No discharge.  Cardiovascular:     Rate and Rhythm: Normal rate and regular rhythm.     Heart sounds: No murmur heard. Pulmonary:     Effort: Pulmonary effort is normal. No respiratory distress.     Breath sounds: Normal breath sounds.  Musculoskeletal:         General: Normal range of motion.     Cervical back: Normal range of motion and neck supple.     Right lower leg: No edema.     Left lower leg: No edema.  Skin:    General: Skin is warm and dry.  Neurological:     Mental Status: She is alert and oriented to person, place, and time.  Psychiatric:        Mood and Affect: Mood normal.        Behavior: Behavior normal.        Thought Content: Thought content normal.        Judgment: Judgment normal.      No results found for any visits on 06/05/23.  Last CBC Lab Results  Component Value Date   WBC 9.6 12/05/2022   HGB 15.3 (H) 12/05/2022   HCT 45.0 12/05/2022   MCV 91.0 12/05/2022   MCH 29.7 03/10/2022   RDW 14.5 12/05/2022   PLT 286.0 12/05/2022   Last metabolic panel Lab Results  Component Value Date   GLUCOSE 80 12/05/2022   NA 140 12/05/2022   K 4.4 12/05/2022   CL 102 12/05/2022   CO2 29 12/05/2022   BUN 27 (H) 12/05/2022   CREATININE 0.82 12/05/2022   GFR 66.07 12/05/2022   CALCIUM 9.5 12/05/2022   PROT 6.8 12/05/2022   ALBUMIN 4.2 12/05/2022   BILITOT 0.5 12/05/2022   ALKPHOS 73 12/05/2022   AST 16 12/05/2022   ALT 11 12/05/2022   ANIONGAP 12 03/10/2022   Last lipids Lab Results  Component Value Date   CHOL 179 12/05/2022   HDL 80.20 12/05/2022   LDLCALC 73 12/05/2022   LDLDIRECT 121.0 03/30/2015   TRIG 127.0 12/05/2022   CHOLHDL 2 12/05/2022   Last hemoglobin A1c Lab Results  Component Value Date   HGBA1C  5.6 06/08/2012   Last thyroid functions Lab Results  Component Value Date   TSH 1.88 09/27/2014   Last vitamin D No results found for: "25OHVITD2", "25OHVITD3", "VD25OH" Last vitamin B12 and Folate No results found for: "VITAMINB12", "FOLATE"    The ASCVD Risk score (Arnett DK, et al., 2019) failed to calculate for the following reasons:   The 2019 ASCVD risk score is only valid for ages 10 to 50    Assessment & Plan:   Problem List Items Addressed This Visit        Unprioritized   Arthritis   Relevant Medications   meloxicam (MOBIC) 15 MG tablet   Generalized anxiety disorder   Relevant Medications   citalopram (CELEXA) 10 MG tablet   Hyperlipidemia   Relevant Medications   diltiazem (CARDIZEM CD) 300 MG 24 hr capsule   lisinopril (ZESTRIL) 30 MG tablet   pravastatin (PRAVACHOL) 40 MG tablet   triamterene-hydrochlorothiazide (DYAZIDE) 37.5-25 MG capsule   Other Relevant Orders   CBC with Differential/Platelet   Comprehensive metabolic panel   Lipid panel   TSH   Other Visit Diagnoses     Osteopenia, unspecified location    -  Primary   Relevant Orders   DG BONE DENSITY (DXA)   Essential hypertension       Relevant Medications   diltiazem (CARDIZEM CD) 300 MG 24 hr capsule   lisinopril (ZESTRIL) 30 MG tablet   pravastatin (PRAVACHOL) 40 MG tablet   triamterene-hydrochlorothiazide (DYAZIDE) 37.5-25 MG capsule   Other Relevant Orders   CBC with Differential/Platelet   Comprehensive metabolic panel   Lipid panel   TSH     Assessment and Plan    Chronic Medications Patient is on Meloxicam, Pravastatin, Lisinopril, Diltiazem, Citalopram, and Triamterene 37.5mg . Patient reports satisfaction with current medications. -Refill all medications.  General Health Maintenance Up-to-date with mammogram, due for bone density test. -Schedule bone density test in conjunction with next mammogram in January 2025. -Inquire about flu shot availability at patient's living facility.  Follow-up after patient's return from [place].        No follow-ups on file.    Donato Schultz, DO

## 2023-06-06 ENCOUNTER — Other Ambulatory Visit: Payer: Self-pay | Admitting: Family Medicine

## 2023-06-06 DIAGNOSIS — F411 Generalized anxiety disorder: Secondary | ICD-10-CM

## 2023-06-06 LAB — TSH: TSH: 1.73 u[IU]/mL (ref 0.35–5.50)

## 2023-06-20 ENCOUNTER — Telehealth: Payer: Self-pay | Admitting: Family Medicine

## 2023-06-20 DIAGNOSIS — I10 Essential (primary) hypertension: Secondary | ICD-10-CM

## 2023-06-20 DIAGNOSIS — E782 Mixed hyperlipidemia: Secondary | ICD-10-CM

## 2023-06-20 DIAGNOSIS — M1990 Unspecified osteoarthritis, unspecified site: Secondary | ICD-10-CM

## 2023-06-20 MED ORDER — TRIAMTERENE-HCTZ 37.5-25 MG PO CAPS: ORAL_CAPSULE | ORAL | 1 refills | Status: DC

## 2023-06-20 MED ORDER — PRAVASTATIN SODIUM 40 MG PO TABS: ORAL_TABLET | ORAL | 1 refills | Status: DC

## 2023-06-20 MED ORDER — MELOXICAM 15 MG PO TABS: 15.0000 mg | ORAL_TABLET | Freq: Every day | ORAL | 3 refills | Status: DC

## 2023-06-20 MED ORDER — DILTIAZEM HCL ER COATED BEADS 300 MG PO CP24: ORAL_CAPSULE | ORAL | 1 refills | Status: DC

## 2023-06-20 MED ORDER — LISINOPRIL 30 MG PO TABS: ORAL_TABLET | ORAL | 1 refills | Status: DC

## 2023-06-20 NOTE — Telephone Encounter (Signed)
Kimberly Edwards with [facility] pharmacy called to request refills on the following medications:   Pravastatin 40 mg Meloxicam 15 mg Lisinopril 30 mg Triamterene-hydrochlorothiazide 37.5-25 mg Diltiazem 300 mg   Please send to [facility] pharmacy.

## 2023-06-20 NOTE — Telephone Encounter (Signed)
Rx sent 

## 2023-06-20 NOTE — Addendum Note (Signed)
Addended by: Roxanne Gates on: 06/20/2023 02:49 PM   Modules accepted: Orders

## 2023-06-23 ENCOUNTER — Telehealth: Payer: Self-pay | Admitting: Family Medicine

## 2023-06-23 DIAGNOSIS — E782 Mixed hyperlipidemia: Secondary | ICD-10-CM

## 2023-06-23 DIAGNOSIS — M1990 Unspecified osteoarthritis, unspecified site: Secondary | ICD-10-CM

## 2023-06-23 DIAGNOSIS — I10 Essential (primary) hypertension: Secondary | ICD-10-CM

## 2023-06-23 DIAGNOSIS — F411 Generalized anxiety disorder: Secondary | ICD-10-CM

## 2023-06-23 MED ORDER — CITALOPRAM HYDROBROMIDE 10 MG PO TABS: 10.0000 mg | ORAL_TABLET | Freq: Every day | ORAL | 1 refills | Status: DC

## 2023-06-23 MED ORDER — MELOXICAM 15 MG PO TABS: 15.0000 mg | ORAL_TABLET | Freq: Every day | ORAL | 3 refills | Status: DC

## 2023-06-23 MED ORDER — LISINOPRIL 30 MG PO TABS: ORAL_TABLET | ORAL | 1 refills | Status: DC

## 2023-06-23 MED ORDER — TRIAMTERENE-HCTZ 37.5-25 MG PO CAPS: ORAL_CAPSULE | ORAL | 1 refills | Status: DC

## 2023-06-23 MED ORDER — DILTIAZEM HCL ER COATED BEADS 300 MG PO CP24: ORAL_CAPSULE | ORAL | 1 refills | Status: DC

## 2023-06-23 MED ORDER — PRAVASTATIN SODIUM 40 MG PO TABS: ORAL_TABLET | ORAL | 1 refills | Status: DC

## 2023-06-23 NOTE — Telephone Encounter (Signed)
Rx sent 

## 2023-06-23 NOTE — Telephone Encounter (Signed)
Pt called & requested for Dr. Laury Axon to send all of her updated prescriptions to [facility] instead of [facility]. Please advise pt if request can be accommodated.

## 2023-06-23 NOTE — Addendum Note (Signed)
Addended by: Roxanne Gates on: 06/23/2023 04:31 PM   Modules accepted: Orders

## 2023-06-25 DIAGNOSIS — R2681 Unsteadiness on feet: Secondary | ICD-10-CM | POA: Diagnosis not present

## 2023-06-25 DIAGNOSIS — E663 Overweight: Secondary | ICD-10-CM | POA: Diagnosis not present

## 2023-06-25 DIAGNOSIS — I700 Atherosclerosis of aorta: Secondary | ICD-10-CM | POA: Diagnosis not present

## 2023-06-25 DIAGNOSIS — N1831 Chronic kidney disease, stage 3a: Secondary | ICD-10-CM | POA: Diagnosis not present

## 2023-06-25 DIAGNOSIS — Z6829 Body mass index (BMI) 29.0-29.9, adult: Secondary | ICD-10-CM | POA: Diagnosis not present

## 2023-06-25 DIAGNOSIS — E785 Hyperlipidemia, unspecified: Secondary | ICD-10-CM | POA: Diagnosis not present

## 2023-06-25 DIAGNOSIS — I129 Hypertensive chronic kidney disease with stage 1 through stage 4 chronic kidney disease, or unspecified chronic kidney disease: Secondary | ICD-10-CM | POA: Diagnosis not present

## 2023-06-25 DIAGNOSIS — M1990 Unspecified osteoarthritis, unspecified site: Secondary | ICD-10-CM | POA: Diagnosis not present

## 2023-06-25 DIAGNOSIS — F411 Generalized anxiety disorder: Secondary | ICD-10-CM | POA: Diagnosis not present

## 2023-06-25 DIAGNOSIS — F17211 Nicotine dependence, cigarettes, in remission: Secondary | ICD-10-CM | POA: Diagnosis not present

## 2023-06-25 DIAGNOSIS — Z008 Encounter for other general examination: Secondary | ICD-10-CM | POA: Diagnosis not present

## 2023-07-08 DIAGNOSIS — M25512 Pain in left shoulder: Secondary | ICD-10-CM | POA: Diagnosis not present

## 2023-10-02 ENCOUNTER — Ambulatory Visit: Payer: No Typology Code available for payment source

## 2023-10-02 VITALS — Ht <= 58 in | Wt 142.0 lb

## 2023-10-02 DIAGNOSIS — Z0000 Encounter for general adult medical examination without abnormal findings: Secondary | ICD-10-CM | POA: Diagnosis not present

## 2023-10-02 NOTE — Patient Instructions (Addendum)
Kimberly Edwards , Thank you for taking time to come for your Medicare Wellness Visit. I appreciate your ongoing commitment to your health goals. Please review the following plan we discussed and let me know if I can assist you in the future.   Referrals/Orders/Follow-Ups/Clinician Recommendations:   This is a list of the screening recommended for you and due dates:  Health Maintenance  Topic Date Due   Zoster (Shingles) Vaccine (1 of 2) Never done   DEXA scan (bone density measurement)  09/25/2022   Flu Shot  05/15/2023   COVID-19 Vaccine (6 - 2024-25 season) 06/15/2023   Mammogram  11/13/2023   Medicare Annual Wellness Visit  10/01/2024   DTaP/Tdap/Td vaccine (2 - Td or Tdap) 05/31/2031   Pneumonia Vaccine  Completed   HPV Vaccine  Aged Out    Advanced directives: (Copy Requested) Please bring a copy of your health care power of attorney and living will to the office to be added to your chart at your convenience.  Next Medicare Annual Wellness Visit scheduled for next year: Yes

## 2023-10-02 NOTE — Progress Notes (Signed)
Subjective:   Kimberly Edwards is a 82 y.o. female who presents for Medicare Annual (Subsequent) preventive examination.  Visit Complete: Virtual I connected with  Valetta Close on 10/02/23 by a audio enabled telemedicine application and verified that I am speaking with the correct person using two identifiers.  Patient Location: Home  Provider Location: Home Office  I discussed the limitations of evaluation and management by telemedicine. The patient expressed understanding and agreed to proceed.  Vital Signs: Because this visit was a virtual/telehealth visit, some criteria may be missing or patient reported. Any vitals not documented were not able to be obtained and vitals that have been documented are patient reported.    Cardiac Risk Factors include: advanced age (>72men, >17 women);hypertension     Objective:    Today's Vitals   10/02/23 0931  Weight: 142 lb (64.4 kg)  Height: 4\' 10"  (1.473 m)   Body mass index is 29.68 kg/m.     10/02/2023    9:37 AM 09/24/2022   10:28 AM 07/20/2022   10:06 AM 03/10/2022    8:28 PM 02/10/2022    9:47 AM 09/18/2021   10:25 AM 05/30/2021   10:43 AM  Advanced Directives  Does Patient Have a Medical Advance Directive? Yes Yes No No Yes Yes Yes  Type of Estate agent of [place];Living will Healthcare Power of [place];Living will    Healthcare Power of [place];Living will Living will;Healthcare Power of Attorney  Does patient want to make changes to medical advance directive?     No - Patient declined  No - Patient declined  Copy of Healthcare Power of Attorney in Chart? No - copy requested No - copy requested    No - copy requested No - copy requested    Current Medications (verified) Outpatient Encounter Medications as of 10/02/2023  Medication Sig   aspirin 81 MG tablet Take 81 mg by mouth daily.   citalopram (CELEXA) 10 MG tablet Take 1 tablet (10 mg total) by mouth daily before breakfast.   diltiazem  (CARDIZEM CD) 300 MG 24 hr capsule TAKE ONE CAPSULE BY MOUTH BEFORE BREAKFAST   GLUCOSAMINE-CHONDROITIN PO Take 1 tablet by mouth daily. 1500mg  of glucosamine   lisinopril (ZESTRIL) 30 MG tablet TAKE ONE TABLET BY MOUTH BEFORE BREAKFAST   meloxicam (MOBIC) 15 MG tablet Take 1 tablet (15 mg total) by mouth daily.   Multiple Vitamins-Minerals (MULTIVITAMIN WITH MINERALS) tablet Take 2 tablets by mouth daily. Vitafusion gummie   Polyethyl Glycol-Propyl Glycol 0.4-0.3 % SOLN Apply 1 drop to eye daily.    pravastatin (PRAVACHOL) 40 MG tablet TAKE ONE TABLET BY MOUTH BEFORE BREAKFAST   triamterene-hydrochlorothiazide (DYAZIDE) 37.5-25 MG capsule TAKE ONE CAPSULE BY MOUTH BEFORE BREAKFAST   No facility-administered encounter medications on file as of 10/02/2023.    Allergies (verified) Oxycodone   History: Past Medical History:  Diagnosis Date   Arthritis    Depression    Heart murmur    Hyperlipidemia    Hypertension    Knee joint replacement status, left 2003   PONV (postoperative nausea and vomiting)    Right knee DJD    Past Surgical History:  Procedure Laterality Date   BREAST BIOPSY  2000   marker put in   CHOLECYSTECTOMY  1998   JOINT REPLACEMENT     REPLACEMENT TOTAL KNEE     left knee   TONSILLECTOMY     TOTAL KNEE ARTHROPLASTY  03/23/2012   Procedure: TOTAL KNEE ARTHROPLASTY;  Surgeon: Nilda Simmer,  MD;  Location: [facility] OR;  Service: Orthopedics;  Laterality: Right;  right total knee arthroplasty   Family History  Problem Relation Age of Onset   Hypertension Father    Breast cancer Sister    Social History   Socioeconomic History   Marital status: Widowed    Spouse name: Not on file   Number of children: Not on file   Years of education: Not on file   Highest education level: Not on file  Occupational History   Occupation: retired Interior and spatial designer  Tobacco Use   Smoking status: Former    Current packs/day: 0.00    Average packs/day: 0.1 packs/day for 50.0 years  (5.0 ttl pk-yrs)    Types: Cigarettes    Start date: 03/23/1962    Quit date: 03/23/2012    Years since quitting: 11.5   Smokeless tobacco: Never   Tobacco comments:    social and weekends alcohol  Vaping Use   Vaping status: Never Used  Substance and Sexual Activity   Alcohol use: Yes    Comment: Wine on weekends   Drug use: No   Sexual activity: Not Currently    Partners: Male    Birth control/protection: Post-menopausal  Other Topics Concern   Not on file  Social History Narrative   Regular exercise: walk 2 times a week   Caffeine use: 2 cups of coffee daily         Social Drivers of Health   Financial Resource Strain: Low Risk  (04/02/2023)   Overall Financial Resource Strain (CARDIA)    Difficulty of Paying Living Expenses: Not hard at all  Food Insecurity: No Food Insecurity (10/02/2023)   Hunger Vital Sign    Worried About Running Out of Food in the Last Year: Never true    Ran Out of Food in the Last Year: Never true  Transportation Needs: No Transportation Needs (10/02/2023)   PRAPARE - Administrator, Civil Service (Medical): No    Lack of Transportation (Non-Medical): No  Physical Activity: Insufficiently Active (10/02/2023)   Exercise Vital Sign    Days of Exercise per Week: 7 days    Minutes of Exercise per Session: 20 min  Stress: No Stress Concern Present (10/02/2023)   [employer] of Occupational Health - Occupational Stress Questionnaire    Feeling of Stress : Not at all  Social Connections: Moderately Integrated (10/02/2023)   Social Connection and Isolation Panel [NHANES]    Frequency of Communication with Friends and Family: More than three times a week    Frequency of Social Gatherings with Friends and Family: More than three times a week    Attends Religious Services: More than 4 times per year    Active Member of [employer] or Organizations: Yes    Attends Banker Meetings: More than 4 times per year    Marital Status:  Widowed    Tobacco Counseling Counseling given: Not Answered Tobacco comments: social and weekends alcohol   Clinical Intake:  Pre-visit preparation completed: Yes  Pain : No/denies pain     BMI - recorded: 29.68 Nutritional Status: BMI 25 -29 Overweight Nutritional Risks: None Diabetes: No  How often do you need to have someone help you when you read instructions, pamphlets, or other written materials from your doctor or pharmacy?: 1 - Never  Interpreter Needed?: No  Information entered by :: Theresa Mulligan LPN   Activities of Daily Living    10/02/2023    9:36 AM  In your  present state of health, do you have any difficulty performing the following activities:  Hearing? 0  Vision? 0  Difficulty concentrating or making decisions? 0  Walking or climbing stairs? 1  Comment Uses a Walker  Dressing or bathing? 0  Doing errands, shopping? 0  Preparing Food and eating ? N  Using the Toilet? N  In the past six months, have you accidently leaked urine? N  Do you have problems with loss of bowel control? N  Managing your Medications? N  Managing your Finances? N  Housekeeping or managing your Housekeeping? N    Patient Care Team: Zola Button, Grayling Congress, DO as PCP - General (Family Medicine) Elmon Else, MD as Consulting Physician (Dermatology) Salvatore Marvel, MD as Consulting Physician (Orthopedic Surgery) Nelson Chimes, MD as Consulting Physician (Ophthalmology) Elinor Parkinson, DPM as Consulting Physician (Podiatry) Henrene Pastor, RPH-CPP (Pharmacist)  Indicate any recent Medical Services you may have received from other than [facility] providers in the past year (date may be approximate).     Assessment:   This is a routine wellness examination for Nakesha.  Hearing/Vision screen Hearing Screening - Comments:: Denies hearing difficulties   Vision Screening - Comments:: - up to date with routine eye exams with  [facility]   Goals Addressed                This Visit's Progress     Increase physical activity (pt-stated)        Drink plenty of water.       Depression Screen    10/02/2023    9:35 AM 09/24/2022   10:27 AM 12/25/2021    9:43 AM 09/18/2021   10:28 AM 04/02/2021   10:23 AM 06/23/2018    9:21 AM 06/17/2017    8:51 AM  PHQ 2/9 Scores  PHQ - 2 Score 0 0 0 0 0 0 0    Fall Risk    10/02/2023    9:37 AM 12/05/2022    1:30 PM 09/24/2022   10:25 AM 12/25/2021    9:43 AM 11/23/2021    9:36 AM  Fall Risk   Falls in the past year? 0 0 1 0 1  Number falls in past yr: 0 0 0 0 0  Injury with Fall? 0 0 1 0 1  Risk for fall due to : No Fall Risks Impaired balance/gait;Impaired mobility History of fall(s)  History of fall(s);Orthopedic patient  Follow up Falls prevention discussed  Falls evaluation completed  Falls evaluation completed;Falls prevention discussed    MEDICARE RISK AT HOME: Medicare Risk at Home Any stairs in or around the home?: No If so, are there any without handrails?: No Home free of loose throw rugs in walkways, pet beds, electrical cords, etc?: Yes Adequate lighting in your home to reduce risk of falls?: Yes Life alert?: Yes Use of a cane, walker or w/c?: Yes Grab bars in the bathroom?: Yes Shower chair or bench in shower?: Yes Elevated toilet seat or a handicapped toilet?: Yes  TIMED UP AND GO:  Was the test performed?  No    Cognitive Function:        10/02/2023    9:38 AM 09/24/2022   10:33 AM  6CIT Screen  What Year? 0 points 0 points  What month? 0 points 0 points  What time? 0 points 0 points  Count back from 20 0 points 0 points  Months in reverse 0 points 0 points  Repeat phrase 0 points 0 points  Total Score 0 points 0 points    Immunizations Immunization History  Administered Date(s) Administered   Fluad Quad(high Dose 65+) 06/22/2019, 07/03/2021, 06/27/2022   Influenza, High Dose Seasonal PF 06/22/2014, 06/27/2015, 06/18/2016, 06/17/2017, 06/23/2018   Influenza,inj,Quad PF,6+  Mos 07/01/2013   Influenza-Unspecified 06/14/2014   PFIZER Comirnaty(Gray Top)Covid-19 Tri-Sucrose Vaccine 01/25/2021   PFIZER(Purple Top)SARS-COV-2 Vaccination 11/06/2019, 11/27/2019, 06/23/2020   Pfizer Covid-19 Vaccine Bivalent Booster 17yrs & up 08/28/2021   Pneumococcal Conjugate-13 09/29/2014   Pneumococcal Polysaccharide-23 10/19/2015   Respiratory Syncytial Virus Vaccine,Recomb Aduvanted(Arexvy) 09/19/2022   Tdap 05/30/2021    TDAP status: Up to date  Flu Vaccine status: Declined, Education has been provided regarding the importance of this vaccine but patient still declined. Advised may receive this vaccine at local pharmacy or Health Dept. Aware to provide a copy of the vaccination record if obtained from local pharmacy or Health Dept. Verbalized acceptance and understanding.  Pneumococcal vaccine status: Up to date  Covid-19 vaccine status: Declined, Education has been provided regarding the importance of this vaccine but patient still declined. Advised may receive this vaccine at local pharmacy or Health Dept.or vaccine clinic. Aware to provide a copy of the vaccination record if obtained from local pharmacy or Health Dept. Verbalized acceptance and understanding.  Qualifies for Shingles Vaccine? Yes   Zostavax completed No   Shingrix Completed?: No.    Education has been provided regarding the importance of this vaccine. Patient has been advised to call insurance company to determine out of pocket expense if they have not yet received this vaccine. Advised may also receive vaccine at local pharmacy or Health Dept. Verbalized acceptance and understanding.  Screening Tests Health Maintenance  Topic Date Due   Zoster Vaccines- Shingrix (1 of 2) Never done   DEXA SCAN  09/25/2022   INFLUENZA VACCINE  05/15/2023   COVID-19 Vaccine (6 - 2024-25 season) 06/15/2023   MAMMOGRAM  11/13/2023   Medicare Annual Wellness (AWV)  10/01/2024   DTaP/Tdap/Td (2 - Td or Tdap) 05/31/2031    Pneumonia Vaccine 61+ Years old  Completed   HPV VACCINES  Aged Out    Health Maintenance  Health Maintenance Due  Topic Date Due   Zoster Vaccines- Shingrix (1 of 2) Never done   DEXA SCAN  09/25/2022   INFLUENZA VACCINE  05/15/2023   COVID-19 Vaccine (6 - 2024-25 season) 06/15/2023      Mammogram status: Completed 11/12/22. Repeat every year  Bone Density status: Ordered Deferred. Pt provided with contact info and advised to call to schedule appt.     Additional Screening:    Vision Screening: Recommended annual ophthalmology exams for early detection of glaucoma and other disorders of the eye. Is the patient up to date with their annual eye exam?  Yes  Who is the provider or what is the name of the office in which the patient attends annual eye exams? Dr Hazle Quant If pt is not established with a provider, would they like to be referred to a provider to establish care? No .   Dental Screening: Recommended annual dental exams for proper oral hygiene    Community Resource Referral / Chronic Care Management:  CRR required this visit?  No   CCM required this visit?  No     Plan:     I have personally reviewed and noted the following in the patient's chart:   Medical and social history Use of alcohol, tobacco or illicit drugs  Current medications and supplements including opioid prescriptions. Patient is  not currently taking opioid prescriptions. Functional ability and status Nutritional status Physical activity Advanced directives List of other physicians Hospitalizations, surgeries, and ER visits in previous 12 months Vitals Screenings to include cognitive, depression, and falls Referrals and appointments  In addition, I have reviewed and discussed with patient certain preventive protocols, quality metrics, and best practice recommendations. A written personalized care plan for preventive services as well as general preventive health recommendations were  provided to patient.     Tillie Rung, LPN   16/07/9603   After Visit Summary: (MyChart) Due to this being a telephonic visit, the after visit summary with patients personalized plan was offered to patient via MyChart   Nurse Notes: None

## 2023-11-04 ENCOUNTER — Other Ambulatory Visit: Payer: Self-pay | Admitting: Family Medicine

## 2023-11-04 DIAGNOSIS — Z1231 Encounter for screening mammogram for malignant neoplasm of breast: Secondary | ICD-10-CM

## 2023-11-05 ENCOUNTER — Other Ambulatory Visit: Payer: Self-pay | Admitting: Family Medicine

## 2023-11-05 DIAGNOSIS — M8580 Other specified disorders of bone density and structure, unspecified site: Secondary | ICD-10-CM

## 2023-11-07 ENCOUNTER — Other Ambulatory Visit: Payer: No Typology Code available for payment source

## 2023-11-20 ENCOUNTER — Ambulatory Visit
Admission: RE | Admit: 2023-11-20 | Discharge: 2023-11-20 | Disposition: A | Discharge status: Home / Self Care | Payer: No Typology Code available for payment source | Source: Ambulatory Visit | Attending: Family Medicine | Admitting: Family Medicine

## 2023-11-20 DIAGNOSIS — Z1231 Encounter for screening mammogram for malignant neoplasm of breast: Secondary | ICD-10-CM | POA: Diagnosis not present

## 2023-11-24 DIAGNOSIS — L905 Scar conditions and fibrosis of skin: Secondary | ICD-10-CM | POA: Diagnosis not present

## 2023-11-24 DIAGNOSIS — L820 Inflamed seborrheic keratosis: Secondary | ICD-10-CM | POA: Diagnosis not present

## 2023-12-05 ENCOUNTER — Ambulatory Visit: Payer: No Typology Code available for payment source | Admitting: Family Medicine

## 2023-12-16 ENCOUNTER — Encounter: Payer: Self-pay | Admitting: Family Medicine

## 2023-12-16 ENCOUNTER — Ambulatory Visit (INDEPENDENT_AMBULATORY_CARE_PROVIDER_SITE_OTHER): Payer: No Typology Code available for payment source | Admitting: Family Medicine

## 2023-12-16 VITALS — BP 144/88 | HR 69 | Temp 98.5°F | Resp 16 | Ht <= 58 in | Wt 143.0 lb

## 2023-12-16 DIAGNOSIS — E782 Mixed hyperlipidemia: Secondary | ICD-10-CM | POA: Diagnosis not present

## 2023-12-16 DIAGNOSIS — I10 Essential (primary) hypertension: Secondary | ICD-10-CM

## 2023-12-16 LAB — COMPREHENSIVE METABOLIC PANEL
ALT: 8 U/L (ref 0–35)
AST: 15 U/L (ref 0–37)
Albumin: 4.1 g/dL (ref 3.5–5.2)
Alkaline Phosphatase: 72 U/L (ref 39–117)
BUN: 27 mg/dL — ABNORMAL HIGH (ref 6–23)
CO2: 29 meq/L (ref 19–32)
Calcium: 9.9 mg/dL (ref 8.4–10.5)
Chloride: 101 meq/L (ref 96–112)
Creatinine, Ser: 0.81 mg/dL (ref 0.40–1.20)
GFR: 66.57 mL/min (ref >60.00)
Glucose, Bld: 100 mg/dL — ABNORMAL HIGH (ref 70–99)
Potassium: 4.5 meq/L (ref 3.5–5.1)
Sodium: 138 meq/L (ref 135–145)
Total Bilirubin: 0.7 mg/dL (ref 0.2–1.2)
Total Protein: 6.9 g/dL (ref 6.0–8.3)

## 2023-12-16 LAB — CBC WITH DIFFERENTIAL/PLATELET
Basophils Absolute: 0 10*3/uL (ref 0.0–0.1)
Basophils Relative: 0.5 % (ref 0.0–3.0)
Eosinophils Absolute: 0.1 10*3/uL (ref 0.0–0.7)
Eosinophils Relative: 0.9 % (ref 0.0–5.0)
HCT: 45.8 % (ref 36.0–46.0)
Hemoglobin: 15.5 g/dL — ABNORMAL HIGH (ref 12.0–15.0)
Lymphocytes Relative: 22.2 % (ref 12.0–46.0)
Lymphs Abs: 1.5 10*3/uL (ref 0.7–4.0)
MCHC: 33.8 g/dL (ref 30.0–36.0)
MCV: 90.3 fl (ref 78.0–100.0)
Monocytes Absolute: 0.5 10*3/uL (ref 0.1–1.0)
Monocytes Relative: 7.3 % (ref 3.0–12.0)
Neutro Abs: 4.8 10*3/uL (ref 1.4–7.7)
Neutrophils Relative %: 69.1 % (ref 43.0–77.0)
Platelets: 269 10*3/uL (ref 150.0–400.0)
RBC: 5.08 Mil/uL (ref 3.87–5.11)
RDW: 13.5 % (ref 11.5–15.5)
WBC: 7 10*3/uL (ref 4.0–10.5)

## 2023-12-16 LAB — LIPID PANEL
Cholesterol: 178 mg/dL (ref 0–200)
HDL: 59 mg/dL (ref >39.00)
LDL Cholesterol: 90 mg/dL (ref 0–99)
NonHDL: 119.21
Total CHOL/HDL Ratio: 3
Triglycerides: 148 mg/dL (ref 0.0–149.0)
VLDL: 29.6 mg/dL (ref 0.0–40.0)

## 2023-12-16 LAB — TSH: TSH: 2.92 u[IU]/mL (ref 0.35–5.50)

## 2023-12-16 NOTE — Progress Notes (Signed)
   Established Patient Office Visit  Subjective   Patient ID: Kimberly Edwards, female    DOB: 02-Dec-1940  Age: 83 y.o. MRN: [id number]  Chief Complaint  Patient presents with   Hypertension   Hyperlipidemia   Follow-up    HPI    Review of Systems  Constitutional:  Negative for chills, fever and malaise/fatigue.  HENT:  Negative for congestion and hearing loss.   Eyes:  Negative for blurred vision and discharge.  Respiratory:  Negative for cough, sputum production and shortness of breath.   Cardiovascular:  Negative for chest pain, palpitations and leg swelling.  Gastrointestinal:  Negative for abdominal pain, blood in stool, constipation, diarrhea, heartburn, nausea and vomiting.  Genitourinary:  Negative for dysuria, frequency, hematuria and urgency.  Musculoskeletal:  Negative for back pain, falls and myalgias.  Skin:  Negative for rash.  Neurological:  Negative for dizziness, sensory change, loss of consciousness, weakness and headaches.  Endo/Heme/Allergies:  Negative for environmental allergies. Does not bruise/bleed easily.  Psychiatric/Behavioral:  Negative for depression and suicidal ideas. The patient is not nervous/anxious and does not have insomnia.       Objective:     BP (!) 144/88 (BP Location: Left Arm, Patient Position: Sitting, Cuff Size: Normal)   Pulse 69   Temp 98.5 F (36.9 C) (Oral)   Resp 16   Ht 4\' 10"  (1.473 m)   Wt 143 lb (64.9 kg)   SpO2 97%   BMI 29.89 kg/m    Physical Exam Vitals and nursing note reviewed.  Constitutional:      General: She is not in acute distress.    Appearance: Normal appearance. She is well-developed.  HENT:     Head: Normocephalic and atraumatic.  Eyes:     General: No scleral icterus.       Right eye: No discharge.        Left eye: No discharge.  Cardiovascular:     Rate and Rhythm: Normal rate and regular rhythm.     Heart sounds: No murmur heard. Pulmonary:     Effort: Pulmonary effort is normal. No  respiratory distress.     Breath sounds: Normal breath sounds.  Musculoskeletal:        General: Normal range of motion.     Cervical back: Normal range of motion and neck supple.     Right lower leg: No edema.     Left lower leg: No edema.  Skin:    General: Skin is warm and dry.  Neurological:     Mental Status: She is alert and oriented to person, place, and time.  Psychiatric:        Mood and Affect: Mood normal.        Behavior: Behavior normal.        Thought Content: Thought content normal.        Judgment: Judgment normal.      No results found for any visits on 12/16/23.    The ASCVD Risk score (Arnett DK, et al., 2019) failed to calculate for the following reasons:   The 2019 ASCVD risk score is only valid for ages 59 to 58    Assessment & Plan:   Problem List Items Addressed This Visit   None   No follow-ups on file.    Donato Schultz, DO

## 2023-12-25 ENCOUNTER — Encounter: Payer: Self-pay | Admitting: Family Medicine

## 2024-05-21 ENCOUNTER — Other Ambulatory Visit: Payer: Self-pay | Admitting: Family Medicine

## 2024-05-21 ENCOUNTER — Telehealth: Payer: Self-pay | Admitting: Family Medicine

## 2024-05-21 DIAGNOSIS — E782 Mixed hyperlipidemia: Secondary | ICD-10-CM

## 2024-05-21 DIAGNOSIS — I10 Essential (primary) hypertension: Secondary | ICD-10-CM

## 2024-05-21 MED ORDER — PRAVASTATIN SODIUM 40 MG PO TABS: ORAL_TABLET | ORAL | 1 refills | Status: DC

## 2024-05-21 MED ORDER — LISINOPRIL 30 MG PO TABS: ORAL_TABLET | ORAL | 1 refills | Status: DC

## 2024-05-21 NOTE — Telephone Encounter (Signed)
 Copied from CRM [id number]. Topic: Clinical - Medication Refill >> May 21, 2024 10:14 AM Shereese L wrote: Medication: lisinopril  (ZESTRIL ) 30 MG tablet  pravastatin  (PRAVACHOL ) 40 MG tablet  Has the patient contacted their pharmacy? Yes (Agent: If no, request that the patient contact the pharmacy for the refill. If patient does not wish to contact the pharmacy document the reason why and proceed with request.) (Agent: If yes, when and what did the pharmacy advise?)  This is the patient's preferred pharmacy:   [facility] [place]  - [place], [place] - [address] [place] [address] Phone: [phone] Fax: [phone]  Is this the correct pharmacy for this prescription? Yes If no, delete pharmacy and type the correct one.   Has the prescription been filled recently? Yes  Is the patient out of the medication? Yes  Has the patient been seen for an appointment in the last year OR does the patient have an upcoming appointment? Yes  Can we respond through MyChart? Yes  Agent: Please be advised that Rx refills may take up to 3 business days. We ask that you follow-up with your pharmacy.

## 2024-05-21 NOTE — Telephone Encounter (Unsigned)
 Copied from CRM [id number]. Topic: Clinical - Medication Refill >> May 21, 2024 10:14 AM Shereese L wrote: Medication: lisinopril  (ZESTRIL ) 30 MG tablet  pravastatin  (PRAVACHOL ) 40 MG tablet  Has the patient contacted their pharmacy? Yes (Agent: If no, request that the patient contact the pharmacy for the refill. If patient does not wish to contact the pharmacy document the reason why and proceed with request.) (Agent: If yes, when and what did the pharmacy advise?)  This is the patient's preferred pharmacy:   [facility] - [place]  - [place], [place] - [address] [place] [address] Phone: [phone] Fax: [phone]  Is this the correct pharmacy for this prescription? Yes If no, delete pharmacy and type the correct one.   Has the prescription been filled recently? Yes  Is the patient out of the medication? Yes  Has the patient been seen for an appointment in the last year OR does the patient have an upcoming appointment? Yes  Can we respond through MyChart? Yes  Agent: Please be advised that Rx refills may take up to 3 business days. We ask that you follow-up with your pharmacy.

## 2024-05-21 NOTE — Telephone Encounter (Signed)
 Rx sent    Copied from CRM [id number]. Topic: Clinical - Medication Refill >> May 21, 2024 10:14 AM Shereese L wrote: Medication: lisinopril  (ZESTRIL ) 30 MG tablet  pravastatin  (PRAVACHOL ) 40 MG tablet  Has the patient contacted their pharmacy? Yes (Agent: If no, request that the patient contact the pharmacy for the refill. If patient does not wish to contact the pharmacy document the reason why and proceed with request.) (Agent: If yes, when and what did the pharmacy advise?)  This is the patient's preferred pharmacy:   [facility] [place]  [place], [place] - [address] [place] [address] Phone: [phone] Fax: [phone]  Is this the correct pharmacy for this prescription? Yes If no, delete pharmacy and type the correct one.   Has the prescription been filled recently? Yes  Is the patient out of the medication? Yes  Has the patient been seen for an appointment in the last year OR does the patient have an upcoming appointment? Yes  Can we respond through MyChart? Yes  Agent: Please be advised that Rx refills may take up to 3 business days. We ask that you follow-up with your pharmacy.

## 2024-05-21 NOTE — Telephone Encounter (Signed)
 Refills sent

## 2024-05-27 ENCOUNTER — Other Ambulatory Visit: Payer: Self-pay | Admitting: Family Medicine

## 2024-05-27 DIAGNOSIS — F411 Generalized anxiety disorder: Secondary | ICD-10-CM

## 2024-05-27 DIAGNOSIS — I10 Essential (primary) hypertension: Secondary | ICD-10-CM

## 2024-06-18 ENCOUNTER — Encounter: Payer: Self-pay | Admitting: Family Medicine

## 2024-06-18 ENCOUNTER — Other Ambulatory Visit (HOSPITAL_BASED_OUTPATIENT_CLINIC_OR_DEPARTMENT_OTHER): Payer: Self-pay

## 2024-06-18 ENCOUNTER — Ambulatory Visit: Admitting: Family Medicine

## 2024-06-18 VITALS — BP 160/102 | HR 67 | Temp 98.0°F | Resp 16 | Ht <= 58 in | Wt 143.8 lb

## 2024-06-18 DIAGNOSIS — R011 Cardiac murmur, unspecified: Secondary | ICD-10-CM | POA: Diagnosis not present

## 2024-06-18 DIAGNOSIS — E782 Mixed hyperlipidemia: Secondary | ICD-10-CM

## 2024-06-18 DIAGNOSIS — Z23 Encounter for immunization: Secondary | ICD-10-CM

## 2024-06-18 DIAGNOSIS — I10 Essential (primary) hypertension: Secondary | ICD-10-CM | POA: Diagnosis not present

## 2024-06-18 LAB — COMPREHENSIVE METABOLIC PANEL WITH GFR
ALT: 9 U/L (ref 0–35)
AST: 15 U/L (ref 0–37)
Albumin: 4.1 g/dL (ref 3.5–5.2)
Alkaline Phosphatase: 70 U/L (ref 39–117)
BUN: 24 mg/dL — ABNORMAL HIGH (ref 6–23)
CO2: 28 meq/L (ref 19–32)
Calcium: 9.4 mg/dL (ref 8.4–10.5)
Chloride: 101 meq/L (ref 96–112)
Creatinine, Ser: 0.85 mg/dL (ref 0.40–1.20)
GFR: 62.6 mL/min (ref >60.00)
Glucose, Bld: 85 mg/dL (ref 70–99)
Potassium: 4.5 meq/L (ref 3.5–5.1)
Sodium: 139 meq/L (ref 135–145)
Total Bilirubin: 0.7 mg/dL (ref 0.2–1.2)
Total Protein: 6.8 g/dL (ref 6.0–8.3)

## 2024-06-18 LAB — CBC WITH DIFFERENTIAL/PLATELET
Basophils Absolute: 0 K/uL (ref 0.0–0.1)
Basophils Relative: 0.5 % (ref 0.0–3.0)
Eosinophils Absolute: 0.1 K/uL (ref 0.0–0.7)
Eosinophils Relative: 1 % (ref 0.0–5.0)
HCT: 44.1 % (ref 36.0–46.0)
Hemoglobin: 14.9 g/dL (ref 12.0–15.0)
Lymphocytes Relative: 24.8 % (ref 12.0–46.0)
Lymphs Abs: 1.7 K/uL (ref 0.7–4.0)
MCHC: 33.9 g/dL (ref 30.0–36.0)
MCV: 88 fl (ref 78.0–100.0)
Monocytes Absolute: 0.5 K/uL (ref 0.1–1.0)
Monocytes Relative: 6.9 % (ref 3.0–12.0)
Neutro Abs: 4.7 K/uL (ref 1.4–7.7)
Neutrophils Relative %: 66.8 % (ref 43.0–77.0)
Platelets: 251 K/uL (ref 150.0–400.0)
RBC: 5.01 Mil/uL (ref 3.87–5.11)
RDW: 13.7 % (ref 11.5–15.5)
WBC: 7 K/uL (ref 4.0–10.5)

## 2024-06-18 LAB — LIPID PANEL
Cholesterol: 164 mg/dL (ref 0–200)
HDL: 60.2 mg/dL (ref >39.00)
LDL Cholesterol: 80 mg/dL (ref 0–99)
NonHDL: 104.29
Total CHOL/HDL Ratio: 3
Triglycerides: 119 mg/dL (ref 0.0–149.0)
VLDL: 23.8 mg/dL (ref 0.0–40.0)

## 2024-06-18 MED ORDER — LOSARTAN POTASSIUM 100 MG PO TABS
100.0000 mg | ORAL_TABLET | Freq: Every day | ORAL | 1 refills | Status: DC
Start: 2024-06-18 — End: 2024-07-27

## 2024-06-18 MED ORDER — LOSARTAN POTASSIUM 100 MG PO TABS
100.0000 mg | ORAL_TABLET | Freq: Every day | ORAL | 0 refills | Status: DC
  Filled 2024-06-18: qty 30, 30d supply, fill #0

## 2024-06-18 NOTE — Patient Instructions (Signed)

## 2024-06-18 NOTE — Assessment & Plan Note (Signed)
 Encourage heart healthy diet such as MIND or DASH diet, increase exercise, avoid trans fats, simple carbohydrates and processed foods, consider a krill or fish or flaxseed oil cap daily.

## 2024-06-18 NOTE — Progress Notes (Signed)
 Subjective:    Patient ID: Kimberly Edwards, female    DOB: [birth year], 83 y.o.   MRN: [id number]  Chief Complaint  Patient presents with   Hypertension   Hyperlipidemia   Follow-up    HPI Patient is in today for f/u.  Discussed the use of AI scribe software for clinical note transcription with the patient, who gave verbal consent to proceed.  History of Present Illness Kimberly Edwards is an 83 year old female with hypertension who presents for a follow-up visit.  Two weeks ago, her blood pressure was 130/78 mmHg when checked in her building. She is currently taking lisinopril  for hypertension.  She is taking citalopram  for anxiety and depression, which is effective for her.  She experienced diarrhea last week, which she attributes to food she ate. No nausea or vomiting associated with the diarrhea. No swelling in the ankles.  She is planning a road trip at the end of the month with her daughter to meet friends in [place], which she is looking forward to as she has not seen her friends in a few years.  She received her flu shot and inquires about the need for another RA shot, which she received last year.  She has not had a bone density test recently as her appointment was canceled and has not been rescheduled.    Past Medical History:  Diagnosis Date   Arthritis    Depression    Heart murmur    Hyperlipidemia    Hypertension    Knee joint replacement status, left 2003   PONV (postoperative nausea and vomiting)    Right knee DJD     Past Surgical History:  Procedure Laterality Date   BREAST BIOPSY  2000   marker put in   CHOLECYSTECTOMY  1998   JOINT REPLACEMENT     REPLACEMENT TOTAL KNEE     left knee   TONSILLECTOMY     TOTAL KNEE ARTHROPLASTY  03/23/2012   Procedure: TOTAL KNEE ARTHROPLASTY;  Surgeon: Lamar DELENA Millman, MD;  Location: [facility] OR;  Service: Orthopedics;  Laterality: Right;  right total knee arthroplasty    Family History  Problem Relation Age  of Onset   Hypertension Father    Breast cancer Sister 85   Breast cancer Niece 61    Social History   Socioeconomic History   Marital status: Widowed    Spouse name: Not on file   Number of children: Not on file   Years of education: Not on file   Highest education level: 12th grade  Occupational History   Occupation: retired Interior and spatial designer  Tobacco Use   Smoking status: Former    Current packs/day: 0.00    Average packs/day: 0.1 packs/day for 50.0 years (5.0 ttl pk-yrs)    Types: Cigarettes    Start date: 03/23/1962    Quit date: 03/23/2012    Years since quitting: 12.2   Smokeless tobacco: Never   Tobacco comments:    social and weekends alcohol  Vaping Use   Vaping status: Never Used  Substance and Sexual Activity   Alcohol use: Yes    Comment: Wine on weekends   Drug use: No   Sexual activity: Not Currently    Partners: Male    Birth control/protection: Post-menopausal  Other Topics Concern   Not on file  Social History Narrative   Regular exercise: walk 2 times a week   Caffeine use: 2 cups of coffee daily         Social Drivers  of Health   Financial Resource Strain: Low Risk  (06/11/2024)   Overall Financial Resource Strain (CARDIA)    Difficulty of Paying Living Expenses: Not hard at all  Food Insecurity: No Food Insecurity (06/11/2024)   Hunger Vital Sign    Worried About Running Out of Food in the Last Year: Never true    Ran Out of Food in the Last Year: Never true  Transportation Needs: No Transportation Needs (06/11/2024)   PRAPARE - Administrator, Civil Service (Medical): No    Lack of Transportation (Non-Medical): No  Physical Activity: Insufficiently Active (06/11/2024)   Exercise Vital Sign    Days of Exercise per Week: 3 days    Minutes of Exercise per Session: 20 min  Stress: No Stress Concern Present (06/11/2024)   [employer] of Occupational Health - Occupational Stress Questionnaire    Feeling of Stress: Not at all  Social  Connections: Moderately Isolated (06/11/2024)   Social Connection and Isolation Panel    Frequency of Communication with Friends and Family: More than three times a week    Frequency of Social Gatherings with Friends and Family: More than three times a week    Attends Religious Services: More than 4 times per year    Active Member of [employer] or Organizations: No    Attends Banker Meetings: Not on file    Marital Status: Widowed  Intimate Partner Violence: Not At Risk (10/02/2023)   Humiliation, Afraid, Rape, and Kick questionnaire    Fear of Current or Ex-Partner: No    Emotionally Abused: No    Physically Abused: No    Sexually Abused: No    Outpatient Medications Prior to Visit  Medication Sig Dispense Refill   aspirin 81 MG tablet Take 81 mg by mouth daily.     citalopram  (CELEXA ) 10 MG tablet TAKE 1 TABLET BY MOUTH DAILY BEFORE BREAKFAST 90 tablet 1   diltiazem  (CARDIZEM  CD) 300 MG 24 hr capsule TAKE ONE CAPSULE BY MOUTH BEFORE BREAKFAST 90 capsule 1   GLUCOSAMINE-CHONDROITIN PO Take 1 tablet by mouth daily. 1500mg  of glucosamine     meloxicam  (MOBIC ) 15 MG tablet Take 1 tablet (15 mg total) by mouth daily. 90 tablet 3   Multiple Vitamins-Minerals (MULTIVITAMIN WITH MINERALS) tablet Take 2 tablets by mouth daily. Vitafusion gummie     pravastatin  (PRAVACHOL ) 40 MG tablet TAKE ONE TABLET BY MOUTH BEFORE BREAKFAST 90 tablet 1   triamterene -hydrochlorothiazide  (DYAZIDE ) 37.5-25 MG capsule TAKE ONE CAPSULE BY MOUTH BEFORE BREAKFAST 90 capsule 1   lisinopril  (ZESTRIL ) 30 MG tablet TAKE ONE TABLET BY MOUTH BEFORE BREAKFAST 90 tablet 1   Polyethyl Glycol-Propyl Glycol 0.4-0.3 % SOLN Apply 1 drop to eye daily.      No facility-administered medications prior to visit.    Allergies  Allergen Reactions   Oxycodone  Nausea And Vomiting    ROS     Objective:    Physical Exam Vitals and nursing note reviewed.  Constitutional:      General: She is not in acute distress.     Appearance: Normal appearance. She is well-developed.  HENT:     Head: Normocephalic and atraumatic.  Eyes:     General: No scleral icterus.       Right eye: No discharge.        Left eye: No discharge.  Cardiovascular:     Rate and Rhythm: Normal rate and regular rhythm.     Heart sounds: Murmur heard.  Pulmonary:  Effort: Pulmonary effort is normal. No respiratory distress.     Breath sounds: Normal breath sounds.  Musculoskeletal:        General: Normal range of motion.     Cervical back: Normal range of motion and neck supple.     Right lower leg: No edema.     Left lower leg: No edema.  Skin:    General: Skin is warm and dry.  Neurological:     Mental Status: She is alert and oriented to person, place, and time.  Psychiatric:        Mood and Affect: Mood normal.        Behavior: Behavior normal.        Thought Content: Thought content normal.        Judgment: Judgment normal.     BP (!) 160/102   Pulse 67   Temp 98 F (36.7 C) (Oral)   Resp 16   Ht 4' 10 (1.473 m)   Wt 143 lb 12.8 oz (65.2 kg)   SpO2 96%   BMI 30.05 kg/m  Wt Readings from Last 3 Encounters:  06/18/24 143 lb 12.8 oz (65.2 kg)  12/16/23 143 lb (64.9 kg)  10/02/23 142 lb (64.4 kg)    Diabetic Foot Exam - Simple   No data filed    Lab Results  Component Value Date   WBC 7.0 12/16/2023   HGB 15.5 (H) 12/16/2023   HCT 45.8 12/16/2023   PLT 269.0 12/16/2023   GLUCOSE 100 (H) 12/16/2023   CHOL 178 12/16/2023   TRIG 148.0 12/16/2023   HDL 59.00 12/16/2023   LDLDIRECT 121.0 03/30/2015   LDLCALC 90 12/16/2023   ALT 8 12/16/2023   AST 15 12/16/2023   NA 138 12/16/2023   K 4.5 12/16/2023   CL 101 12/16/2023   CREATININE 0.81 12/16/2023   BUN 27 (H) 12/16/2023   CO2 29 12/16/2023   TSH 2.92 12/16/2023   INR 1.0 03/10/2022   HGBA1C 5.6 06/08/2012    Lab Results  Component Value Date   TSH 2.92 12/16/2023   Lab Results  Component Value Date   WBC 7.0 12/16/2023   HGB 15.5  (H) 12/16/2023   HCT 45.8 12/16/2023   MCV 90.3 12/16/2023   PLT 269.0 12/16/2023   Lab Results  Component Value Date   NA 138 12/16/2023   K 4.5 12/16/2023   CO2 29 12/16/2023   GLUCOSE 100 (H) 12/16/2023   BUN 27 (H) 12/16/2023   CREATININE 0.81 12/16/2023   BILITOT 0.7 12/16/2023   ALKPHOS 72 12/16/2023   AST 15 12/16/2023   ALT 8 12/16/2023   PROT 6.9 12/16/2023   ALBUMIN 4.1 12/16/2023   CALCIUM  9.9 12/16/2023   ANIONGAP 12 03/10/2022   GFR 66.57 12/16/2023   Lab Results  Component Value Date   CHOL 178 12/16/2023   Lab Results  Component Value Date   HDL 59.00 12/16/2023   Lab Results  Component Value Date   LDLCALC 90 12/16/2023   Lab Results  Component Value Date   TRIG 148.0 12/16/2023   Lab Results  Component Value Date   CHOLHDL 3 12/16/2023   Lab Results  Component Value Date   HGBA1C 5.6 06/08/2012       Assessment & Plan:  Need for influenza vaccination -     Flu vaccine HIGH DOSE PF(Fluzone Trivalent)  Essential hypertension -     CBC with Differential/Platelet -     Comprehensive metabolic panel with GFR -  Lipid panel -     Losartan  Potassium; Take 1 tablet (100 mg total) by mouth daily.  Dispense: 90 tablet; Refill: 1  Mixed hyperlipidemia Assessment & Plan: Encourage heart healthy diet such as MIND or DASH diet, increase exercise, avoid trans fats, simple carbohydrates and processed foods, consider a krill or fish or flaxseed oil cap daily.    Orders: -     CBC with Differential/Platelet -     Comprehensive metabolic panel with GFR -     Lipid panel  Cardiac murmur -     ECHOCARDIOGRAM COMPLETE; Future  Primary hypertension Assessment & Plan: Poorly controlled will alter medications, encouraged DASH diet, minimize caffeine and obtain adequate sleep. Report concerning symptoms and follow up as directed and as needed    Other orders -     Losartan  Potassium; Take 1 tablet (100 mg total) by mouth daily.  Dispense: 30  tablet; Refill: 0  Assessment and Plan Assessment & Plan Essential hypertension   Blood pressure is elevated at 160/102 mmHg, up from 130/78 mmHg two weeks ago. Lisinopril  is not effectively controlling it. She is asymptomatic. Discussed risks of uncontrolled hypertension, including potential for stroke. Change lisinopril  to losartan  for better control. Send a 30-day supply of losartan  to the pharmacy downstairs for immediate pickup. Schedule a nurse visit for blood pressure check the week of September 15th before her trip on September 19th.  Cardiac murmur   A quiet cardiac murmur was detected during examination. She recalls being informed about a murmur in the past. No immediate concerns as it is not loud or symptomatic. Order an ultrasound to evaluate the cardiac murmur, but it is not urgent.  Depression and anxiety   Citalopram  is currently being used for management.    Garima Chronis R Lowne Chase, DO

## 2024-06-18 NOTE — Assessment & Plan Note (Signed)
 Poorly controlled will alter medications, encouraged DASH diet, minimize caffeine and obtain adequate sleep. Report concerning symptoms and follow up as directed and as needed

## 2024-06-20 ENCOUNTER — Ambulatory Visit: Payer: Self-pay | Admitting: Family Medicine

## 2024-06-21 ENCOUNTER — Telehealth: Payer: Self-pay

## 2024-06-21 NOTE — Telephone Encounter (Signed)
 Copied from CRM #[id number]. Topic: Clinical - Medication Question >> Jun 18, 2024  2:32 PM Drema MATSU wrote: Reason for CRM: [facility] wants to know if patient is suppose to continue taking pravastatin  (PRAVACHOL ) 40 MG tablet and Lisinopril  30 mg. She states that if still active can a 100 day supply be sent to pharmacy. Pharmacy [facility]

## 2024-06-21 NOTE — Telephone Encounter (Signed)
 I see pravastatin  on med list but not Lisnopril. Please confirm

## 2024-06-22 ENCOUNTER — Other Ambulatory Visit: Payer: No Typology Code available for payment source

## 2024-06-28 ENCOUNTER — Other Ambulatory Visit (HOSPITAL_BASED_OUTPATIENT_CLINIC_OR_DEPARTMENT_OTHER): Payer: Self-pay

## 2024-06-28 ENCOUNTER — Other Ambulatory Visit: Payer: Self-pay

## 2024-06-28 ENCOUNTER — Ambulatory Visit

## 2024-06-28 VITALS — BP 155/77 | HR 59

## 2024-06-28 DIAGNOSIS — I10 Essential (primary) hypertension: Secondary | ICD-10-CM

## 2024-06-28 MED ORDER — TRIAMTERENE-HCTZ 75-50 MG PO TABS: 1.0000 | ORAL_TABLET | Freq: Every day | ORAL | Status: DC

## 2024-06-28 MED ORDER — TRIAMTERENE-HCTZ 75-50 MG PO TABS
1.0000 | ORAL_TABLET | Freq: Every day | ORAL | 0 refills | Status: DC
  Filled 2024-06-28 (×2): qty 30, 30d supply, fill #0

## 2024-06-28 NOTE — Progress Notes (Signed)
 BP Readings from Last 3 Encounters:  06/18/24 (!) 160/102  12/16/23 (!) 144/88  06/05/23 138/82   Patient here for 2 weeks bp check per last ov note 9/05 :  Change lisinopril  to losartan  for better control. Send a 30-day supply of losartan  to the pharmacy downstairs for immediate pickup. Schedule a nurse visit for blood pressure check the week of September 15th before her trip on September 19th.   Patient reports she has followed instructions and taking Losartan  daily as prescribed she also takes triamterene -hydrochlorothiazide  37.5/25 BP today 160/90 pulse of 59  Second BP is 155/77  with a pulse of 62  Per Dr. Evlyn patient was notified her dose for triamterene  -hydrochlorothiazide  will be increased to 75/50.  Prescription was sent and patient scheduled to return in 2 weeks for [place] BP check.

## 2024-06-30 ENCOUNTER — Other Ambulatory Visit: Payer: Self-pay | Admitting: Family Medicine

## 2024-06-30 DIAGNOSIS — M1990 Unspecified osteoarthritis, unspecified site: Secondary | ICD-10-CM

## 2024-07-09 ENCOUNTER — Telehealth: Payer: Self-pay | Admitting: Family Medicine

## 2024-07-09 NOTE — Telephone Encounter (Signed)
 Did a referral for Derm need to placed?

## 2024-07-09 NOTE — Telephone Encounter (Signed)
 Copied from CRM #[id number]. Topic: Referral - Question >> Jul 09, 2024  8:50 AM Montie POUR wrote: Reason for CRM:  Please call Ty about a referral to [facility]. He has questions about a prior authorization and the referral. I could not find a referral in her chart. His number is [phone]. Thanks

## 2024-07-12 ENCOUNTER — Ambulatory Visit (HOSPITAL_COMMUNITY)
Admission: RE | Admit: 2024-07-12 | Discharge: 2024-07-12 | Disposition: A | Source: Ambulatory Visit | Attending: Family Medicine | Admitting: Family Medicine

## 2024-07-12 DIAGNOSIS — R011 Cardiac murmur, unspecified: Secondary | ICD-10-CM | POA: Diagnosis not present

## 2024-07-12 DIAGNOSIS — I358 Other nonrheumatic aortic valve disorders: Secondary | ICD-10-CM | POA: Insufficient documentation

## 2024-07-12 DIAGNOSIS — I119 Hypertensive heart disease without heart failure: Secondary | ICD-10-CM | POA: Insufficient documentation

## 2024-07-12 LAB — ECHOCARDIOGRAM COMPLETE
Area-P 1/2: 1.67 cm2
S' Lateral: 2.1 cm

## 2024-07-12 MED ORDER — PERFLUTREN LIPID MICROSPHERE
1.0000 mL | INTRAVENOUS | Status: AC | PRN
  Administered 2024-07-12: 4 mL via INTRAVENOUS

## 2024-07-14 ENCOUNTER — Other Ambulatory Visit (HOSPITAL_BASED_OUTPATIENT_CLINIC_OR_DEPARTMENT_OTHER): Payer: Self-pay

## 2024-07-14 ENCOUNTER — Ambulatory Visit (INDEPENDENT_AMBULATORY_CARE_PROVIDER_SITE_OTHER)

## 2024-07-14 DIAGNOSIS — I10 Essential (primary) hypertension: Secondary | ICD-10-CM

## 2024-07-14 MED ORDER — TRIAMTERENE-HCTZ 75-50 MG PO TABS
1.0000 | ORAL_TABLET | Freq: Every day | ORAL | 0 refills | Status: DC
  Filled 2024-07-14 – 2024-07-24 (×3): qty 30, 30d supply, fill #0

## 2024-07-14 NOTE — Telephone Encounter (Signed)
 Copied from CRM #[id number]. Topic: General - Call Back - No Documentation >> Jul 14, 2024 11:28 AM Rea BROCKS wrote: Reason for CRM:  Ty- Referral from [facility]   They placed the referral and was hoping to get feedback on whether the visit was accepted or the number of visits to do with the patient.   [phone] phone

## 2024-07-14 NOTE — Progress Notes (Signed)
 Pt here for Blood pressure check per Lowne  Pt currently takes: Diltiazem  300mg ; once daily, Losartan  100mg , once daily, Maxzide  75--50mg ; once daily   Pt reports compliance with medication.  Pt states taking medication about 30 mins prior to visit  BP today @ = 142/90 HR = 74  Recheck-- 138/98  Recheck--138/88    Pt advised per Dr. Frann since numbers are slowly coming down and meds were taken a short time ago. Continue current regime of meds and follow up with Lowne in a couple of weeks. Pt does have blood pressure cuff being delivered in 5-7 days. Discuss when and how to take blood pressures at home. Pt does state having a lot of plans coming up that could be contributing to her blood pressure. Pt scheduled for 2 weeks out.

## 2024-07-25 ENCOUNTER — Other Ambulatory Visit (HOSPITAL_BASED_OUTPATIENT_CLINIC_OR_DEPARTMENT_OTHER): Payer: Self-pay

## 2024-07-26 ENCOUNTER — Other Ambulatory Visit (HOSPITAL_BASED_OUTPATIENT_CLINIC_OR_DEPARTMENT_OTHER): Payer: Self-pay

## 2024-07-27 ENCOUNTER — Other Ambulatory Visit: Payer: Self-pay | Admitting: Family Medicine

## 2024-07-27 ENCOUNTER — Other Ambulatory Visit (HOSPITAL_BASED_OUTPATIENT_CLINIC_OR_DEPARTMENT_OTHER): Payer: Self-pay

## 2024-07-27 MED ORDER — LOSARTAN POTASSIUM 100 MG PO TABS
100.0000 mg | ORAL_TABLET | Freq: Every day | ORAL | 0 refills | Status: DC
  Filled 2024-07-27 (×2): qty 90, 90d supply, fill #0

## 2024-07-29 ENCOUNTER — Encounter: Payer: Self-pay | Admitting: Family Medicine

## 2024-07-29 ENCOUNTER — Other Ambulatory Visit (HOSPITAL_BASED_OUTPATIENT_CLINIC_OR_DEPARTMENT_OTHER): Payer: Self-pay

## 2024-07-29 ENCOUNTER — Ambulatory Visit (INDEPENDENT_AMBULATORY_CARE_PROVIDER_SITE_OTHER): Admitting: Family Medicine

## 2024-07-29 VITALS — BP 136/90 | HR 80 | Temp 97.8°F | Resp 18 | Ht <= 58 in | Wt 140.2 lb

## 2024-07-29 DIAGNOSIS — E782 Mixed hyperlipidemia: Secondary | ICD-10-CM | POA: Diagnosis not present

## 2024-07-29 DIAGNOSIS — I10 Essential (primary) hypertension: Secondary | ICD-10-CM | POA: Diagnosis not present

## 2024-07-29 DIAGNOSIS — F411 Generalized anxiety disorder: Secondary | ICD-10-CM

## 2024-07-29 MED ORDER — CITALOPRAM HYDROBROMIDE 20 MG PO TABS
20.0000 mg | ORAL_TABLET | Freq: Every day | ORAL | 3 refills | Status: DC
  Filled 2024-07-29: qty 30, 30d supply, fill #0

## 2024-07-29 MED ORDER — TRIAMTERENE-HCTZ 75-50 MG PO TABS: 1.0000 | ORAL_TABLET | Freq: Every day | ORAL | 3 refills | Status: AC

## 2024-07-29 NOTE — Assessment & Plan Note (Signed)
Cholesterol--- LDL goal < 100,  HDL >40,  TG < 150.  Diet and exercise will increase HDL and decrease LDL and TG.  Fish,  Fish Oil, Flaxseed oil will also help increase the HDL and decrease Triglycerides.   Recheck labs in 6 months.  

## 2024-07-29 NOTE — Assessment & Plan Note (Signed)
 Poorly controlled will alter medications, encouraged DASH diet, minimize caffeine and obtain adequate sleep. Report concerning symptoms and follow up as directed and as needed

## 2024-07-29 NOTE — Progress Notes (Signed)
 Subjective:    Patient ID: Kimberly Edwards, female    DOB: 1941/09/21, 83 y.o.   MRN: [id number]  Chief Complaint  Patient presents with   Hypertension   Follow-up    HPI Patient is in today for f/u bp.  Discussed the use of AI scribe software for clinical note transcription with the patient, who gave verbal consent to proceed.  History of Present Illness Kimberly Edwards is an 83 year old female who presents for management of hypertension and anxiety.  She has been monitoring her blood pressure at home with a new machine but is unsure of its correct usage. Her recent reading was 136/90 mmHg, and she is particularly concerned about the elevated diastolic number.  She is currently taking Celexa  10 mg for anxiety.  She has lost three pounds since her last visit and has been actively monitoring her weight. She recently picked up a prescription from the pharmacy and is considering getting a flu shot. She declined the COVID vaccine, stating she did not receive it last year either.               Past Medical History:  Diagnosis Date   Arthritis    Depression    Heart murmur    Hyperlipidemia    Hypertension    Knee joint replacement status, left 2003   PONV (postoperative nausea and vomiting)    Right knee DJD     Past Surgical History:  Procedure Laterality Date   BREAST BIOPSY  2000   marker put in   CHOLECYSTECTOMY  1998   JOINT REPLACEMENT     REPLACEMENT TOTAL KNEE     left knee   TONSILLECTOMY     TOTAL KNEE ARTHROPLASTY  03/23/2012   Procedure: TOTAL KNEE ARTHROPLASTY;  Surgeon: Lamar DELENA Millman, MD;  Location: [facility] OR;  Service: Orthopedics;  Laterality: Right;  right total knee arthroplasty    Family History  Problem Relation Age of Onset   Hypertension Father    Breast cancer Sister 18   Breast cancer Niece 3    Social History   Socioeconomic History   Marital status: Widowed    Spouse name: Not on file   Number of  children: Not on file   Years of education: Not on file   Highest education level: Associate degree: occupational, Scientist, product/process development, or vocational program  Occupational History   Occupation: retired Interior and spatial designer  Tobacco Use   Smoking status: Former    Current packs/day: 0.00    Average packs/day: 0.1 packs/day for 50.0 years (5.0 ttl pk-yrs)    Types: Cigarettes    Start date: 03/23/1962    Quit date: 03/23/2012    Years since quitting: 12.3   Smokeless tobacco: Never   Tobacco comments:    social and weekends alcohol  Vaping Use   Vaping status: Never Used  Substance and Sexual Activity   Alcohol use: Yes    Comment: Wine on weekends   Drug use: No   Sexual activity: Not Currently    Partners: Male    Birth control/protection: Post-menopausal  Other Topics Concern   Not on file  Social History Narrative   Regular exercise: walk 2 times a week   Caffeine use: 2 cups of coffee daily         Social Drivers of Health   Financial Resource Strain: Low Risk  (07/28/2024)   Overall Financial Resource Strain (CARDIA)  Difficulty of Paying Living Expenses: Not very hard  Food Insecurity: No Food Insecurity (07/28/2024)   Hunger Vital Sign    Worried About Running Out of Food in the Last Year: Never true    Ran Out of Food in the Last Year: Never true  Transportation Needs: No Transportation Needs (07/28/2024)   PRAPARE - Administrator, Civil Service (Medical): No    Lack of Transportation (Non-Medical): No  Physical Activity: Insufficiently Active (07/28/2024)   Exercise Vital Sign    Days of Exercise per Week: 3 days    Minutes of Exercise per Session: 10 min  Stress: No Stress Concern Present (07/28/2024)   [employer] of Occupational Health - Occupational Stress Questionnaire    Feeling of Stress: Only a little  Social Connections: Moderately Isolated (07/28/2024)   Social Connection and Isolation Panel    Frequency of Communication with Friends and Family:  More than three times a week    Frequency of Social Gatherings with Friends and Family: More than three times a week    Attends Religious Services: More than 4 times per year    Active Member of [employer] or Organizations: No    Attends Banker Meetings: Not on file    Marital Status: Widowed  Intimate Partner Violence: Not At Risk (10/02/2023)   Humiliation, Afraid, Rape, and Kick questionnaire    Fear of Current or Ex-Partner: No    Emotionally Abused: No    Physically Abused: No    Sexually Abused: No    Outpatient Medications Prior to Visit  Medication Sig Dispense Refill   aspirin 81 MG tablet Take 81 mg by mouth daily.     diltiazem  (CARDIZEM  CD) 300 MG 24 hr capsule TAKE ONE CAPSULE BY MOUTH BEFORE BREAKFAST 90 capsule 1   GLUCOSAMINE-CHONDROITIN PO Take 1 tablet by mouth daily. 1500mg  of glucosamine     losartan  (COZAAR ) 100 MG tablet Take 1 tablet (100 mg total) by mouth daily. 90 tablet 0   meloxicam  (MOBIC ) 15 MG tablet TAKE 1 TABLET BY MOUTH DAILY 90 tablet 11   Multiple Vitamins-Minerals (MULTIVITAMIN WITH MINERALS) tablet Take 2 tablets by mouth daily. Vitafusion gummie     pravastatin  (PRAVACHOL ) 40 MG tablet TAKE ONE TABLET BY MOUTH BEFORE BREAKFAST 90 tablet 1   citalopram  (CELEXA ) 10 MG tablet TAKE 1 TABLET BY MOUTH DAILY BEFORE BREAKFAST 90 tablet 1   triamterene -hydrochlorothiazide  (MAXZIDE ) 75-50 MG tablet Take 1 tablet by mouth daily. 30 tablet 0   No facility-administered medications prior to visit.    Allergies  Allergen Reactions   Oxycodone  Nausea And Vomiting    Review of Systems  Constitutional:  Negative for fever and malaise/fatigue.  HENT:  Negative for congestion.   Eyes:  Negative for blurred vision.  Respiratory:  Negative for shortness of breath.   Cardiovascular:  Negative for chest pain, palpitations and leg swelling.  Gastrointestinal:  Negative for abdominal pain, blood in stool and nausea.  Genitourinary:  Negative for dysuria  and frequency.  Musculoskeletal:  Negative for falls.  Skin:  Negative for rash.  Neurological:  Negative for dizziness, loss of consciousness and headaches.  Endo/Heme/Allergies:  Negative for environmental allergies.  Psychiatric/Behavioral:  Negative for depression. The patient is not nervous/anxious.        Objective:    Physical Exam Vitals and nursing note reviewed.  Constitutional:      General: She is not in acute distress.    Appearance: Normal appearance. She is  well-developed.  HENT:     Head: Normocephalic and atraumatic.  Eyes:     General: No scleral icterus.       Right eye: No discharge.        Left eye: No discharge.  Cardiovascular:     Rate and Rhythm: Normal rate and regular rhythm.     Heart sounds: No murmur heard. Pulmonary:     Effort: Pulmonary effort is normal. No respiratory distress.     Breath sounds: Normal breath sounds.  Musculoskeletal:        General: Normal range of motion.     Cervical back: Normal range of motion and neck supple.     Right lower leg: No edema.     Left lower leg: No edema.  Skin:    General: Skin is warm and dry.  Neurological:     Mental Status: She is alert and oriented to person, place, and time.  Psychiatric:        Mood and Affect: Mood normal.        Behavior: Behavior normal.        Thought Content: Thought content normal.        Judgment: Judgment normal.     BP (!) 136/90 (BP Location: Right Arm, Patient Position: Sitting, Cuff Size: Normal)   Pulse 80   Temp 97.8 F (36.6 C) (Oral)   Resp 18   Ht 4' 10 (1.473 m)   Wt 140 lb 3.2 oz (63.6 kg)   SpO2 97%   BMI 29.30 kg/m  Wt Readings from Last 3 Encounters:  07/29/24 140 lb 3.2 oz (63.6 kg)  06/18/24 143 lb 12.8 oz (65.2 kg)  12/16/23 143 lb (64.9 kg)    Diabetic Foot Exam - Simple   No data filed    Lab Results  Component Value Date   WBC 7.0 06/18/2024   HGB 14.9 06/18/2024   HCT 44.1 06/18/2024   PLT 251.0 06/18/2024   GLUCOSE 85  06/18/2024   CHOL 164 06/18/2024   TRIG 119.0 06/18/2024   HDL 60.20 06/18/2024   LDLDIRECT 121.0 03/30/2015   LDLCALC 80 06/18/2024   ALT 9 06/18/2024   AST 15 06/18/2024   NA 139 06/18/2024   K 4.5 06/18/2024   CL 101 06/18/2024   CREATININE 0.85 06/18/2024   BUN 24 (H) 06/18/2024   CO2 28 06/18/2024   TSH 2.92 12/16/2023   INR 1.0 03/10/2022   HGBA1C 5.6 06/08/2012    Lab Results  Component Value Date   TSH 2.92 12/16/2023   Lab Results  Component Value Date   WBC 7.0 06/18/2024   HGB 14.9 06/18/2024   HCT 44.1 06/18/2024   MCV 88.0 06/18/2024   PLT 251.0 06/18/2024   Lab Results  Component Value Date   NA 139 06/18/2024   K 4.5 06/18/2024   CO2 28 06/18/2024   GLUCOSE 85 06/18/2024   BUN 24 (H) 06/18/2024   CREATININE 0.85 06/18/2024   BILITOT 0.7 06/18/2024   ALKPHOS 70 06/18/2024   AST 15 06/18/2024   ALT 9 06/18/2024   PROT 6.8 06/18/2024   ALBUMIN 4.1 06/18/2024   CALCIUM  9.4 06/18/2024   ANIONGAP 12 03/10/2022   GFR 62.60 06/18/2024   Lab Results  Component Value Date   CHOL 164 06/18/2024   Lab Results  Component Value Date   HDL 60.20 06/18/2024   Lab Results  Component Value Date   LDLCALC 80 06/18/2024   Lab Results  Component Value  Date   TRIG 119.0 06/18/2024   Lab Results  Component Value Date   CHOLHDL 3 06/18/2024   Lab Results  Component Value Date   HGBA1C 5.6 06/08/2012       Assessment & Plan:  Essential hypertension -     Triamterene -HCTZ; Take 1 tablet by mouth daily.  Dispense: 90 tablet; Refill: 3  Generalized anxiety disorder -     Citalopram  Hydrobromide; Take 1 tablet (20 mg total) by mouth daily.  Dispense: 30 tablet; Refill: 3  Assessment and Plan Assessment & Plan Elevated blood pressure   Diastolic blood pressure is elevated at 136/90 mmHg, with primary concern for the diastolic value. Anxiety disorder may contribute to this elevation. Increase Celexa  to 20 mg daily to address anxiety, potentially  lowering blood pressure. She should monitor blood pressure at home with a new machine and seek nurse assistance if needed.  Anxiety disorder   Currently managed with Celexa  10 mg daily. Increase dosage to 20 mg daily to better manage anxiety symptoms, which may also positively impact blood pressure levels. Send new prescription to local pharmacy and [facility].  General Health Maintenance   Discussed flu and COVID vaccinations. She agreed to receive the flu shot but declined the COVID vaccination. Administer the flu shot.    Kimberly Kastens R Lowne Chase, DO

## 2024-08-16 ENCOUNTER — Telehealth: Payer: Self-pay

## 2024-08-16 ENCOUNTER — Other Ambulatory Visit: Payer: Self-pay

## 2024-08-16 DIAGNOSIS — F411 Generalized anxiety disorder: Secondary | ICD-10-CM

## 2024-08-16 MED ORDER — CITALOPRAM HYDROBROMIDE 20 MG PO TABS: 20.0000 mg | ORAL_TABLET | Freq: Every day | ORAL | 3 refills | Status: AC

## 2024-08-16 NOTE — Telephone Encounter (Signed)
 Copied from CRM [id number]. Topic: Clinical - Prescription Issue >> Aug 16, 2024  9:22 AM Joesph NOVAK wrote: Reason for CRM: Patient [facility] and she did not receive the correct medication dosage. She states her provider increased the dosage.  Citalopram  20mg .-  She received 10 mg.  Maxzide  75-50 MG - She received 37.5 mg.  She prefers both medications to be sent to [facility]. Please follow up with this patient. [phone].

## 2024-08-16 NOTE — Telephone Encounter (Signed)
 Called [facility] and they never received rx for Celexa  20mg . I sent that rx while on the phone. They may be able to ship it overnight for the patient. Maxzide  75-50mg  - rx was not received they soonest they can fill is tomorrow. They could take 1-2 days to get to patient. I called patient and let her know.

## 2024-08-18 ENCOUNTER — Other Ambulatory Visit: Payer: Self-pay | Admitting: Family Medicine

## 2024-08-18 DIAGNOSIS — E782 Mixed hyperlipidemia: Secondary | ICD-10-CM

## 2024-08-18 MED ORDER — PRAVASTATIN SODIUM 40 MG PO TABS: ORAL_TABLET | ORAL | 1 refills | Status: DC

## 2024-08-18 NOTE — Telephone Encounter (Signed)
 Copied from CRM [id number]. Topic: Clinical - Medication Refill >> Aug 18, 2024 12:06 PM Shereese L wrote: Medication: pravastatin  (PRAVACHOL ) 40 MG tablet  Has the patient contacted their pharmacy? Yes (Agent: If no, request that the patient contact the pharmacy for the refill. If patient does not wish to contact the pharmacy document the reason why and proceed with request.) (Agent: If yes, when and what did the pharmacy advise?)  This is the patient's preferred pharmacy:   [facility] [place]  [place] [place], [place] - [address] [place] [address] Phone: [phone] Fax: [phone]   Is this the correct pharmacy for this prescription? Yes If no, delete pharmacy and type the correct one.   Has the prescription been filled recently? Yes  Is the patient out of the medication? Yes  Has the patient been seen for an appointment in the last year OR does the patient have an upcoming appointment? Yes  Can we respond through MyChart? Yes  Agent: Please be advised that Rx refills may take up to 3 business days. We ask that you follow-up with your pharmacy.

## 2024-08-31 ENCOUNTER — Telehealth (INDEPENDENT_AMBULATORY_CARE_PROVIDER_SITE_OTHER): Admitting: Family Medicine

## 2024-08-31 DIAGNOSIS — I10 Essential (primary) hypertension: Secondary | ICD-10-CM | POA: Diagnosis not present

## 2024-08-31 MED ORDER — LOSARTAN POTASSIUM 100 MG PO TABS: 100.0000 mg | ORAL_TABLET | Freq: Every day | ORAL | 1 refills | Status: AC

## 2024-08-31 NOTE — Progress Notes (Signed)
 Virtual telephone visit    Virtual Visit via Telephone Note  < This visit type was conducted due to national recommendations for restrictions regarding the COVID-19 Pandemic (e.g. social distancing) in an effort to limit this patient's exposure and mitigate transmission in our community. Due to her co-morbid illnesses, this patient is at least at moderate risk for complications without adequate follow up. This format is felt to be most appropriate for this patient at this time. The patient did not have access to video technology or had technical difficulties with video requiring transitioning to audio format only (telephone). Physical exam was limited to content and character of the telephone converstion. Powell was able to get the patient set up on a telephone visit.  Video connection was lost at <50% of the duration of the visit, at which time the remainder of the visit was completed via audio only.  Patient location: home Patient and provider in visit Provider location: Office  I discussed the limitations of evaluation and management by telemedicine and the availability of in person appointments. The patient expressed understanding and agreed to proceed.   Visit Date: 08/31/2024  Today's healthcare provider: Jamee JONELLE Antonio Cyndee, DO     Subjective:    Patient ID: Kimberly Edwards, female    DOB: 1941-09-09, 83 y.o.   MRN: [id number]  No chief complaint on file.   HPI Patient is in today for bp f/u.  Bp running no higher 130s /7 8      Discussed the use of AI scribe software for clinical note transcription with the patient, who gave verbal consent to proceed.  History of Present Illness     Past Medical History:  Diagnosis Date   Arthritis    Depression    Heart murmur    Hyperlipidemia    Hypertension    Knee joint replacement status, left 2003   PONV (postoperative nausea and vomiting)    Right knee DJD     Past Surgical History:  Procedure Laterality Date    BREAST BIOPSY  2000   marker put in   CHOLECYSTECTOMY  1998   JOINT REPLACEMENT     REPLACEMENT TOTAL KNEE     left knee   TONSILLECTOMY     TOTAL KNEE ARTHROPLASTY  03/23/2012   Procedure: TOTAL KNEE ARTHROPLASTY;  Surgeon: Lamar DELENA Millman, MD;  Location: [facility] OR;  Service: Orthopedics;  Laterality: Right;  right total knee arthroplasty    Family History  Problem Relation Age of Onset   Hypertension Father    Breast cancer Sister 70   Breast cancer Niece 45    Social History   Socioeconomic History   Marital status: Widowed    Spouse name: Not on file   Number of children: Not on file   Years of education: Not on file   Highest education level: Associate degree: occupational, scientist, product/process development, or vocational program  Occupational History   Occupation: retired interior and spatial designer  Tobacco Use   Smoking status: Former    Current packs/day: 0.00    Average packs/day: 0.1 packs/day for 50.0 years (5.0 ttl pk-yrs)    Types: Cigarettes    Start date: 03/23/1962    Quit date: 03/23/2012    Years since quitting: 12.4   Smokeless tobacco: Never   Tobacco comments:    social and weekends alcohol  Vaping Use   Vaping status: Never Used  Substance and Sexual Activity   Alcohol use: Yes    Comment: Wine on weekends  Drug use: No   Sexual activity: Not Currently    Partners: Male    Birth control/protection: Post-menopausal  Other Topics Concern   Not on file  Social History Narrative   Regular exercise: walk 2 times a week   Caffeine use: 2 cups of coffee daily         Social Drivers of Health   Financial Resource Strain: Low Risk  (07/28/2024)   Overall Financial Resource Strain (CARDIA)    Difficulty of Paying Living Expenses: Not very hard  Food Insecurity: No Food Insecurity (07/28/2024)   Hunger Vital Sign    Worried About Running Out of Food in the Last Year: Never true    Ran Out of Food in the Last Year: Never true  Transportation Needs: No Transportation Needs (07/28/2024)    PRAPARE - Administrator, Civil Service (Medical): No    Lack of Transportation (Non-Medical): No  Physical Activity: Insufficiently Active (07/28/2024)   Exercise Vital Sign    Days of Exercise per Week: 3 days    Minutes of Exercise per Session: 10 min  Stress: No Stress Concern Present (07/28/2024)   [employer] of Occupational Health - Occupational Stress Questionnaire    Feeling of Stress: Only a little  Social Connections: Moderately Isolated (07/28/2024)   Social Connection and Isolation Panel    Frequency of Communication with Friends and Family: More than three times a week    Frequency of Social Gatherings with Friends and Family: More than three times a week    Attends Religious Services: More than 4 times per year    Active Member of [employer] or Organizations: No    Attends Banker Meetings: Not on file    Marital Status: Widowed  Intimate Partner Violence: Not At Risk (10/02/2023)   Humiliation, Afraid, Rape, and Kick questionnaire    Fear of Current or Ex-Partner: No    Emotionally Abused: No    Physically Abused: No    Sexually Abused: No    Outpatient Medications Prior to Visit  Medication Sig Dispense Refill   aspirin 81 MG tablet Take 81 mg by mouth daily.     citalopram  (CELEXA ) 20 MG tablet Take 1 tablet (20 mg total) by mouth daily. 30 tablet 3   diltiazem  (CARDIZEM  CD) 300 MG 24 hr capsule TAKE ONE CAPSULE BY MOUTH BEFORE BREAKFAST 90 capsule 1   GLUCOSAMINE-CHONDROITIN PO Take 1 tablet by mouth daily. 1500mg  of glucosamine     meloxicam  (MOBIC ) 15 MG tablet TAKE 1 TABLET BY MOUTH DAILY 90 tablet 11   Multiple Vitamins-Minerals (MULTIVITAMIN WITH MINERALS) tablet Take 2 tablets by mouth daily. Vitafusion gummie     pravastatin  (PRAVACHOL ) 40 MG tablet TAKE ONE TABLET BY MOUTH BEFORE BREAKFAST 90 tablet 1   triamterene -hydrochlorothiazide  (MAXZIDE ) 75-50 MG tablet Take 1 tablet by mouth daily. 90 tablet 3   losartan  (COZAAR ) 100  MG tablet Take 1 tablet (100 mg total) by mouth daily. 90 tablet 0   No facility-administered medications prior to visit.    Allergies  Allergen Reactions   Oxycodone  Nausea And Vomiting    Review of Systems  Constitutional:  Negative for fever and malaise/fatigue.  HENT:  Negative for congestion.   Eyes:  Negative for blurred vision.  Respiratory:  Negative for cough and shortness of breath.   Cardiovascular:  Negative for chest pain, palpitations and leg swelling.  Gastrointestinal:  Negative for vomiting.  Musculoskeletal:  Negative for back pain.  Skin:  Negative for rash.  Neurological:  Negative for loss of consciousness and headaches.       Objective:    Physical Exam Vitals and nursing note reviewed.  Constitutional:      Appearance: Normal appearance.  Pulmonary:     Effort: Pulmonary effort is normal.  Neurological:     Mental Status: She is alert.  Psychiatric:        Mood and Affect: Mood normal.        Behavior: Behavior normal.        Thought Content: Thought content normal.        Judgment: Judgment normal.     There were no vitals taken for this visit. Wt Readings from Last 3 Encounters:  07/29/24 140 lb 3.2 oz (63.6 kg)  06/18/24 143 lb 12.8 oz (65.2 kg)  12/16/23 143 lb (64.9 kg)    Diabetic Foot Exam - Simple   No data filed    Lab Results  Component Value Date   WBC 7.0 06/18/2024   HGB 14.9 06/18/2024   HCT 44.1 06/18/2024   PLT 251.0 06/18/2024   GLUCOSE 85 06/18/2024   CHOL 164 06/18/2024   TRIG 119.0 06/18/2024   HDL 60.20 06/18/2024   LDLDIRECT 121.0 03/30/2015   LDLCALC 80 06/18/2024   ALT 9 06/18/2024   AST 15 06/18/2024   NA 139 06/18/2024   K 4.5 06/18/2024   CL 101 06/18/2024   CREATININE 0.85 06/18/2024   BUN 24 (H) 06/18/2024   CO2 28 06/18/2024   TSH 2.92 12/16/2023   INR 1.0 03/10/2022   HGBA1C 5.6 06/08/2012    Lab Results  Component Value Date   TSH 2.92 12/16/2023   Lab Results  Component Value Date    WBC 7.0 06/18/2024   HGB 14.9 06/18/2024   HCT 44.1 06/18/2024   MCV 88.0 06/18/2024   PLT 251.0 06/18/2024   Lab Results  Component Value Date   NA 139 06/18/2024   K 4.5 06/18/2024   CO2 28 06/18/2024   GLUCOSE 85 06/18/2024   BUN 24 (H) 06/18/2024   CREATININE 0.85 06/18/2024   BILITOT 0.7 06/18/2024   ALKPHOS 70 06/18/2024   AST 15 06/18/2024   ALT 9 06/18/2024   PROT 6.8 06/18/2024   ALBUMIN 4.1 06/18/2024   CALCIUM  9.4 06/18/2024   ANIONGAP 12 03/10/2022   GFR 62.60 06/18/2024   Lab Results  Component Value Date   CHOL 164 06/18/2024   Lab Results  Component Value Date   HDL 60.20 06/18/2024   Lab Results  Component Value Date   LDLCALC 80 06/18/2024   Lab Results  Component Value Date   TRIG 119.0 06/18/2024   Lab Results  Component Value Date   CHOLHDL 3 06/18/2024   Lab Results  Component Value Date   HGBA1C 5.6 06/08/2012       Assessment & Plan:  Essential hypertension -     Losartan  Potassium; Take 1 tablet (100 mg total) by mouth daily.  Dispense: 90 tablet; Refill: 1  Primary hypertension Assessment & Plan: Well controlled, no changes to meds. Encouraged heart healthy diet such as the DASH diet and exercise as tolerated.      Assessment & Plan      I discussed the assessment and treatment plan with the patient. The patient was provided an opportunity to ask questions and all were answered. The patient agreed with the plan and demonstrated an understanding of the instructions.   The patient was advised  to call back or seek an in-person evaluation if the symptoms worsen or if the condition fails to improve as anticipated.  I provided 10 minutes of non-face-to-face time during this encounter.   Jamee JONELLE Antonio Cyndee, DO [facility] [phone] (phone) [phone] (fax)  [facility]

## 2024-08-31 NOTE — Assessment & Plan Note (Signed)
 Well controlled, no changes to meds. Encouraged heart healthy diet such as the DASH diet and exercise as tolerated.

## 2024-09-17 ENCOUNTER — Telehealth: Payer: Self-pay | Admitting: Family Medicine

## 2024-09-17 ENCOUNTER — Other Ambulatory Visit: Payer: Self-pay | Admitting: Family

## 2024-09-17 DIAGNOSIS — E782 Mixed hyperlipidemia: Secondary | ICD-10-CM

## 2024-09-17 MED ORDER — PRAVASTATIN SODIUM 40 MG PO TABS: ORAL_TABLET | ORAL | 1 refills | Status: AC

## 2024-09-17 NOTE — Telephone Encounter (Unsigned)
 Copied from CRM #[id number]. Topic: Clinical - Medication Refill >> Sep 17, 2024 10:45 AM Alfonso HERO wrote: Medication: pravastatin  (PRAVACHOL ) 40 MG tablet  Has the patient contacted their pharmacy? Yes (Agent: If no, request that the patient contact the pharmacy for the refill. If patient does not wish to contact the pharmacy document the reason why and proceed with request.) (Agent: If yes, when and what did the pharmacy advise?)  This is the patient's preferred pharmacy:    [facility] - [place]  [address], [place] - [address] [place] [address] Phone: [phone] Fax: [phone]   Is this the correct pharmacy for this prescription? Yes If no, delete pharmacy and type the correct one.   Has the prescription been filled recently? Yes  Is the patient out of the medication? Yes  Has the patient been seen for an appointment in the last year OR does the patient have an upcoming appointment? Yes  Can we respond through MyChart? Yes  Agent: Please be advised that Rx refills may take up to 3 business days. We ask that you follow-up with your pharmacy.

## 2024-11-02 ENCOUNTER — Ambulatory Visit (INDEPENDENT_AMBULATORY_CARE_PROVIDER_SITE_OTHER)

## 2024-11-02 VITALS — Ht <= 58 in | Wt 138.0 lb

## 2024-11-02 DIAGNOSIS — Z0000 Encounter for general adult medical examination without abnormal findings: Secondary | ICD-10-CM | POA: Diagnosis not present

## 2024-11-02 DIAGNOSIS — Z1231 Encounter for screening mammogram for malignant neoplasm of breast: Secondary | ICD-10-CM

## 2024-11-02 NOTE — Patient Instructions (Addendum)
 Kimberly Edwards,  Thank you for taking the time for your Medicare Wellness Visit. I appreciate your continued commitment to your health goals. Please review the care plan we discussed, and feel free to reach out if I can assist you further.  Please note that Annual Wellness Visits do not include a physical exam. Some assessments may be limited, especially if the visit was conducted virtually. If needed, we may recommend an in-person follow-up with your provider.  Ongoing Care Seeing your primary care provider every 3 to 6 months helps us  monitor your health and provide consistent, personalized care.   Referrals If a referral was made during today's visit and you haven't received any updates within two weeks, please contact the referred provider directly to check on the status.  Recommended Screenings:  Health Maintenance  Topic Date Due   Zoster (Shingles) Vaccine (1 of 2) Never done   Osteoporosis screening with Bone Density Scan  09/25/2022   COVID-19 Vaccine (7 - 2025-26 season) 06/14/2024   Breast Cancer Screening  11/19/2024   Medicare Annual Wellness Visit  11/02/2025   DTaP/Tdap/Td vaccine (2 - Td or Tdap) 05/31/2031   Pneumococcal Vaccine for age over 68  Completed   Flu Shot  Completed   Meningitis B Vaccine  Aged Out       11/02/2024   11:34 AM  Advanced Directives  Does Patient Have a Medical Advance Directive? Yes  Type of Estate Agent of [place];Living will  Does patient want to make changes to medical advance directive? No - Patient declined  Copy of Healthcare Power of Attorney in Chart? No - copy requested    Vision: Annual vision screenings are recommended for early detection of glaucoma, cataracts, and diabetic retinopathy. These exams can also reveal signs of chronic conditions such as diabetes and high blood pressure.  Dental: Annual dental screenings help detect early signs of oral cancer, gum disease, and other conditions linked to overall  health, including heart disease and diabetes.  Please see the attached documents for additional preventive care recommendations.

## 2024-11-02 NOTE — Progress Notes (Signed)
 "  Chief Complaint  Patient presents with   Medicare Wellness     Subjective:   Kimberly Edwards is a 84 y.o. female who presents for a [employer] Visit.  Visit info / Clinical Intake: Medicare Wellness Visit Type:: Subsequent Annual Wellness Visit Persons participating in visit and providing information:: patient Medicare Wellness Visit Mode:: Telephone If telephone:: video declined Since this visit was completed virtually, some vitals may be partially provided or unavailable. Missing vitals are due to the limitations of the virtual format.: Documented vitals are patient reported If Telephone or Video please confirm:: I connected with patient using audio/video enable telemedicine. I verified patient identity with two identifiers, discussed telehealth limitations, and patient agreed to proceed. Patient Location:: Home Provider Location:: Office Interpreter Needed?: No Pre-visit prep was completed: yes AWV questionnaire completed by patient prior to visit?: yes Date:: 10/30/24 Living arrangements:: (!) lives alone; in retirement community Patient's Overall Health Status Rating: excellent Typical amount of pain: some Does pain affect daily life?: no Are you currently prescribed opioids?: no  Dietary Habits and Nutritional Risks How many meals a day?: 3 Eats fruit and vegetables daily?: yes Most meals are obtained by: preparing own meals; eating out; having others provide food In the last 2 weeks, have you had any of the following?: none Diabetic:: no  Functional Status Activities of Daily Living (to include ambulation/medication): Independent Ambulation: Independent with device- listed below Home Assistive Devices/Equipment: Eyeglasses; Dentures (specify type); Other (Comment); Walker (specify Type) (Hearing Aids) Medication Administration: Independent Home Management (perform basic housework or laundry): Independent Manage your own finances?: yes Primary  transportation is: driving; family / friends Concerns about vision?: no *vision screening is required for WTM* Concerns about hearing?: (!) yes Uses hearing aids?: (!) yes Hear whispered voice?: (!) no *in-person visit only*  Fall Screening Falls in the past year?: 0 Number of falls in past year: 0 Was there an injury with Fall?: 0 Fall Risk Category Calculator: 0 Patient Fall Risk Level: Low Fall Risk  Fall Risk Patient at Risk for Falls Due to: No Fall Risks Fall risk Follow up: Falls evaluation completed  Home and Transportation Safety: All rugs have non-skid backing?: yes All stairs or steps have railings?: yes Grab bars in the bathtub or shower?: yes Have non-skid surface in bathtub or shower?: yes Good home lighting?: yes Regular seat belt use?: yes Hospital stays in the last year:: no  Cognitive Assessment Difficulty concentrating, remembering, or making decisions? : no Will 6CIT or Mini Cog be Completed: yes What year is it?: 0 points What month is it?: 0 points Give patient an address phrase to remember (5 components): [address] [place]  About what time is it?: 0 points Count backwards from 20 to 1: 0 points Say the months of the year in reverse: 0 points Repeat the address phrase from earlier: 0 points 6 CIT Score: 0 points  Advance Directives (For Healthcare) Does Patient Have a Medical Advance Directive?: Yes Does patient want to make changes to medical advance directive?: No - Patient declined Type of Advance Directive: Healthcare Power of [place]; Living will Copy of Healthcare Power of Attorney in Chart?: No - copy requested Copy of Living Will in Chart?: No - copy requested  Reviewed/Updated  Reviewed/Updated: Reviewed All (Medical, Surgical, Family, Medications, Allergies, Care Teams, Patient Goals)    Allergies (verified) Oxycodone    Current Medications (verified) Outpatient Encounter Medications as of 11/02/2024  Medication Sig    aspirin 81 MG tablet Take  81 mg by mouth daily.   citalopram  (CELEXA ) 20 MG tablet Take 1 tablet (20 mg total) by mouth daily.   diltiazem  (CARDIZEM  CD) 300 MG 24 hr capsule TAKE ONE CAPSULE BY MOUTH BEFORE BREAKFAST   GLUCOSAMINE-CHONDROITIN PO Take 1 tablet by mouth daily. 1500mg  of glucosamine   losartan  (COZAAR ) 100 MG tablet Take 1 tablet (100 mg total) by mouth daily.   meloxicam  (MOBIC ) 15 MG tablet TAKE 1 TABLET BY MOUTH DAILY   Multiple Vitamins-Minerals (MULTIVITAMIN WITH MINERALS) tablet Take 2 tablets by mouth daily. Vitafusion gummie   pravastatin  (PRAVACHOL ) 40 MG tablet TAKE ONE TABLET BY MOUTH BEFORE BREAKFAST   triamterene -hydrochlorothiazide  (MAXZIDE ) 75-50 MG tablet Take 1 tablet by mouth daily.   No facility-administered encounter medications on file as of 11/02/2024.    History: Past Medical History:  Diagnosis Date   Arthritis    Depression    Heart murmur    Hyperlipidemia    Hypertension    Knee joint replacement status, left 2003   PONV (postoperative nausea and vomiting)    Right knee DJD    Past Surgical History:  Procedure Laterality Date   BREAST BIOPSY  2000   marker put in   CHOLECYSTECTOMY  1998   JOINT REPLACEMENT     REPLACEMENT TOTAL KNEE     left knee   TONSILLECTOMY     TOTAL KNEE ARTHROPLASTY  03/23/2012   Procedure: TOTAL KNEE ARTHROPLASTY;  Surgeon: Lamar DELENA Millman, MD;  Location: [facility] OR;  Service: Orthopedics;  Laterality: Right;  right total knee arthroplasty   Family History  Problem Relation Age of Onset   Hypertension Father    Breast cancer Sister 13   Breast cancer Niece 31   Social History   Occupational History   Occupation: retired interior and spatial designer  Tobacco Use   Smoking status: Former    Current packs/day: 0.00    Average packs/day: 0.1 packs/day for 50.0 years (5.0 ttl pk-yrs)    Types: Cigarettes    Start date: 03/23/1962    Quit date: 03/23/2012    Years since quitting: 12.6   Smokeless tobacco: Never   Tobacco  comments:    social and weekends alcohol  Vaping Use   Vaping status: Never Used  Substance and Sexual Activity   Alcohol use: Yes    Comment: Wine on weekends   Drug use: No   Sexual activity: Not Currently    Partners: Male    Birth control/protection: Post-menopausal   Tobacco Counseling Counseling given: No Tobacco comments: social and weekends alcohol  SDOH Screenings   Food Insecurity: No Food Insecurity (11/02/2024)  Housing: Low Risk (11/02/2024)  Transportation Needs: No Transportation Needs (11/02/2024)  Utilities: Not At Risk (11/02/2024)  Alcohol Screen: Low Risk (07/28/2024)  Depression (PHQ2-9): Low Risk (11/02/2024)  Financial Resource Strain: Low Risk (07/28/2024)  Physical Activity: Insufficiently Active (11/02/2024)  Social Connections: Moderately Integrated (11/02/2024)  Stress: No Stress Concern Present (11/02/2024)  Tobacco Use: Medium Risk (11/02/2024)  Health Literacy: Adequate Health Literacy (11/02/2024)   See flowsheets for full screening details  Depression Screen PHQ 2 & 9 Depression Scale- Over the past 2 weeks, how often have you been bothered by any of the following problems? Little interest or pleasure in doing things: 0 Feeling down, depressed, or hopeless (PHQ Adolescent also includes...irritable): 0 PHQ-2 Total Score: 0     Goals Addressed               This Visit's Progress  Increase physical activity (pt-stated)        Remain active!             Objective:    Today's Vitals   11/02/24 1133  Weight: 138 lb (62.6 kg)  Height: 4' 10 (1.473 m)   Body mass index is 28.84 kg/m.  Hearing/Vision screen Hearing Screening - Comments:: Wears Hearing Aids Vision Screening - Comments:: Wears rx glasses - up to date with routine eye exams with  [facility] Immunizations and Health Maintenance Health Maintenance  Topic Date Due   Zoster Vaccines- Shingrix (1 of 2) Never done   Bone Density Scan  09/25/2022   COVID-19  Vaccine (7 - 2025-26 season) 06/14/2024   Mammogram  11/19/2024   Medicare Annual Wellness (AWV)  11/02/2025   DTaP/Tdap/Td (2 - Td or Tdap) 05/31/2031   Pneumococcal Vaccine: 50+ Years  Completed   Influenza Vaccine  Completed   Meningococcal B Vaccine  Aged Out        Assessment/Plan:  This is a routine wellness examination for Kimberly Edwards.  Patient Care Team: Antonio Meth, Jamee SAUNDERS, DO as PCP - General (Family Medicine) Robinson Pao, MD as Consulting Physician (Dermatology) Jane Charleston, MD as Consulting Physician (Orthopedic Surgery) Camillo Golas, MD as Consulting Physician (Ophthalmology) Verta Royden DASEN, DPM as Consulting Physician (Podiatry) Carla Milling, RPH-CPP (Pharmacist)  I have personally reviewed and noted the following in the patients chart:   Medical and social history Use of alcohol, tobacco or illicit drugs  Current medications and supplements including opioid prescriptions. Functional ability and status Nutritional status Physical activity Advanced directives List of other physicians Hospitalizations, surgeries, and ER visits in previous 12 months Vitals Screenings to include cognitive, depression, and falls Referrals and appointments  Orders Placed This Encounter  Procedures   MM 3D SCREENING MAMMOGRAM BILATERAL BREAST    Standing Status:   Future    Expiration Date:   11/02/2025    Reason for Exam (SYMPTOM  OR DIAGNOSIS REQUIRED):   Breast Cancer screening    Preferred imaging location?:   [facility]   In addition, I have reviewed and discussed with patient certain preventive protocols, quality metrics, and best practice recommendations. A written personalized care plan for preventive services as well as general preventive health recommendations were provided to patient.   Rojelio LELON Blush, LPN   [id number]/79/7973   Return in 53 weeks (on 11/08/2025).  After Visit Summary: (MyChart) Due to this being a telephonic visit, the after visit summary with  patients personalized plan was offered to patient via MyChart   Nurse Notes: HM Addressed: Mammogram ordered "

## 2025-01-28 ENCOUNTER — Ambulatory Visit: Admitting: Family Medicine

## 2025-11-08 ENCOUNTER — Ambulatory Visit
